# Patient Record
Sex: Male | Born: 1935 | Race: Black or African American | Hispanic: No | Marital: Married | Smoking: Former smoker
Health system: Southern US, Community
[De-identification: ages and names within clinical notes are randomized; demographics above are authoritative.]

## PROBLEM LIST (undated history)

## (undated) DIAGNOSIS — I10 Essential (primary) hypertension: Secondary | ICD-10-CM

## (undated) DIAGNOSIS — E119 Type 2 diabetes mellitus without complications: Secondary | ICD-10-CM

---

## 2007-09-06 ENCOUNTER — Ambulatory Visit: Payer: Self-pay | Admitting: *Deleted

## 2011-05-22 ENCOUNTER — Emergency Department: Payer: Self-pay | Admitting: Unknown Physician Specialty

## 2014-09-30 ENCOUNTER — Ambulatory Visit: Payer: Self-pay | Admitting: Obstetrics and Gynecology

## 2014-10-16 ENCOUNTER — Inpatient Hospital Stay: Admission: EM | Admit: 2014-10-16 | Payer: Medicare Other | Source: Ambulatory Visit | Admitting: Neurosurgery

## 2014-10-16 ENCOUNTER — Inpatient Hospital Stay (HOSPITAL_COMMUNITY): Payer: Medicare HMO | Admitting: Certified Registered"

## 2014-10-16 ENCOUNTER — Inpatient Hospital Stay (HOSPITAL_COMMUNITY)
Admission: EM | Admit: 2014-10-16 | Discharge: 2014-10-24 | DRG: 025 | Disposition: A | Discharge status: Rehabilitation Facility | Payer: Medicare HMO | Source: Other Acute Inpatient Hospital | Attending: Neurosurgery | Admitting: Neurosurgery

## 2014-10-16 ENCOUNTER — Emergency Department: Payer: Self-pay | Admitting: Emergency Medicine

## 2014-10-16 ENCOUNTER — Encounter (HOSPITAL_COMMUNITY)
Admission: EM | Disposition: A | Discharge status: Rehabilitation Facility | Payer: Self-pay | Source: Other Acute Inpatient Hospital | Attending: Neurosurgery

## 2014-10-16 DIAGNOSIS — R4701 Aphasia: Secondary | ICD-10-CM | POA: Diagnosis not present

## 2014-10-16 DIAGNOSIS — R1319 Other dysphagia: Secondary | ICD-10-CM | POA: Diagnosis not present

## 2014-10-16 DIAGNOSIS — Z794 Long term (current) use of insulin: Secondary | ICD-10-CM

## 2014-10-16 DIAGNOSIS — E43 Unspecified severe protein-calorie malnutrition: Secondary | ICD-10-CM | POA: Diagnosis present

## 2014-10-16 DIAGNOSIS — I6200 Nontraumatic subdural hemorrhage, unspecified: Secondary | ICD-10-CM | POA: Diagnosis not present

## 2014-10-16 DIAGNOSIS — E876 Hypokalemia: Secondary | ICD-10-CM | POA: Diagnosis not present

## 2014-10-16 DIAGNOSIS — I10 Essential (primary) hypertension: Secondary | ICD-10-CM | POA: Diagnosis present

## 2014-10-16 DIAGNOSIS — G9340 Encephalopathy, unspecified: Secondary | ICD-10-CM | POA: Diagnosis present

## 2014-10-16 DIAGNOSIS — Z6823 Body mass index (BMI) 23.0-23.9, adult: Secondary | ICD-10-CM

## 2014-10-16 DIAGNOSIS — S065X0S Traumatic subdural hemorrhage without loss of consciousness, sequela: Secondary | ICD-10-CM | POA: Diagnosis not present

## 2014-10-16 DIAGNOSIS — I4891 Unspecified atrial fibrillation: Secondary | ICD-10-CM

## 2014-10-16 DIAGNOSIS — S065X9A Traumatic subdural hemorrhage with loss of consciousness of unspecified duration, initial encounter: Secondary | ICD-10-CM | POA: Diagnosis present

## 2014-10-16 DIAGNOSIS — S065XAA Traumatic subdural hemorrhage with loss of consciousness status unknown, initial encounter: Secondary | ICD-10-CM

## 2014-10-16 DIAGNOSIS — E119 Type 2 diabetes mellitus without complications: Secondary | ICD-10-CM | POA: Diagnosis present

## 2014-10-16 DIAGNOSIS — I481 Persistent atrial fibrillation: Secondary | ICD-10-CM | POA: Diagnosis not present

## 2014-10-16 DIAGNOSIS — D6489 Other specified anemias: Secondary | ICD-10-CM | POA: Insufficient documentation

## 2014-10-16 DIAGNOSIS — E785 Hyperlipidemia, unspecified: Secondary | ICD-10-CM | POA: Diagnosis not present

## 2014-10-16 DIAGNOSIS — R410 Disorientation, unspecified: Secondary | ICD-10-CM | POA: Diagnosis present

## 2014-10-16 DIAGNOSIS — D649 Anemia, unspecified: Secondary | ICD-10-CM | POA: Diagnosis present

## 2014-10-16 HISTORY — PX: CRANIOTOMY: SHX93

## 2014-10-16 LAB — CBC
HEMATOCRIT: 37.9 % — AB (ref 39.0–52.0)
Hemoglobin: 12.8 g/dL — ABNORMAL LOW (ref 13.0–17.0)
MCH: 28.7 pg (ref 26.0–34.0)
MCHC: 33.8 g/dL (ref 30.0–36.0)
MCV: 85 fL (ref 78.0–100.0)
PLATELETS: 247 10*3/uL (ref 150–400)
RBC: 4.46 MIL/uL (ref 4.22–5.81)
RDW: 13.9 % (ref 11.5–15.5)
WBC: 5.8 10*3/uL (ref 4.0–10.5)

## 2014-10-16 LAB — BASIC METABOLIC PANEL
Anion gap: 10 (ref 5–15)
BUN: 16 mg/dL (ref 6–23)
CALCIUM: 8.5 mg/dL (ref 8.4–10.5)
CO2: 21 mmol/L (ref 19–32)
Chloride: 109 mmol/L (ref 96–112)
Creatinine, Ser: 0.8 mg/dL (ref 0.50–1.35)
GFR calc Af Amer: >90 mL/min (ref >90)
GFR calc non Af Amer: 79 mL/min — ABNORMAL LOW (ref >90)
GLUCOSE: 180 mg/dL — AB (ref 70–99)
Potassium: 3.1 mmol/L — ABNORMAL LOW (ref 3.5–5.1)
Sodium: 140 mmol/L (ref 135–145)

## 2014-10-16 LAB — TYPE AND SCREEN
ABO/RH(D): B POS
Antibody Screen: NEGATIVE

## 2014-10-16 LAB — MRSA PCR SCREENING: MRSA by PCR: NEGATIVE

## 2014-10-16 LAB — GLUCOSE, CAPILLARY
GLUCOSE-CAPILLARY: 218 mg/dL — AB (ref 70–99)
GLUCOSE-CAPILLARY: 172 mg/dL — AB (ref 70–99)
GLUCOSE-CAPILLARY: 218 mg/dL — AB (ref 70–99)
Glucose-Capillary: 200 mg/dL — ABNORMAL HIGH (ref 70–99)

## 2014-10-16 LAB — ABO/RH: ABO/RH(D): B POS

## 2014-10-16 SURGERY — CRANIOTOMY HEMATOMA EVACUATION SUBDURAL
Anesthesia: General | Site: Head | Laterality: Bilateral

## 2014-10-16 MED ORDER — HYDRALAZINE HCL 20 MG/ML IJ SOLN
10.0000 mg | INTRAMUSCULAR | Status: DC | PRN
  Administered 2014-10-17 – 2014-10-21 (×3): 20 mg via INTRAVENOUS
  Filled 2014-10-16: qty 2
  Filled 2014-10-16 (×2): qty 1

## 2014-10-16 MED ORDER — MORPHINE SULFATE 2 MG/ML IJ SOLN
1.0000 mg | INTRAMUSCULAR | Status: DC | PRN
  Administered 2014-10-17: 1 mg via INTRAVENOUS
  Filled 2014-10-16: qty 1

## 2014-10-16 MED ORDER — HYDROCHLOROTHIAZIDE 12.5 MG PO CAPS
12.5000 mg | ORAL_CAPSULE | Freq: Every day | ORAL | Status: DC
  Administered 2014-10-17 – 2014-10-21 (×5): 12.5 mg via ORAL
  Filled 2014-10-16 (×5): qty 1

## 2014-10-16 MED ORDER — NEOSTIGMINE METHYLSULFATE 10 MG/10ML IV SOLN
INTRAVENOUS | Status: AC
  Filled 2014-10-16: qty 1

## 2014-10-16 MED ORDER — ONDANSETRON HCL 4 MG/2ML IJ SOLN: 4.0000 mg | INTRAMUSCULAR | Status: DC | PRN

## 2014-10-16 MED ORDER — SODIUM CHLORIDE 0.9 % IJ SOLN
INTRAMUSCULAR | Status: AC
  Filled 2014-10-16: qty 10

## 2014-10-16 MED ORDER — LEVETIRACETAM 500 MG PO TABS
500.0000 mg | ORAL_TABLET | Freq: Two times a day (BID) | ORAL | Status: DC
  Administered 2014-10-16 – 2014-10-24 (×16): 500 mg via ORAL
  Filled 2014-10-16 (×18): qty 1

## 2014-10-16 MED ORDER — PROMETHAZINE HCL 25 MG PO TABS: 12.5000 mg | ORAL_TABLET | ORAL | Status: DC | PRN

## 2014-10-16 MED ORDER — CEFAZOLIN SODIUM 1-5 GM-% IV SOLN
1.0000 g | Freq: Three times a day (TID) | INTRAVENOUS | Status: AC
  Administered 2014-10-16 – 2014-10-17 (×2): 1 g via INTRAVENOUS
  Filled 2014-10-16 (×2): qty 50

## 2014-10-16 MED ORDER — ACETAMINOPHEN 650 MG RE SUPP: 650.0000 mg | RECTAL | Status: DC | PRN

## 2014-10-16 MED ORDER — INSULIN NPH (HUMAN) (ISOPHANE) 100 UNIT/ML ~~LOC~~ SUSP
4.0000 [IU] | Freq: Every day | SUBCUTANEOUS | Status: DC
  Administered 2014-10-17: 4 [IU] via SUBCUTANEOUS
  Filled 2014-10-16: qty 10

## 2014-10-16 MED ORDER — PRAVASTATIN SODIUM 20 MG PO TABS
10.0000 mg | ORAL_TABLET | Freq: Every day | ORAL | Status: DC
  Administered 2014-10-17 – 2014-10-23 (×7): 10 mg via ORAL
  Filled 2014-10-16 (×8): qty 1

## 2014-10-16 MED ORDER — INSULIN ASPART 100 UNIT/ML ~~LOC~~ SOLN
0.0000 [IU] | SUBCUTANEOUS | Status: DC
  Administered 2014-10-16: 3 [IU] via SUBCUTANEOUS
  Administered 2014-10-17: 2 [IU] via SUBCUTANEOUS
  Administered 2014-10-17: 3 [IU] via SUBCUTANEOUS

## 2014-10-16 MED ORDER — NEOSTIGMINE METHYLSULFATE 10 MG/10ML IV SOLN
INTRAVENOUS | Status: DC | PRN
  Administered 2014-10-16: 4 mg via INTRAVENOUS

## 2014-10-16 MED ORDER — FENTANYL CITRATE 0.05 MG/ML IJ SOLN
INTRAMUSCULAR | Status: AC
  Filled 2014-10-16: qty 5

## 2014-10-16 MED ORDER — PROPOFOL 10 MG/ML IV BOLUS
INTRAVENOUS | Status: AC
  Filled 2014-10-16: qty 20

## 2014-10-16 MED ORDER — ONDANSETRON HCL 4 MG/2ML IJ SOLN
INTRAMUSCULAR | Status: AC
  Filled 2014-10-16: qty 2

## 2014-10-16 MED ORDER — INSULIN ASPART 100 UNIT/ML ~~LOC~~ SOLN: 0.0000 [IU] | Freq: Three times a day (TID) | SUBCUTANEOUS | Status: DC

## 2014-10-16 MED ORDER — PROPOFOL 10 MG/ML IV BOLUS
INTRAVENOUS | Status: DC | PRN
  Administered 2014-10-16: 100 mg via INTRAVENOUS

## 2014-10-16 MED ORDER — INSULIN NPH (HUMAN) (ISOPHANE) 100 UNIT/ML ~~LOC~~ SUSP
8.0000 [IU] | Freq: Every day | SUBCUTANEOUS | Status: DC
  Administered 2014-10-16: 8 [IU] via SUBCUTANEOUS
  Filled 2014-10-16: qty 10

## 2014-10-16 MED ORDER — CHLORHEXIDINE GLUCONATE 0.12 % MT SOLN
OROMUCOSAL | Status: AC
  Administered 2014-10-16: 15 mL
  Filled 2014-10-16: qty 15

## 2014-10-16 MED ORDER — AMLODIPINE BESYLATE 10 MG PO TABS
10.0000 mg | ORAL_TABLET | Freq: Every day | ORAL | Status: DC
  Administered 2014-10-17 – 2014-10-24 (×8): 10 mg via ORAL
  Filled 2014-10-16 (×8): qty 1

## 2014-10-16 MED ORDER — BISACODYL 10 MG RE SUPP
10.0000 mg | Freq: Every day | RECTAL | Status: DC | PRN
  Administered 2014-10-18: 10 mg via RECTAL
  Filled 2014-10-16 (×3): qty 1

## 2014-10-16 MED ORDER — FLEET ENEMA 7-19 GM/118ML RE ENEM
1.0000 | ENEMA | Freq: Once | RECTAL | Status: AC | PRN
  Filled 2014-10-16: qty 1

## 2014-10-16 MED ORDER — SUCCINYLCHOLINE CHLORIDE 20 MG/ML IJ SOLN
INTRAMUSCULAR | Status: DC | PRN
  Administered 2014-10-16: 100 mg via INTRAVENOUS

## 2014-10-16 MED ORDER — NICARDIPINE HCL IN NACL 20-0.86 MG/200ML-% IV SOLN
INTRAVENOUS | Status: AC
  Administered 2014-10-16: 5 mg/h
  Filled 2014-10-16: qty 200

## 2014-10-16 MED ORDER — SODIUM CHLORIDE 0.9 % IV SOLN
INTRAVENOUS | Status: DC | PRN
  Administered 2014-10-16: 18:00:00 via INTRAVENOUS

## 2014-10-16 MED ORDER — LIDOCAINE HCL (CARDIAC) 20 MG/ML IV SOLN
INTRAVENOUS | Status: DC | PRN
  Administered 2014-10-16: 100 mg via INTRAVENOUS

## 2014-10-16 MED ORDER — POTASSIUM CHLORIDE 10 MEQ/100ML IV SOLN
10.0000 meq | INTRAVENOUS | Status: AC
  Administered 2014-10-16 – 2014-10-17 (×4): 10 meq via INTRAVENOUS
  Filled 2014-10-16 (×4): qty 100

## 2014-10-16 MED ORDER — ONDANSETRON HCL 4 MG PO TABS: 4.0000 mg | ORAL_TABLET | ORAL | Status: DC | PRN

## 2014-10-16 MED ORDER — LIDOCAINE HCL (CARDIAC) 20 MG/ML IV SOLN
INTRAVENOUS | Status: AC
  Filled 2014-10-16: qty 5

## 2014-10-16 MED ORDER — ARTIFICIAL TEARS OP OINT
TOPICAL_OINTMENT | OPHTHALMIC | Status: DC | PRN
Start: 2014-10-16 — End: 2014-10-16
  Administered 2014-10-16: 1 via OPHTHALMIC

## 2014-10-16 MED ORDER — ROCURONIUM BROMIDE 50 MG/5ML IV SOLN
INTRAVENOUS | Status: AC
  Filled 2014-10-16: qty 2

## 2014-10-16 MED ORDER — PANTOPRAZOLE SODIUM 40 MG IV SOLR
40.0000 mg | Freq: Every day | INTRAVENOUS | Status: DC
  Administered 2014-10-16: 40 mg via INTRAVENOUS
  Filled 2014-10-16 (×2): qty 40

## 2014-10-16 MED ORDER — LISINOPRIL 20 MG PO TABS
20.0000 mg | ORAL_TABLET | Freq: Every day | ORAL | Status: DC
  Administered 2014-10-16 – 2014-10-21 (×6): 20 mg via ORAL
  Filled 2014-10-16 (×6): qty 1

## 2014-10-16 MED ORDER — SODIUM CHLORIDE 0.9 % IV SOLN
INTRAVENOUS | Status: DC
Start: 2014-10-16 — End: 2014-10-24
  Administered 2014-10-17 – 2014-10-22 (×3): via INTRAVENOUS
  Administered 2014-10-23: 1000 mL via INTRAVENOUS

## 2014-10-16 MED ORDER — ROCURONIUM BROMIDE 100 MG/10ML IV SOLN
INTRAVENOUS | Status: DC | PRN
  Administered 2014-10-16: 20 mg via INTRAVENOUS

## 2014-10-16 MED ORDER — POLYETHYLENE GLYCOL 3350 17 G PO PACK
17.0000 g | PACK | Freq: Every day | ORAL | Status: DC | PRN
  Administered 2014-10-18 – 2014-10-23 (×3): 17 g via ORAL
  Filled 2014-10-16 (×5): qty 1

## 2014-10-16 MED ORDER — PHENYLEPHRINE HCL 10 MG/ML IJ SOLN
INTRAMUSCULAR | Status: AC
  Filled 2014-10-16: qty 1

## 2014-10-16 MED ORDER — HYDRALAZINE HCL 10 MG PO TABS
20.0000 mg | ORAL_TABLET | Freq: Two times a day (BID) | ORAL | Status: DC
  Administered 2014-10-16 – 2014-10-24 (×16): 20 mg via ORAL
  Filled 2014-10-16 (×16): qty 2

## 2014-10-16 MED ORDER — BUPIVACAINE HCL (PF) 0.25 % IJ SOLN
INTRAMUSCULAR | Status: DC | PRN
  Administered 2014-10-16: 7.5 mL

## 2014-10-16 MED ORDER — LIDOCAINE HCL (CARDIAC) 20 MG/ML IV SOLN
INTRAVENOUS | Status: AC
  Filled 2014-10-16: qty 10

## 2014-10-16 MED ORDER — POTASSIUM CHLORIDE IN NACL 20-0.9 MEQ/L-% IV SOLN
INTRAVENOUS | Status: DC
  Administered 2014-10-16: 23:00:00 via INTRAVENOUS
  Filled 2014-10-16 (×2): qty 1000

## 2014-10-16 MED ORDER — INSULIN NPH (HUMAN) (ISOPHANE) 100 UNIT/ML ~~LOC~~ SUSP: 4.0000 [IU] | Freq: Every day | SUBCUTANEOUS | Status: DC

## 2014-10-16 MED ORDER — NICARDIPINE HCL IN NACL 20-0.86 MG/200ML-% IV SOLN
INTRAVENOUS | Status: DC | PRN
  Administered 2014-10-16: 5 mg/h via INTRAVENOUS

## 2014-10-16 MED ORDER — DEXAMETHASONE SODIUM PHOSPHATE 10 MG/ML IJ SOLN
INTRAMUSCULAR | Status: AC
  Filled 2014-10-16: qty 1

## 2014-10-16 MED ORDER — LIDOCAINE-EPINEPHRINE 1 %-1:100000 IJ SOLN
INTRAMUSCULAR | Status: DC | PRN
  Administered 2014-10-16: 7.5 mL

## 2014-10-16 MED ORDER — SENNA 8.6 MG PO TABS
1.0000 | ORAL_TABLET | Freq: Two times a day (BID) | ORAL | Status: DC
  Administered 2014-10-16 – 2014-10-24 (×9): 8.6 mg via ORAL
  Filled 2014-10-16 (×17): qty 1

## 2014-10-16 MED ORDER — LABETALOL HCL 5 MG/ML IV SOLN
INTRAVENOUS | Status: DC | PRN
  Administered 2014-10-16: 10 mg via INTRAVENOUS

## 2014-10-16 MED ORDER — METFORMIN HCL 500 MG PO TABS
1000.0000 mg | ORAL_TABLET | Freq: Two times a day (BID) | ORAL | Status: DC
  Administered 2014-10-17 – 2014-10-18 (×3): 1000 mg via ORAL
  Filled 2014-10-16 (×5): qty 2

## 2014-10-16 MED ORDER — INSULIN ASPART 100 UNIT/ML ~~LOC~~ SOLN: 0.0000 [IU] | Freq: Every day | SUBCUTANEOUS | Status: DC

## 2014-10-16 MED ORDER — FENTANYL CITRATE 0.05 MG/ML IJ SOLN
INTRAMUSCULAR | Status: DC | PRN
  Administered 2014-10-16: 50 ug via INTRAVENOUS

## 2014-10-16 MED ORDER — ACETAMINOPHEN 325 MG PO TABS
650.0000 mg | ORAL_TABLET | ORAL | Status: DC | PRN
  Administered 2014-10-17 – 2014-10-22 (×11): 650 mg via ORAL
  Filled 2014-10-16 (×12): qty 2

## 2014-10-16 MED ORDER — ROCURONIUM BROMIDE 50 MG/5ML IV SOLN
INTRAVENOUS | Status: AC
  Filled 2014-10-16: qty 1

## 2014-10-16 MED ORDER — LABETALOL HCL 5 MG/ML IV SOLN: 10.0000 mg | INTRAVENOUS | Status: DC | PRN

## 2014-10-16 MED ORDER — LISINOPRIL-HYDROCHLOROTHIAZIDE 20-12.5 MG PO TABS: 2.0000 | ORAL_TABLET | Freq: Every day | ORAL | Status: DC

## 2014-10-16 MED ORDER — MIDAZOLAM HCL 2 MG/2ML IJ SOLN
INTRAMUSCULAR | Status: AC
  Filled 2014-10-16: qty 2

## 2014-10-16 MED ORDER — THROMBIN 20000 UNITS EX SOLR
CUTANEOUS | Status: DC | PRN
  Administered 2014-10-16: 20 mL via TOPICAL

## 2014-10-16 MED ORDER — GLYCOPYRROLATE 0.2 MG/ML IJ SOLN
INTRAMUSCULAR | Status: DC | PRN
  Administered 2014-10-16: 0.4 mg via INTRAVENOUS

## 2014-10-16 MED ORDER — GLYCOPYRROLATE 0.2 MG/ML IJ SOLN
INTRAMUSCULAR | Status: AC
  Filled 2014-10-16: qty 3

## 2014-10-16 MED ORDER — ARTIFICIAL TEARS OP OINT
TOPICAL_OINTMENT | OPHTHALMIC | Status: AC
  Filled 2014-10-16: qty 3.5

## 2014-10-16 MED ORDER — HYDROCODONE-ACETAMINOPHEN 5-325 MG PO TABS
1.0000 | ORAL_TABLET | ORAL | Status: DC | PRN
  Administered 2014-10-17 – 2014-10-24 (×6): 1 via ORAL
  Filled 2014-10-16 (×6): qty 1

## 2014-10-16 MED ORDER — 0.9 % SODIUM CHLORIDE (POUR BTL) OPTIME
TOPICAL | Status: DC | PRN
  Administered 2014-10-16 (×3): 1000 mL

## 2014-10-16 MED ORDER — CEFAZOLIN SODIUM-DEXTROSE 2-3 GM-% IV SOLR
INTRAVENOUS | Status: DC | PRN
  Administered 2014-10-16: 2 g via INTRAVENOUS

## 2014-10-16 MED ORDER — ONDANSETRON HCL 4 MG/2ML IJ SOLN
INTRAMUSCULAR | Status: AC
  Filled 2014-10-16: qty 4

## 2014-10-16 MED ORDER — DOCUSATE SODIUM 100 MG PO CAPS
100.0000 mg | ORAL_CAPSULE | Freq: Two times a day (BID) | ORAL | Status: DC
  Administered 2014-10-16 – 2014-10-24 (×11): 100 mg via ORAL
  Filled 2014-10-16 (×17): qty 1

## 2014-10-16 SURGICAL SUPPLY — 76 items
BANDAGE GAUZE 4  KLING STR (GAUZE/BANDAGES/DRESSINGS) IMPLANT
BENZOIN TINCTURE PRP APPL 2/3 (GAUZE/BANDAGES/DRESSINGS) IMPLANT
BIT DRILL WIRE PASS 1.3MM (BIT) IMPLANT
BNDG GAUZE ELAST 4 BULKY (GAUZE/BANDAGES/DRESSINGS) IMPLANT
BRUSH SCRUB EZ PLAIN DRY (MISCELLANEOUS) ×3 IMPLANT
BUR ACORN 6.0 PRECISION (BURR) ×2 IMPLANT
BUR ACORN 6.0MM PRECISION (BURR) ×1
BUR ROUTER D-58 CRANI (BURR) IMPLANT
CANISTER SUCT 3000ML PPV (MISCELLANEOUS) ×3 IMPLANT
CATH ROBINSON RED A/P 12FR (CATHETERS) IMPLANT
CLIP TI MEDIUM 6 (CLIP) IMPLANT
CONT SPEC 4OZ CLIKSEAL STRL BL (MISCELLANEOUS) ×3 IMPLANT
DECANTER SPIKE VIAL GLASS SM (MISCELLANEOUS) ×3 IMPLANT
DRAIN JACKSON PRATT 10MM FLAT (MISCELLANEOUS) ×6 IMPLANT
DRAIN PENROSE 1/2X12 LTX STRL (WOUND CARE) IMPLANT
DRAIN SNY WOU 7FLT (WOUND CARE) IMPLANT
DRAPE NEUROLOGICAL W/INCISE (DRAPES) ×3 IMPLANT
DRAPE SURG IRRIG POUCH 19X23 (DRAPES) IMPLANT
DRAPE WARM FLUID 44X44 (DRAPE) ×3 IMPLANT
DRILL WIRE PASS 1.3MM (BIT)
DRSG OPSITE 4X5.5 SM (GAUZE/BANDAGES/DRESSINGS) ×3 IMPLANT
DRSG OPSITE POSTOP 3X4 (GAUZE/BANDAGES/DRESSINGS) ×6 IMPLANT
DRSG PAD ABDOMINAL 8X10 ST (GAUZE/BANDAGES/DRESSINGS) IMPLANT
DRSG TELFA 3X8 NADH (GAUZE/BANDAGES/DRESSINGS) ×3 IMPLANT
DURAPREP 6ML APPLICATOR 50/CS (WOUND CARE) ×3 IMPLANT
ELECT CAUTERY BLADE 6.4 (BLADE) ×3 IMPLANT
ELECT REM PT RETURN 9FT ADLT (ELECTROSURGICAL) ×3
ELECTRODE REM PT RTRN 9FT ADLT (ELECTROSURGICAL) ×1 IMPLANT
EVACUATOR SILICONE 100CC (DRAIN) ×6 IMPLANT
GAUZE SPONGE 4X4 12PLY STRL (GAUZE/BANDAGES/DRESSINGS) IMPLANT
GAUZE SPONGE 4X4 16PLY XRAY LF (GAUZE/BANDAGES/DRESSINGS) IMPLANT
GLOVE BIO SURGEON STRL SZ8 (GLOVE) ×3 IMPLANT
GLOVE BIOGEL PI IND STRL 8 (GLOVE) ×1 IMPLANT
GLOVE BIOGEL PI IND STRL 8.5 (GLOVE) ×1 IMPLANT
GLOVE BIOGEL PI INDICATOR 8 (GLOVE) ×2
GLOVE BIOGEL PI INDICATOR 8.5 (GLOVE) ×2
GLOVE ECLIPSE 7.5 STRL STRAW (GLOVE) ×3 IMPLANT
GLOVE EXAM NITRILE LRG STRL (GLOVE) IMPLANT
GLOVE EXAM NITRILE MD LF STRL (GLOVE) IMPLANT
GLOVE EXAM NITRILE XL STR (GLOVE) IMPLANT
GLOVE EXAM NITRILE XS STR PU (GLOVE) IMPLANT
GOWN STRL REUS W/ TWL LRG LVL3 (GOWN DISPOSABLE) ×1 IMPLANT
GOWN STRL REUS W/ TWL XL LVL3 (GOWN DISPOSABLE) ×1 IMPLANT
GOWN STRL REUS W/TWL 2XL LVL3 (GOWN DISPOSABLE) IMPLANT
GOWN STRL REUS W/TWL LRG LVL3 (GOWN DISPOSABLE) ×2
GOWN STRL REUS W/TWL XL LVL3 (GOWN DISPOSABLE) ×2
HEMOSTAT SURGICEL 2X14 (HEMOSTASIS) ×3 IMPLANT
KIT BASIN OR (CUSTOM PROCEDURE TRAY) ×3 IMPLANT
KIT ROOM TURNOVER OR (KITS) ×3 IMPLANT
NEEDLE HYPO 25X1 1.5 SAFETY (NEEDLE) ×3 IMPLANT
NS IRRIG 1000ML POUR BTL (IV SOLUTION) ×3 IMPLANT
PACK CRANIOTOMY (CUSTOM PROCEDURE TRAY) ×3 IMPLANT
PAD ARMBOARD 7.5X6 YLW CONV (MISCELLANEOUS) ×3 IMPLANT
PATTIES SURGICAL .5 X.5 (GAUZE/BANDAGES/DRESSINGS) IMPLANT
PATTIES SURGICAL .5 X3 (DISPOSABLE) IMPLANT
PATTIES SURGICAL 1X1 (DISPOSABLE) IMPLANT
PIN MAYFIELD SKULL DISP (PIN) IMPLANT
PLATE 1.5  2HOLE LNG NEURO (Plate) ×12 IMPLANT
PLATE 1.5 2HOLE LNG NEURO (Plate) ×6 IMPLANT
SCREW SELF DRILL HT 1.5/4MM (Screw) ×36 IMPLANT
SPECIMEN JAR SMALL (MISCELLANEOUS) IMPLANT
SPONGE NEURO XRAY DETECT 1X3 (DISPOSABLE) IMPLANT
SPONGE SURGIFOAM ABS GEL 12-7 (HEMOSTASIS) IMPLANT
STAPLER SKIN PROX WIDE 3.9 (STAPLE) ×3 IMPLANT
SUT ETHILON 3 0 FSL (SUTURE) ×6 IMPLANT
SUT ETHILON 3 0 PS 1 (SUTURE) IMPLANT
SUT NURALON 4 0 TR CR/8 (SUTURE) ×6 IMPLANT
SUT VIC AB 2-0 CP2 18 (SUTURE) ×6 IMPLANT
SUT VIC AB 3-0 SH 8-18 (SUTURE) IMPLANT
SYR CONTROL 10ML LL (SYRINGE) ×3 IMPLANT
TOWEL OR 17X24 6PK STRL BLUE (TOWEL DISPOSABLE) ×3 IMPLANT
TOWEL OR 17X26 10 PK STRL BLUE (TOWEL DISPOSABLE) ×3 IMPLANT
TRAP SPECIMEN MUCOUS 40CC (MISCELLANEOUS) IMPLANT
TRAY FOLEY CATH 14FRSI W/METER (CATHETERS) IMPLANT
UNDERPAD 30X30 INCONTINENT (UNDERPADS AND DIAPERS) IMPLANT
WATER STERILE IRR 1000ML POUR (IV SOLUTION) ×3 IMPLANT

## 2014-10-16 NOTE — Progress Notes (Signed)
Awake, alert, follows commands briskly.  Speaking with nurse, not with me.  Son says "this is normal for Dad."  Doing well following drainage of bilateral subdural hematomas.

## 2014-10-16 NOTE — Consult Note (Signed)
PULMONARY / CRITICAL CARE MEDICINE   Name: Mason Stevens MRN: [id number] DOB: 04/16/1927    ADMISSION DATE:  10/16/2014 CONSULTATION DATE:  10/16/2014  REFERRING MD :  [facility] ED  CHIEF COMPLAINT:  SDH  INITIAL PRESENTATION:  81 y.o. M [facility] for dizziness and falling to his R side.  In ED, found to have bilateral SDH and was [facility]. He underwent evacuation later that afternoon Venetia Maxon(Stern) and was extubated in PACU before returning to ICU.  PCCM consulted for medical management..     STUDIES:  MRI Head 2/18 >>> Bilateral SDH (Going off OR notes as [facility], error message each time).  SIGNIFICANT EVENTS: 2/18 - [facility] with [facility], found to have B SDH, transferred to [facility] and underwent B craniotomy and evacuation of hematoma in OR   HISTORY OF PRESENT ILLNESS:  Pt is encephalopathic; therefore, this HPI is obtained from chart review. Mason [name] Arora is a 81 y.o. M who [facility] for dizziness and falling towards his R side x 1 month.  He had also been experiencing some slurred speech and diffuse weakness.  In ED, he was found to have bilateral SDH and was [facility].  Later that afternoon, he was taken to the OR and had bilateral craniotomy and evacuation of SDH (Dr. Venetia MaxonStern).  Following the procedure, he was successfully extubated in the PACU before returning to neuro ICU. PCCM was consulted for medical management.  PAST MEDICAL HISTORY :  HTN, HLD, DM, ? A-fib.   Prior to Admission medications   Medication Sig Start Date End Date Taking? Authorizing Provider  amLODipine (NORVASC) 10 MG tablet Take 10 mg by mouth daily.   Yes Historical Provider, MD  hydrALAZINE (APRESOLINE) 10 MG tablet Take 20 mg by mouth 2 (two) times daily.   Yes Historical Provider, MD  insulin NPH Human (HUMULIN N,NOVOLIN N) 100 UNIT/ML injection  Inject 4-8 Units into the skin daily. Take 4 units every morning Take 8 units every evening   Yes Historical Provider, MD  lisinopril-hydrochlorothiazide (PRINZIDE,ZESTORETIC) 20-12.5 MG per tablet Take 2 tablets by mouth daily.   Yes Historical Provider, MD  lovastatin (MEVACOR) 20 MG tablet Take 40 mg by mouth at bedtime.   Yes Historical Provider, MD  metFORMIN (GLUCOPHAGE) 1000 MG tablet Take 1,000 mg by mouth 2 (two) times daily with a meal.   Yes Historical Provider, MD   No Known Allergies  FAMILY HISTORY:  No family history on file.  SOCIAL HISTORY:  has no tobacco, alcohol, and drug history on file.  REVIEW OF SYSTEMS:  Unable to obtain as pt is encephalopathic.  SUBJECTIVE:   VITAL SIGNS: Temp:  [97.3 F (36.3 C)-98.3 F (36.8 C)] 97.3 F (36.3 C) (02/18 1952) Pulse Rate:  [45-103] 71 (02/18 1952) Resp:  [14-19] 18 (02/18 1952) BP: (117-134)/(48-73) 134/67 mmHg (02/18 1700) SpO2:  [96 %-100 %] 100 % (02/18 1952) Arterial Line BP: (169)/(61) 169/61 mmHg (02/18 1700) HEMODYNAMICS:   VENTILATOR SETTINGS:   INTAKE / OUTPUT: Intake/Output      02/18 0701 - 02/19 0700   I.V. 1111.8   Total Intake 1111.8   Urine 150   Total Output 150   Net +961.8       Urine Occurrence 1 x     PHYSICAL EXAMINATION: General: Elderly chronically ill appearing male, in NAD. Neuro: Opens eyes, MAE's, does not  follow commands. HEENT: Bilateral scalp dressings and drains in place and C/D/I.   PERRL, sclerae anicteric. Cardiovascular: IRIR, no M/R/G.  Lungs: Respirations shallow and unlabored.  CTA bilaterally, No W/R/R. Abdomen: BS x 4, soft, NT/ND.  Musculoskeletal: No gross deformities, no edema.  Skin: Intact, warm, no rashes.  LABS:  CBC  Recent Labs Lab 10/16/14 1625  WBC 5.8  HGB 12.8*  HCT 37.9*  PLT 247   Coag's No results for input(s): APTT, INR in the last 168 hours. BMET  Recent Labs Lab 10/16/14 1625  NA 140  K 3.1*  CL 109  CO2 21  BUN 16   CREATININE 0.80  GLUCOSE 180*   Electrolytes  Recent Labs Lab 10/16/14 1625  CALCIUM 8.5   Sepsis Markers No results for input(s): LATICACIDVEN, PROCALCITON, O2SATVEN in the last 168 hours. ABG No results for input(s): PHART, PCO2ART, PO2ART in the last 168 hours. Liver Enzymes No results for input(s): AST, ALT, ALKPHOS, BILITOT, ALBUMIN in the last 168 hours. Cardiac Enzymes No results for input(s): TROPONINI, PROBNP in the last 168 hours. Glucose  Recent Labs Lab 10/16/14 1551  GLUCAP 218*    Imaging   ASSESSMENT / PLAN:  PULMONARY A: At risk post op atelectasis P:   Pulmonary hygiene. Incentive spirometry.  CARDIOVASCULAR L radial Aline 2/18 >>> A:  A.fib - rate controlled.  Unclear if chronic or not, not on any medications as outpatient Strict BP control Hx HTN, HLD. P:  Hold anticoagulation given recent surgical intervention. SBP goal < 160. Hydralazine PRN. Hold outpatient amlodipine, hydralazine, lovastatin.  RENAL A:   Hypokalemia P:   Potassium repletion x 4 runs. NS @ 75. BMP in AM.  GASTROINTESTINAL A:   Nutrition P:   NPO for now. SLP swallow eval in AM.  HEMATOLOGIC A:   Anemia VTE Prophylaxis P:  Transfuse per usual ICU guidelines. SCD's only. CBC in AM.  INFECTIOUS A:   No indication for infection P:   Monitor clinically.  ENDOCRINE A:   DM P:   CBG's q4hr. SSI.  NEUROLOGIC A:   Bilateral SDH s/p bilateral craniotomy and evacuation 2/18 P:   Post op care per neurosurgery. PT consult in AM.   Family updated: None.  Interdisciplinary Family Meeting v Palliative Care Meeting:  Due by: 2/24.  CC time: 35 minutes.   Rutherford Guys, [place] [facility] [place] [facility] Pager: [phone]  or [phone] 10/16/2014, 8:19 PM

## 2014-10-16 NOTE — Anesthesia Procedure Notes (Signed)
Procedure Name: Intubation Date/Time: 10/16/2014 6:42 PM Performed by: Izora Gala Pre-anesthesia Checklist: Patient identified, Emergency Drugs available, Suction available and Patient being monitored Patient Re-evaluated:Patient Re-evaluated prior to inductionOxygen Delivery Method: Circle system utilized Intubation Type: IV induction Ventilation: Mask ventilation without difficulty Laryngoscope Size: Miller and 3 Grade View: Grade II Tube type: Oral Tube size: 7.5 mm Number of attempts: 1 Airway Equipment and Method: Stylet and LTA kit utilized Placement Confirmation: ETT inserted through vocal cords under direct vision,  positive ETCO2 and breath sounds checked- equal and bilateral Secured at: 23 cm Tube secured with: Tape Dental Injury: Teeth and Oropharynx as per pre-operative assessment

## 2014-10-16 NOTE — Progress Notes (Signed)
Notified Dr. Venetia MaxonStern re BP. Order to go by A-line and to treat BP over 160.

## 2014-10-16 NOTE — Brief Op Note (Signed)
10/16/2014  7:48 PM  PATIENT:  Mason Stevens  81 y.o. male  PRE-OPERATIVE DIAGNOSIS:  Bilateral Subdural Hematoma  POST-OPERATIVE DIAGNOSIS:  Bilateral Subdural Hematoma  PROCEDURE:  Procedure(s) with comments: BILATERALCRANIOTOMY HEMATOMA EVACUATION SUBDURAL (Bilateral) - BILATERALCRANIOTOMY HEMATOMA EVACUATION SUBDURAL  SURGEON:  Surgeon(s) and Role:    * Maeola HarmanJoseph Timmy Bubeck, MD - Primary  PHYSICIAN ASSISTANT:   ASSISTANTS: none   ANESTHESIA:   general  EBL:     BLOOD ADMINISTERED:none  DRAINS: (10) Jackson-Pratt drain(s) with closed bulb suction in the subdural space bilaterally   LOCAL MEDICATIONS USED:  LIDOCAINE   SPECIMEN:  No Specimen  DISPOSITION OF SPECIMEN:  N/A  COUNTS:  YES  TOURNIQUET:  * No tourniquets in log *  DICTATION: DICTATION: Patient is 81 year old man who has developed bilateral subdural hematomas. He has had progressively worsening headache and CT shows bilateral subdural hematoma with mass effect. It was elected to take patient to surgery for bilateral craniotomies for SDH.  Procedure: Following smooth intubation, patient was placed in brow up position. Head was placed on donut head holder and bi-frontal scalp was shaved and prepped and draped in usual sterile fashion. Area of planned incision was infiltrated with lidocaine. A ilinear incision was made and carried through temporalis fascia and muscle to expose calvarium on each side of his head. Skull flaps was elevated exposing subdural hematoma. Dura was opened and subdural was evacuated. The subdurals were large and under pressure. Both subdural cavities were irrigated with saline until significantly clearer.  Hemostasis was assured. The brain was considerably more relaxed after hematoma evacuation. Bilateral #10 JP drains were placed and anchored with nylon sutures. Bone flaps was replaced with plates, the fascia and galea were closed with 2-0 vicryl sutures and the skin was re approximated with  staples. Sterile occlusive dressings were placed. Patient was returned to a supine position and transferred to the ICU in stable and satisfactory condition. Counts were correct at the end of the case.   PLAN OF CARE: Admit to inpatient   PATIENT DISPOSITION:  PACU - hemodynamically stable.   Delay start of Pharmacological VTE agent (>24hrs) due to surgical blood loss or risk of bleeding: yes

## 2014-10-16 NOTE — Op Note (Signed)
10/16/2014  7:48 PM  PATIENT:  Mason Stevens  81 y.o. male  PRE-OPERATIVE DIAGNOSIS:  Bilateral Subdural Hematoma  POST-OPERATIVE DIAGNOSIS:  Bilateral Subdural Hematoma  PROCEDURE:  Procedure(s) with comments: BILATERALCRANIOTOMY HEMATOMA EVACUATION SUBDURAL (Bilateral) - BILATERALCRANIOTOMY HEMATOMA EVACUATION SUBDURAL  SURGEON:  Surgeon(s) and Role:    * Trueman Worlds, MD - Primary  PHYSICIAN ASSISTANT:   ASSISTANTS: none   ANESTHESIA:   general  EBL:     BLOOD ADMINISTERED:none  DRAINS: (10) Jackson-Pratt drain(s) with closed bulb suction in the subdural space bilaterally   LOCAL MEDICATIONS USED:  LIDOCAINE   SPECIMEN:  No Specimen  DISPOSITION OF SPECIMEN:  N/A  COUNTS:  YES  TOURNIQUET:  * No tourniquets in log *  DICTATION: DICTATION: Patient is 81 year old man who has developed bilateral subdural hematomas. He has had progressively worsening headache and CT shows bilateral subdural hematoma with mass effect. It was elected to take patient to surgery for bilateral craniotomies for SDH.  Procedure: Following smooth intubation, patient was placed in brow up position. Head was placed on donut head holder and bi-frontal scalp was shaved and prepped and draped in usual sterile fashion. Area of planned incision was infiltrated with lidocaine. A ilinear incision was made and carried through temporalis fascia and muscle to expose calvarium on each side of his head. Skull flaps was elevated exposing subdural hematoma. Dura was opened and subdural was evacuated. The subdurals were large and under pressure. Both subdural cavities were irrigated with saline until significantly clearer.  Hemostasis was assured. The brain was considerably more relaxed after hematoma evacuation. Bilateral #10 JP drains were placed and anchored with nylon sutures. Bone flaps was replaced with plates, the fascia and galea were closed with 2-0 vicryl sutures and the skin was re approximated with  staples. Sterile occlusive dressings were placed. Patient was returned to a supine position and transferred to the ICU in stable and satisfactory condition. Counts were correct at the end of the case.   PLAN OF CARE: Admit to inpatient   PATIENT DISPOSITION:  PACU - hemodynamically stable.   Delay start of Pharmacological VTE agent (>24hrs) due to surgical blood loss or risk of bleeding: yes  

## 2014-10-16 NOTE — Transfer of Care (Signed)
Immediate Anesthesia Transfer of Care Note  Patient: Mason Stevens  Procedure(s) Performed: Procedure(s) with comments: BILATERALCRANIOTOMY HEMATOMA EVACUATION SUBDURAL (Bilateral) - BILATERALCRANIOTOMY HEMATOMA EVACUATION SUBDURAL  Patient Location: PACU  Anesthesia Type:General  Level of Consciousness: awake, patient cooperative and confused  Airway & Oxygen Therapy: Patient Spontanous Breathing and Patient connected to face mask oxygen  Post-op Assessment: Report given to RN, Post -op Vital signs reviewed and stable, Patient moving all extremities, Patient moving all extremities X 4 and Patient able to stick tongue midline  Post vital signs: Reviewed and stable  Last Vitals:  Filed Vitals:   10/16/14 1700  BP: 134/67  Pulse: 45  Temp:   Resp: 16    Complications: No apparent anesthesia complications

## 2014-10-16 NOTE — H&P (Signed)
Reason for Admission:Confusion and bilateral subdural hematomas Referring Physician: Irena ReichmannYao  Mason Stevens is an 81 y.o. male.  HPI: 3 week history of progressively worsening confusion, falling and balance problems.  Patient quite confused this morning and family called EMS and [facility].  Son has noticed progressively worsening confusion and aphasia.  Patient has been independent until very recently and has fallen and hit his head on multiple occasions.  No past medical history on file.  No past surgical history on file.  No family history on file.  Social History:  has no tobacco, alcohol, and drug history on file.  Allergies: No Known Allergies  Medications: I have reviewed the patient's current medications.  Results for orders placed or performed during the hospital encounter of 10/16/14 (from the past 48 hour(s))  MRSA PCR Screening     Status: None   Collection Time: 10/16/14 12:45 PM  Result Value Ref Range   MRSA by PCR NEGATIVE NEGATIVE    Comment:        The GeneXpert MRSA Assay (FDA approved for NASAL specimens only), is one component of a comprehensive MRSA colonization surveillance program. It is not intended to diagnose MRSA infection nor to guide or monitor treatment for MRSA infections.   Glucose, capillary     Status: Abnormal   Collection Time: 10/16/14  3:51 PM  Result Value Ref Range   Glucose-Capillary 218 (H) 70 - 99 mg/dL  CBC     Status: Abnormal   Collection Time: 10/16/14  4:25 PM  Result Value Ref Range   WBC 5.8 4.0 - 10.5 K/uL   RBC 4.46 4.22 - 5.81 MIL/uL   Hemoglobin 12.8 (L) 13.0 - 17.0 g/dL   HCT 40.937.9 (L) 81.139.0 - 91.452.0 %   MCV 85.0 78.0 - 100.0 fL   MCH 28.7 26.0 - 34.0 pg   MCHC 33.8 30.0 - 36.0 g/dL   RDW 78.213.9 95.611.5 - 21.315.5 %   Platelets 247 150 - 400 K/uL    No results found.  Review of Systems - Negative except diabetes, new onset A Fib    Blood pressure 117/48, pulse 90, temperature 98.3 F (36.8 C),  temperature source Oral, resp. rate 17, SpO2 99 %. Physical Exam  Constitutional: He appears well-developed and well-nourished. He appears listless.  HENT:  Head: Normocephalic and atraumatic.  Eyes: Conjunctivae and EOM are normal. Pupils are equal, round, and reactive to light.  Neck: Normal range of motion. Neck supple.  Cardiovascular: Normal heart sounds.   Respiratory: Effort normal and breath sounds normal.  GI: Soft. Bowel sounds are normal.  Musculoskeletal: Normal range of motion.  Neurological: He has normal strength. He appears listless. No cranial nerve deficit or sensory deficit. Coordination abnormal. GCS eye subscore is 4. GCS verbal subscore is 4. GCS motor subscore is 6.    Assessment/Plan: Patient has large bilateral subdural hematomasw with worsening confusion and new onset A Fib. Emergent bilateral craniotomies for SDH.  I discussed situation with patient and his family.  Mason Stevens,Shatyra Becka D, MD 10/16/2014, 5:17 PM

## 2014-10-16 NOTE — Anesthesia Preprocedure Evaluation (Addendum)
Anesthesia Evaluation  Patient identified by MRN, date of birth, ID band Patient confused    Reviewed: Allergy & Precautions, H&P , NPO status , Patient's Chart, lab work & pertinent test results  Airway Mallampati: II  TM Distance: >3 FB Neck ROM: Full    Dental no notable dental hx. (+) Teeth Intact, Dental Advisory Given   Pulmonary neg pulmonary ROS,  breath sounds clear to auscultation  Pulmonary exam normal       Cardiovascular hypertension, On Medications Atrial Fibrillation Rhythm:Irregular Rate:Normal     Neuro/Psych negative neurological ROS  negative psych ROS   GI/Hepatic negative GI ROS, Neg liver ROS,   Endo/Other  Type 2, Oral Hypoglycemic Agents  Renal/GU negative Renal ROS  negative genitourinary   Musculoskeletal negative musculoskeletal ROS (+)   Abdominal   Peds negative pediatric ROS (+)  Hematology negative hematology ROS (+)   Anesthesia Other Findings   Reproductive/Obstetrics negative OB ROS                            Anesthesia Physical Anesthesia Plan  ASA: III and emergent  Anesthesia Plan: General   Post-op Pain Management:    Induction: Intravenous  Airway Management Planned: Oral ETT  Additional Equipment: Arterial line  Intra-op Plan:   Post-operative Plan: Extubation in OR and Possible Post-op intubation/ventilation  Informed Consent: I have reviewed the patients History and Physical, chart, labs and discussed the procedure including the risks, benefits and alternatives for the proposed anesthesia with the patient or authorized representative who has indicated his/her understanding and acceptance.   Dental advisory given  Plan Discussed with: CRNA  Anesthesia Plan Comments:        Anesthesia Quick Evaluation

## 2014-10-16 NOTE — Anesthesia Postprocedure Evaluation (Signed)
  Anesthesia Post-op Note  Patient: Mason Stevens  Procedure(s) Performed: Procedure(s) with comments: BILATERALCRANIOTOMY HEMATOMA EVACUATION SUBDURAL (Bilateral) - BILATERALCRANIOTOMY HEMATOMA EVACUATION SUBDURAL  Patient Location: ICU  Anesthesia Type:General  Level of Consciousness: awake and alert   Airway and Oxygen Therapy: Patient Spontanous Breathing  Post-op Pain: none  Post-op Assessment: Post-op Vital signs reviewed  Post-op Vital Signs: Reviewed  Last Vitals:  Filed Vitals:   10/16/14 2017  BP: 104/85  Pulse: 51  Temp:   Resp: 23    Complications: No apparent anesthesia complications

## 2014-10-17 ENCOUNTER — Inpatient Hospital Stay (HOSPITAL_COMMUNITY): Payer: Medicare HMO

## 2014-10-17 ENCOUNTER — Encounter (HOSPITAL_COMMUNITY): Payer: Self-pay | Admitting: *Deleted

## 2014-10-17 DIAGNOSIS — I4891 Unspecified atrial fibrillation: Secondary | ICD-10-CM

## 2014-10-17 DIAGNOSIS — E43 Unspecified severe protein-calorie malnutrition: Secondary | ICD-10-CM | POA: Insufficient documentation

## 2014-10-17 LAB — CBC
HEMATOCRIT: 34.5 % — AB (ref 39.0–52.0)
Hemoglobin: 11.6 g/dL — ABNORMAL LOW (ref 13.0–17.0)
MCH: 29.1 pg (ref 26.0–34.0)
MCHC: 33.6 g/dL (ref 30.0–36.0)
MCV: 86.5 fL (ref 78.0–100.0)
PLATELETS: 230 10*3/uL (ref 150–400)
RBC: 3.99 MIL/uL — AB (ref 4.22–5.81)
RDW: 14 % (ref 11.5–15.5)
WBC: 6.7 10*3/uL (ref 4.0–10.5)

## 2014-10-17 LAB — GLUCOSE, CAPILLARY
GLUCOSE-CAPILLARY: 199 mg/dL — AB (ref 70–99)
GLUCOSE-CAPILLARY: 179 mg/dL — AB (ref 70–99)
GLUCOSE-CAPILLARY: 163 mg/dL — AB (ref 70–99)
Glucose-Capillary: 132 mg/dL — ABNORMAL HIGH (ref 70–99)
Glucose-Capillary: 133 mg/dL — ABNORMAL HIGH (ref 70–99)
Glucose-Capillary: 204 mg/dL — ABNORMAL HIGH (ref 70–99)
Glucose-Capillary: 194 mg/dL — ABNORMAL HIGH (ref 70–99)

## 2014-10-17 LAB — BASIC METABOLIC PANEL
Anion gap: 6 (ref 5–15)
BUN: 16 mg/dL (ref 6–23)
CHLORIDE: 111 mmol/L (ref 96–112)
CO2: 22 mmol/L (ref 19–32)
Calcium: 8.3 mg/dL — ABNORMAL LOW (ref 8.4–10.5)
Creatinine, Ser: 0.9 mg/dL (ref 0.50–1.35)
GFR calc Af Amer: 87 mL/min — ABNORMAL LOW (ref >90)
GFR calc non Af Amer: 75 mL/min — ABNORMAL LOW (ref >90)
Glucose, Bld: 156 mg/dL — ABNORMAL HIGH (ref 70–99)
POTASSIUM: 3.6 mmol/L (ref 3.5–5.1)
Sodium: 139 mmol/L (ref 135–145)

## 2014-10-17 LAB — TSH: TSH: 0.592 u[IU]/mL (ref 0.350–4.500)

## 2014-10-17 LAB — TROPONIN I: Troponin I: 0.03 ng/mL (ref <0.031)

## 2014-10-17 LAB — MAGNESIUM: MAGNESIUM: 1.8 mg/dL (ref 1.5–2.5)

## 2014-10-17 LAB — PHOSPHORUS: PHOSPHORUS: 2.4 mg/dL (ref 2.3–4.6)

## 2014-10-17 MED ORDER — POTASSIUM CHLORIDE CRYS ER 20 MEQ PO TBCR
40.0000 meq | EXTENDED_RELEASE_TABLET | Freq: Once | ORAL | Status: AC
  Administered 2014-10-17: 40 meq via ORAL
  Filled 2014-10-17: qty 2

## 2014-10-17 MED ORDER — MAGNESIUM SULFATE IN D5W 10-5 MG/ML-% IV SOLN
1.0000 g | Freq: Once | INTRAVENOUS | Status: AC
  Administered 2014-10-17: 1 g via INTRAVENOUS
  Filled 2014-10-17: qty 100

## 2014-10-17 MED ORDER — INSULIN ASPART 100 UNIT/ML ~~LOC~~ SOLN
0.0000 [IU] | Freq: Every day | SUBCUTANEOUS | Status: DC
Start: 2014-10-17 — End: 2014-10-24
  Administered 2014-10-19: 2 [IU] via SUBCUTANEOUS
  Administered 2014-10-20: 5 [IU] via SUBCUTANEOUS
  Administered 2014-10-21 – 2014-10-22 (×2): 2 [IU] via SUBCUTANEOUS

## 2014-10-17 MED ORDER — PANTOPRAZOLE SODIUM 40 MG PO TBEC
40.0000 mg | DELAYED_RELEASE_TABLET | Freq: Every day | ORAL | Status: DC
  Administered 2014-10-17 – 2014-10-24 (×8): 40 mg via ORAL
  Filled 2014-10-17 (×8): qty 1

## 2014-10-17 MED ORDER — INSULIN ASPART 100 UNIT/ML ~~LOC~~ SOLN
0.0000 [IU] | Freq: Three times a day (TID) | SUBCUTANEOUS | Status: DC
  Administered 2014-10-17 – 2014-10-18 (×3): 3 [IU] via SUBCUTANEOUS
  Administered 2014-10-18: 5 [IU] via SUBCUTANEOUS
  Administered 2014-10-18: 3 [IU] via SUBCUTANEOUS
  Administered 2014-10-19 (×2): 2 [IU] via SUBCUTANEOUS
  Administered 2014-10-19: 8 [IU] via SUBCUTANEOUS
  Administered 2014-10-20: 3 [IU] via SUBCUTANEOUS
  Administered 2014-10-20: 5 [IU] via SUBCUTANEOUS
  Administered 2014-10-20: 2 [IU] via SUBCUTANEOUS
  Administered 2014-10-21: 3 [IU] via SUBCUTANEOUS
  Administered 2014-10-21 (×2): 5 [IU] via SUBCUTANEOUS
  Administered 2014-10-22 (×3): 3 [IU] via SUBCUTANEOUS
  Administered 2014-10-23: 2 [IU] via SUBCUTANEOUS
  Administered 2014-10-23 (×2): 3 [IU] via SUBCUTANEOUS
  Administered 2014-10-24: 2 [IU] via SUBCUTANEOUS
  Administered 2014-10-24: 5 [IU] via SUBCUTANEOUS
  Administered 2014-10-24: 3 [IU] via SUBCUTANEOUS

## 2014-10-17 NOTE — Progress Notes (Signed)
PT Cancellation Note  Patient Details Name: Tamala [name] Pigford MRN: [id number] DOB: 01-15-1927   Cancelled Treatment:    Reason Eval/Treat Not Completed: Patient not medically ready.  Pt currently with bedrest order and PT order with start date/time of 10/17/14 at 2044.  Please update activity and PT order if pt is appropriate for mobility today.  Will f/u as appropriate.     Kristia Jupiter, Alison MurrayMegan F 10/17/2014, 7:57 AM

## 2014-10-17 NOTE — Progress Notes (Signed)
Patient R side JP drain not holding to suction. Physician notified. Will continue to monitor.

## 2014-10-17 NOTE — Progress Notes (Signed)
INITIAL NUTRITION ASSESSMENT  DOCUMENTATION CODES Per approved criteria  -Severe malnutrition in the context of acute illness or injury   INTERVENTION: Magic cup TID, each supplement provides 290 kcal and 9 grams of protein   NUTRITION DIAGNOSIS: Malnutrition related to acute illness as evidenced by severe fat and muscle depletion.   Goal: Pt to meet >/= 90% of their estimated nutrition needs   Monitor:  PO intake, supplement acceptance, weight trends, labs  Reason for Assessment: Pt identified as at nutrition risk on the Malnutrition Screen Tool  ASSESSMENT: Pt admitted after 3 week of progressively worsening confusion and aphasia, pt had been independent until recently, with bilateral SDH. Pt s/p bilateral craniotomies with hematoma evacuation.  Pt confused and unable to answer questions at this time.  No height, weighed pt on bed scale.  Pt meets criteria for malnutrition, it would appear this is acute over the last 3 weeks.   Nutrition Focused Physical Exam:  Subcutaneous Fat:  Orbital Region: WDL Upper Arm Region: severe depletion Thoracic and Lumbar Region: severe depletion  Muscle:  Temple Region: severe depletion Clavicle Bone Region: severe depletion Clavicle and Acromion Bone Region: severe depletion Scapular Bone Region: severe depletion Dorsal Hand: WDL Patellar Region: severe depletion Anterior Thigh Region: severe depletion Posterior Calf Region: severe depletion  Edema: not present     Height: Ht Readings from Last 1 Encounters:  No data found for Ht    Weight: Wt Readings from Last 1 Encounters:  10/17/14 173 lb 8 oz (78.7 kg)    Ideal Body Weight: unknown  % Ideal Body Weight: -  Wt Readings from Last 10 Encounters:  10/17/14 173 lb 8 oz (78.7 kg)    Usual Body Weight: unknown  % Usual Body Weight: -  BMI:  There is no height on file to calculate BMI.  Estimated Nutritional Needs: Kcal: 1950-2150 Protein: 100-115 grams Fluid:  > 1.9 L/day  Skin: incision  Diet Order: Diet regular  EDUCATION NEEDS: -No education needs identified at this time   Intake/Output Summary (Last 24 hours) at 10/17/14 1442 Last data filed at 10/17/14 1300  Gross per 24 hour  Intake   2850 ml  Output    800 ml  Net   2050 ml    Last BM: PTA   Labs:   Recent Labs Lab 10/16/14 1625 10/17/14 0525  NA 140 139  K 3.1* 3.6  CL 109 111  CO2 21 22  BUN 16 16  CREATININE 0.80 0.90  CALCIUM 8.5 8.3*  MG  --  1.8  PHOS  --  2.4  GLUCOSE 180* 156*    CBG (last 3)   Recent Labs  10/17/14 0918 10/17/14 1138 10/17/14 1346  GLUCAP 133* 204* 179*    Scheduled Meds: . amLODipine  10 mg Oral Daily  . docusate sodium  100 mg Oral BID  . hydrALAZINE  20 mg Oral BID  . lisinopril  20 mg Oral Daily   And  . hydrochlorothiazide  12.5 mg Oral Daily  . insulin aspart  0-15 Units Subcutaneous TID WC  . insulin aspart  0-5 Units Subcutaneous QHS  . levETIRAcetam  500 mg Oral BID  . metFORMIN  1,000 mg Oral BID WC  . pantoprazole  40 mg Oral Q1200  . pravastatin  10 mg Oral q1800  . senna  1 tablet Oral BID    Continuous Infusions: . sodium chloride 75 mL/hr at 10/17/14 1107    History reviewed. No pertinent past medical  history.  History reviewed. No pertinent past surgical history.  Kendell BaneHeather Kipper Buch RD, LDN, CNSC [phone] Pager [phone] After Hours Pager

## 2014-10-17 NOTE — Progress Notes (Signed)
Inpatient Diabetes Program Recommendations  AACE/ADA: New Consensus Statement on Inpatient Glycemic Control (2013)  Target Ranges:  Prepandial:   less than 140 mg/dL      Peak postprandial:   less than 180 mg/dL (1-2 hours)      Critically ill patients:  140 - 180 mg/dL   Please change diet to carb modified.  May want to re-consider adding NPH back into inpatient routine- fasting blood sugar this am was 132mg /dl and patient had received his home dose of 8 units NPH at hs.   Could continue Novolog correction as well but decrease it to the sensitive scale tid with meals and hs.  May want to consider ordering an A1C to determine control over the past 2-3 months.    Susette RacerJulie Vivek Grealish, RN, BA, MHA, CDE Diabetes Coordinator Inpatient Diabetes Program  [phone] (Team Pager) [phone] Patrcia Dolly([place] [facility]) 10/17/2014 12:04 PM

## 2014-10-17 NOTE — Progress Notes (Signed)
  Echocardiogram 2D Echocardiogram has been performed.  Arvil ChacoFoster, Valaree Fresquez 10/17/2014, 4:48 PM

## 2014-10-17 NOTE — Evaluation (Signed)
Speech Language Pathology Evaluation Patient Details Name: Mason [name] Hausman MRN: [id number] DOB: 26-Mar-1927 Today's Date: 10/17/2014 Time: 1013-1030 SLP Time Calculation (min) (ACUTE ONLY): 17 min  Problem List:  Patient Active Problem List   Diagnosis Date Noted  . Subdural hematoma 10/16/2014  . Atrial fibrillation 10/16/2014  . Type 2 diabetes mellitus without complication   . Anemia due to other cause    Past Medical History: History reviewed. No pertinent past medical history. Past Surgical History: History reviewed. No pertinent past surgical history. HPI:  81 yr old admitted  with 3 week history of progressively worsening confusion, falling and balance problems. Per MD documentationson has noticed progressively worsening confusion and aphasia.Patient has been independent until very recently (lives with wife) and has fallen and hit his head on multiple occasions. No past medical history on file. Imaging revealed large bilateral subdural hematoma.   Assessment / Plan / Recommendation Clinical Impression  Pt's cognition and expressive language with drastic improvements per son's since surgery last night. Speech is mostly fluent with occasional hesitancies and decreased semantics requiring extra time and attempts. Followed 2 step commands without difficulty. Pt began experiencing increased headache, therefore reading, writing and higher level cognition not assess currently. ST to continue communicative-cognitive facilitation.     SLP Assessment  Patient needs continued Speech Lanaguage Pathology Services    Follow Up Recommendations  Inpatient Rehab    Frequency and Duration min 2x/week  2 weeks   Pertinent Vitals/Pain Pain Assessment:  (headache, RN gave pain  meds)   SLP Goals  Patient/Family Stated Goal: [place] in [place] per family Potential to Achieve Goals (ACUTE ONLY): Good  SLP Evaluation Prior Functioning  Cognitive/Linguistic Baseline: Within  functional limits Type of Home: House  Lives With: Spouse Vocation: Retired Consulting civil engineer(farmer)   Cognition  Overall Cognitive Status: Impaired/Different from baseline Arousal/Alertness: Awake/alert Orientation Level: Oriented to person;Oriented to place;Oriented to time;Oriented to situation Attention: Sustained Sustained Attention: Appears intact Memory: Appears intact (require only 1 semantic cue for 4 word recall) Awareness: Appears intact Problem Solving:  (basic functiona, will assess higher level) Safety/Judgment: Appears intact [employer] Scales of Cognitive Functioning: Automatic/appropriate    Comprehension  Auditory Comprehension Overall Auditory Comprehension: Appears within functional limits for tasks assessed Visual Recognition/Discrimination Discrimination: Not tested Reading Comprehension Reading Status:  (did not assess due to increasing headache)    Expression Expression Primary Mode of Expression: Verbal Verbal Expression Overall Verbal Expression: Impaired Initiation: No impairment Level of Generative/Spontaneous Verbalization: Conversation Repetition: No impairment Naming: No impairment Pragmatics: No impairment Other Verbal Expression Comments: decreased semantics intermittent, needs extra time Written Expression Dominant Hand: Right Written Expression:  (will assess, increasing headache presently)   Oral / Motor Oral Motor/Sensory Function Overall Oral Motor/Sensory Function: Appears within functional limits for tasks assessed Motor Speech Overall Motor Speech: Impaired Respiration: Within functional limits Phonation: Low vocal intensity Resonance: Within functional limits Articulation: Within functional limitis Intelligibility:  (intelligible, may have difficulty in noisy environments) Motor Planning: Witnin functional limits   GO     [name], Marielis Samara Willis 10/17/2014, 11:01 AM   [name] M.Ed [employer]P Pager [phone]

## 2014-10-17 NOTE — Progress Notes (Signed)
PULMONARY / CRITICAL CARE MEDICINE   Name: Berl Bonfanti MRN: [id number] DOB: 07/24/1927    ADMISSION DATE:  10/16/2014 CONSULTATION DATE:  10/17/2014  REFERRING MD :  [facility] ED  CHIEF COMPLAINT:  SDH  INITIAL PRESENTATION:  81 y.o. M brought to [facility] ED 2/18 for dizziness and falling to his R side.  In ED, found to have bilateral SDH and was subsequently transferred to [facility] for surgical intervention. He underwent evacuation later that afternoon Venetia Maxon) and was extubated in PACU before returning to ICU.  PCCM consulted for medical management..     STUDIES:  MRI Head 2/18 >>> Bilateral SDH (Going off OR notes as imaging from [facility] not loading on [facility] computers, error message each time).  SIGNIFICANT EVENTS: 2/18 - presented to [facility] with dizziness, found to have B SDH, transferred to [facility] and underwent B craniotomy and evacuation of hematoma in OR 2/19 - slow a fib  SUBJECTIVE:  Denies chest pain, dyspnea, palpitations, nausea, abdominal pain.  VITAL SIGNS: Temp:  [97.1 F (36.2 C)-99.4 F (37.4 C)] 98.5 F (36.9 C) (02/19 0741) Pulse Rate:  [45-103] 67 (02/19 0930) Resp:  [9-27] 22 (02/19 0930) BP: (104-174)/(16-125) 149/60 mmHg (02/19 0930) SpO2:  [90 %-100 %] 99 % (02/19 0930) Arterial Line BP: (91-210)/(37-155) 136/58 mmHg (02/19 0930) INTAKE / OUTPUT: Intake/Output      02/18 0701 - 02/19 0700 02/19 0701 - 02/20 0700   P.O. 360    I.V. 1731.8    Other 70    IV Piggyback 450    Total Intake 2611.8     Urine 750    Drains 75    Total Output 825     Net +1786.8          Urine Occurrence 2 x      PHYSICAL EXAMINATION: General: pleasant Neuro: alert, normal strength HEENT: b/l JP drains Cardiovascular: irregular Lungs: no wheeze Abdomen: soft, non tender Musculoskeletal: No edema Skin: no rashes  LABS:  CBC  Recent Labs Lab 10/16/14 1625 10/17/14 0525  WBC 5.8 6.7  HGB 12.8* 11.6*  HCT 37.9* 34.5*  PLT 247 230    BMET  Recent Labs Lab  10/16/14 1625 10/17/14 0525  NA 140 139  K 3.1* 3.6  CL 109 111  CO2 21 22  BUN 16 16  CREATININE 0.80 0.90  GLUCOSE 180* 156*   Electrolytes  Recent Labs Lab 10/16/14 1625 10/17/14 0525  CALCIUM 8.5 8.3*  MG  --  1.8  PHOS  --  2.4   Liver Enzymes No results for input(s): AST, ALT, ALKPHOS, BILITOT, ALBUMIN in the last 168 hours.   Cardiac Enzymes No results for input(s): TROPONINI, PROBNP in the last 168 hours.   Glucose  Recent Labs Lab 10/16/14 2005 10/16/14 2218 10/16/14 2347 10/17/14 0318 10/17/14 0739 10/17/14 0918  GLUCAP 172* 218* 200* 199* 132* 133*    Imaging   ASSESSMENT / PLAN:  B/l traumatic SDH s/p b/l craniotomies. Plan: Post-op care, AED's per neurosurgery  New onset A fib >> low heart rate. Hx of HTN, HLD. Plan: F/u Echo, ECG, cardiac enzymes, TSH Monitor hemodynamics Not candidate for ASA, anticoagulation in setting of SDH Continue amlodipine, hydralazine, HCTZ, lisinopril, pravachol (uses lovastatin as outpt) SBP goal < 160 Hydralazine PRN   Hypokalemia. Hypomagnesemia. P:   Goal K > 4, Mg > 2  DM type II. P:   Resume metformin SSI  Updated pt's son at bedside.  Coralyn Helling, MD [facility] Pulmonary/Critical Care 10/17/2014, 10:28  AM Pager:  [phone] After 3pm call: [phone]

## 2014-10-17 NOTE — Progress Notes (Signed)
Pt R side JP drain still not holding to suction. Insertion site has some bleeding. Dressing marked for new drainage. Physician notified. Will continue to monitor.

## 2014-10-17 NOTE — Evaluation (Signed)
Clinical/Bedside Swallow Evaluation Patient Details  Name: Mason Stevens MRN: [id number] Date of Birth: [birth year]  Today's Date: Stevens Time: SLP Start Time (ACUTE ONLY): 0959 SLP Stop Time (ACUTE ONLY): 1012 SLP Time Calculation (min) (ACUTE ONLY): 13 min  Past Medical History: History reviewed. No pertinent past medical history. Past Surgical History: History reviewed. No pertinent past surgical history. HPI:  81 yr old admitted  with 3 week history of progressively worsening confusion, falling and balance problems. Per MD documentationson has noticed progressively worsening confusion and aphasia.Patient has been independent until very recently (lives with wife) and has fallen and hit his head on multiple occasions. No past medical history on file. Imaging revealed large bilateral subdural hematoma.   Assessment / Plan / Recommendation Clinical Impression  Pt observed consuming pills with water via straw with one slight throat clear (not concerning given variying textures simultaneously in addition to straw). Functional mastication for solid texture. Recommmend continue regular texture and thin liquids, strawsl allowed and pills with liquid (if difficulty, remove straw or place pill in applesauce). No follow up needed for swallow.    Aspiration Risk   (mild-mod)    Diet Recommendation Regular;Thin liquid   Liquid Administration via: Cup;Straw Medication Administration: Whole meds with liquid Supervision: Patient able to self feed;Intermittent supervision to cue for compensatory strategies Compensations: Slow rate;Small sips/bites Postural Changes and/or Swallow Maneuvers: Seated upright 90 degrees    Other  Recommendations Oral Care Recommendations: Oral care BID   Follow Up Recommendations  Inpatient Rehab (family prefers [place] per son)    Frequency and Duration        Pertinent Vitals/Pain Headache, RN administered pain meds         Swallow  Study          Oral/Motor/Sensory Function Overall Oral Motor/Sensory Function: Appears within functional limits for tasks assessed   Ice Chips Ice chips: Not tested   Thin Liquid Thin Liquid: Within functional limits Presentation: Straw    Nectar Thick Nectar Thick Liquid: Not tested   Honey Thick Honey Thick Liquid: Not tested   Puree Puree: Not tested   Solid   GO    Solid: Within functional limits       Mason Stevens, Mason Stevens Stevens,10:37 AM   Mason Stevens Mason Stevens Mason Stevens(Mason Stevens

## 2014-10-17 NOTE — Progress Notes (Signed)
Subjective: Patient reports "I need to apologise to the doctor for not talking last night"  "I just feel some pressure"  Objective: Vital signs in last 24 hours: Temp:  [97.1 F (36.2 C)-99.4 F (37.4 C)] 98.4 F (36.9 C) (02/19 0354) Pulse Rate:  [45-103] 75 (02/19 0700) Resp:  [9-27] 26 (02/19 0700) BP: (104-174)/(16-125) 126/64 mmHg (02/19 0700) SpO2:  [90 %-100 %] 100 % (02/19 0700) Arterial Line BP: (91-210)/(37-86) 91/69 mmHg (02/19 0700)  Intake/Output from previous day: 02/18 0701 - 02/19 0700 In: 2611.8 [P.O.:360; I.V.:1731.8; IV Piggyback:450] Out: 825 [Urine:750; Drains:75] Intake/Output this shift:    Alert, conversant. PEARL. No drift. MAEW. Son at bedside. JP's patent, bloody drainage. Right JP loses suction - pulled back slightly to seal against skin, new drsg applied. No drainage or swelling at stapled incisions.   Lab Results:  Recent Labs  10/16/14 1625 10/17/14 0525  WBC 5.8 6.7  HGB 12.8* 11.6*  HCT 37.9* 34.5*  PLT 247 230   BMET  Recent Labs  10/16/14 1625 10/17/14 0525  NA 140 139  K 3.1* 3.6  CL 109 111  CO2 21 22  GLUCOSE 180* 156*  BUN 16 16  CREATININE 0.80 0.90  CALCIUM 8.5 8.3*    Studies/Results: No results found.  Assessment/Plan: Improving   LOS: 1 day  Repositioned right JP drain on DrStern's order, redressed; will reassess.    Georgiann Cockeroteat, Melanye Hiraldo 10/17/2014, 7:41 AM

## 2014-10-18 ENCOUNTER — Encounter (HOSPITAL_COMMUNITY): Payer: Self-pay | Admitting: Neurosurgery

## 2014-10-18 DIAGNOSIS — I480 Paroxysmal atrial fibrillation: Secondary | ICD-10-CM

## 2014-10-18 DIAGNOSIS — E43 Unspecified severe protein-calorie malnutrition: Secondary | ICD-10-CM

## 2014-10-18 LAB — CBC
HCT: 38.3 % — ABNORMAL LOW (ref 39.0–52.0)
Hemoglobin: 13 g/dL (ref 13.0–17.0)
MCH: 29.1 pg (ref 26.0–34.0)
MCHC: 33.9 g/dL (ref 30.0–36.0)
MCV: 85.7 fL (ref 78.0–100.0)
PLATELETS: 206 10*3/uL (ref 150–400)
RBC: 4.47 MIL/uL (ref 4.22–5.81)
RDW: 14.3 % (ref 11.5–15.5)
WBC: 6.9 10*3/uL (ref 4.0–10.5)

## 2014-10-18 LAB — URINALYSIS, ROUTINE W REFLEX MICROSCOPIC
BILIRUBIN URINE: NEGATIVE
Glucose, UA: >1000 mg/dL — AB
HGB URINE DIPSTICK: NEGATIVE
Ketones, ur: 15 mg/dL — AB
Leukocytes, UA: NEGATIVE
Nitrite: NEGATIVE
PH: 6.5 (ref 5.0–8.0)
Protein, ur: NEGATIVE mg/dL
Specific Gravity, Urine: 1.018 (ref 1.005–1.030)
UROBILINOGEN UA: 0.2 mg/dL (ref 0.0–1.0)

## 2014-10-18 LAB — GLUCOSE, CAPILLARY
GLUCOSE-CAPILLARY: 203 mg/dL — AB (ref 70–99)
GLUCOSE-CAPILLARY: 162 mg/dL — AB (ref 70–99)
Glucose-Capillary: 148 mg/dL — ABNORMAL HIGH (ref 70–99)
Glucose-Capillary: 169 mg/dL — ABNORMAL HIGH (ref 70–99)

## 2014-10-18 LAB — TROPONIN I: Troponin I: 0.03 ng/mL (ref <0.031)

## 2014-10-18 LAB — COMPREHENSIVE METABOLIC PANEL
ALBUMIN: 2.8 g/dL — AB (ref 3.5–5.2)
ALK PHOS: 65 U/L (ref 39–117)
ALT: 8 U/L (ref 0–53)
AST: 23 U/L (ref 0–37)
Anion gap: 4 — ABNORMAL LOW (ref 5–15)
BUN: 15 mg/dL (ref 6–23)
CHLORIDE: 110 mmol/L (ref 96–112)
CO2: 22 mmol/L (ref 19–32)
Calcium: 8 mg/dL — ABNORMAL LOW (ref 8.4–10.5)
Creatinine, Ser: 0.86 mg/dL (ref 0.50–1.35)
GFR calc Af Amer: 89 mL/min — ABNORMAL LOW (ref >90)
GFR calc non Af Amer: 77 mL/min — ABNORMAL LOW (ref >90)
Glucose, Bld: 188 mg/dL — ABNORMAL HIGH (ref 70–99)
Potassium: 3.7 mmol/L (ref 3.5–5.1)
Sodium: 136 mmol/L (ref 135–145)
Total Bilirubin: 0.9 mg/dL (ref 0.3–1.2)
Total Protein: 5.9 g/dL — ABNORMAL LOW (ref 6.0–8.3)

## 2014-10-18 LAB — URINE MICROSCOPIC-ADD ON

## 2014-10-18 LAB — MAGNESIUM: Magnesium: 1.8 mg/dL (ref 1.5–2.5)

## 2014-10-18 MED ORDER — TAMSULOSIN HCL 0.4 MG PO CAPS
0.4000 mg | ORAL_CAPSULE | Freq: Every day | ORAL | Status: DC
  Administered 2014-10-18 – 2014-10-22 (×5): 0.4 mg via ORAL
  Filled 2014-10-18 (×5): qty 1

## 2014-10-18 NOTE — Progress Notes (Addendum)
PULMONARY / CRITICAL CARE MEDICINE   Name: Mason Stevens MRN: [id number] DOB: September 26, 1926    ADMISSION DATE:  10/16/2014 CONSULTATION DATE:  10/18/2014  REFERRING [facility] :  [facility]  CHIEF COMPLAINT:  SDH  INITIAL PRESENTATION:  81 y.[facility]. M [facility] for dizziness and falling to his R side.  In ED, found to have bilateral SDH and [facility]. He underwent evacuation later that afternoon Venetia Maxon(Stern) and was extubated in PACU before returning to ICU.  PCCM consulted for medical management..    STUDIES:  MRI Head 2/18 >>> Bilateral SDH (Going off OR notes as [facility], error message each time). ECHO 2/19: - Left ventricle: The cavity size was mildly dilated. Wallthickness was normal. The estimated ejection fraction was 55%.Wall motion was normal; there were no regional wall motionabnormalities.- Aorta: Mild dilitation of the aortic root. Aortic root dimension: 41 mm (ED).- Left atrium: The atrium was moderately to severely dilated.- Right ventricle: The cavity size was normal. Systolic function was normal.- Right atrium: The atrium was mildly dilated.  SIGNIFICANT EVENTS: 2/18 - [facility], found to have B SDH, [facility] and underwent B craniotomy and evacuation of hematoma in OR 2/19 - slow a fib  SUBJECTIVE: no distress  VITAL SIGNS: Temp:  [98 F (36.7 C)-99.1 F (37.3 C)] 98 F (36.7 C) (02/20 1200) Pulse Rate:  [47-143] 57 (02/20 1100) Resp:  [12-30] 30 (02/20 1100) BP: (129-172)/(50-91) 151/75 mmHg (02/20 1100) SpO2:  [95 %-100 %] 99 % (02/20 1100) Weight:  [78.7 kg (173 lb 8 oz)] 78.7 kg (173 lb 8 oz) (02/19 1300) INTAKE / OUTPUT: Intake/Output      02/19 0701 - 02/20 0700 02/20 0701 - 02/21 0700   P.O. 480 240   I.V. (mL/kg) 1350 (17.2) 300 (3.8)   Other     IV Piggyback 100    Total Intake(mL/kg) 1930 (24.5) 540 (6.9)   Urine (mL/kg/hr) 1875  (1)    Drains 49 (0)    Total Output 1924     Net +6 +540        Urine Occurrence 1 x      PHYSICAL EXAMINATION: General: pleasant Neuro: alert, normal strength HEENT: b/l JP drains Cardiovascular: irregular Lungs: no wheeze Abdomen: soft, non tender Musculoskeletal: No edema Skin: no rashes  LABS:  CBC  Recent Labs Lab 10/16/14 1625 10/17/14 0525 10/18/14 0507  WBC 5.8 6.7 6.9  HGB 12.8* 11.6* 13.0  HCT 37.9* 34.5* 38.3*  PLT 247 230 206    BMET  Recent Labs Lab 10/16/14 1625 10/17/14 0525 10/18/14 0507  NA 140 139 136  K 3.1* 3.6 3.7  CL 109 111 110  CO2 21 22 22   BUN 16 16 15   CREATININE 0.80 0.90 0.86  GLUCOSE 180* 156* 188*   Electrolytes  Recent Labs Lab 10/16/14 1625 10/17/14 0525 10/18/14 0507  CALCIUM 8.5 8.3* 8.0*  MG  --  1.8 1.8  PHOS  --  2.4  --    Liver Enzymes  Recent Labs Lab 10/18/14 0507  AST 23  ALT 8  ALKPHOS 65  BILITOT 0.9  ALBUMIN 2.8*     Cardiac Enzymes  Recent Labs Lab 10/17/14 1726 10/17/14 2306 10/18/14 0507  TROPONINI 0.03 <0.03 0.03     Glucose  Recent Labs Lab 10/17/14 1138 10/17/14 1346 10/17/14 1726 10/17/14 2212 10/18/14 0809 10/18/14 1211  GLUCAP 204* 179*  163* 194* 203* 162*    Imaging  ASSESSMENT / PLAN:  B/l traumatic SDH s/p b/l craniotomies. Plan: Post-op care, AED's per neurosurgery  New onset A fib >> low heart rate. Hx of HTN, HLD. NML EF, NML trops  Plan: Monitor hemodynamics Not candidate for ASA, anticoagulation in setting of SDH Continue amlodipine, hydralazine, HCTZ, lisinopril, pravachol (uses lovastatin as outpt) If tachy can change norvasc to cardizem SBP goal < 160 Hydralazine PRN   DM type II. P:   SSI  Looks great. Can go to tele when Drains out.   Simonne Martinet ACNP-BC [facility] Pulmonary/Critical Care Pager # [phone] OR # [phone] if no answer  STAFF NOTE: I, Rory Percy, MD FACP have personally reviewed patient's available data,  including medical history, events of note, physical examination and test results as part of my evaluation. I have discussed with resident/NP and other care providers such as pharmacist, RN and RRT. In addition, I personally evaluated patient and elicited key findings of: Will watch clsoley heart rate response, can substitute hydral or Norvasc for BB or cardizem if needed, no anticoagulation , keep on tele, assess lytes and maintain normal K , mag, would NOT use metformin in hospital as of now, SSi maintain, if tachy then hydral should be dc as cause reflext tachy especially in setting fib He has large left atrium likely provoking fib ion past, Am concerned that his fall risk in month would be too high still for anticoagulation  Mcarthur Rossetti. Tyson Alias, MD, FACP Pgr: [phone] [place] Pulmonary & Critical Care 10/18/2014 1:38 PM

## 2014-10-18 NOTE — Progress Notes (Signed)
PT Cancellation Note  Patient Details Name: Tamala [name] Giron MRN: [id number] DOB: [birth year]   Cancelled Treatment:    Reason Eval/Treat Not Completed: Patient not medically ready.  Pt continues to be on bedrest at this time.  Please advance activity order when pt is appropriate for mobility.  Will continue to follow as appropriate.     Kinzlie Harney, Alison MurrayMegan F 10/18/2014, 8:13 AM

## 2014-10-18 NOTE — Progress Notes (Signed)
Patient ID: Mason [name] Brummond, male   DOB: [birth year], 81 y.o.   MRN: [id number] Afeb, vss No new neuro issues Speech is clear and fluent. He follows complex commands. Drains working well. Will keep drains in for the weekend per Dr Venetia MaxonStern.

## 2014-10-19 LAB — GLUCOSE, CAPILLARY
GLUCOSE-CAPILLARY: 254 mg/dL — AB (ref 70–99)
GLUCOSE-CAPILLARY: 202 mg/dL — AB (ref 70–99)
Glucose-Capillary: 151 mg/dL — ABNORMAL HIGH (ref 70–99)
Glucose-Capillary: 136 mg/dL — ABNORMAL HIGH (ref 70–99)

## 2014-10-19 NOTE — Progress Notes (Signed)
PT Cancellation Note  Patient Details Name: Mason [name] MRN: [id number] DOB: [birth year]   Cancelled Treatment:    Reason Eval/Treat Not Completed: Patient not medically ready.  Pt is still on bedrest. Please update activity order, when appropriate, for PT to proceed with PT eval.   Ilda FoilGarrow, Boen Sterbenz Rene 10/19/2014, 2:28 PM

## 2014-10-19 NOTE — Progress Notes (Signed)
Patient ID: Mason Stevens Cohrs, male   DOB: Dec 05, 1926, 81 y.o.   MRN: [id number] Afeb, vss No new neuro issues Drains continue to put out fluid. He remains awake and alert and conversant [place]. Will recheck scan tomorrow am

## 2014-10-20 ENCOUNTER — Inpatient Hospital Stay (HOSPITAL_COMMUNITY): Payer: Medicare HMO

## 2014-10-20 DIAGNOSIS — S065X1S Traumatic subdural hemorrhage with loss of consciousness of 30 minutes or less, sequela: Secondary | ICD-10-CM

## 2014-10-20 DIAGNOSIS — I481 Persistent atrial fibrillation: Secondary | ICD-10-CM

## 2014-10-20 LAB — GLUCOSE, CAPILLARY
GLUCOSE-CAPILLARY: 363 mg/dL — AB (ref 70–99)
Glucose-Capillary: 158 mg/dL — ABNORMAL HIGH (ref 70–99)
Glucose-Capillary: 206 mg/dL — ABNORMAL HIGH (ref 70–99)
Glucose-Capillary: 148 mg/dL — ABNORMAL HIGH (ref 70–99)

## 2014-10-20 NOTE — Progress Notes (Signed)
Subjective: Patient reports feeling better  Objective: Vital signs in last 24 hours: Temp:  [97.2 F (36.2 C)-100.5 F (38.1 C)] 99.6 F (37.6 C) (02/22 0722) Pulse Rate:  [35-109] 70 (02/22 0900) Resp:  [11-30] 24 (02/22 0900) BP: (111-148)/(33-85) 139/66 mmHg (02/22 0900) SpO2:  [91 %-100 %] 98 % (02/22 0900) Weight:  [74.4 kg (164 lb 0.4 oz)] 74.4 kg (164 lb 0.4 oz) (02/22 0722)  Intake/Output from previous day: 02/21 0701 - 02/22 0700 In: 2360 [P.O.:1160; I.V.:1200] Out: 3224 [Urine:3190; Drains:34] Intake/Output this shift:    Physical Exam: Awake, alert, conversant.  MAEW. No headache.  Rapid, spontaneous speech.  Lab Results:  Recent Labs  10/18/14 0507  WBC 6.9  HGB 13.0  HCT 38.3*  PLT 206   BMET  Recent Labs  10/18/14 0507  NA 136  K 3.7  CL 110  CO2 22  GLUCOSE 188*  BUN 15  CREATININE 0.86  CALCIUM 8.0*    Studies/Results: Ct Head Wo Contrast  10/20/2014   CLINICAL DATA:  Follow-up subdural hematoma status post craniotomy. Subsequent encounter.  EXAM: CT HEAD WITHOUT CONTRAST  TECHNIQUE: Contiguous axial images were obtained from the base of the skull through the vertex without intravenous contrast.  COMPARISON:  MR head 10/16/2014.  FINDINGS: BILATERAL frontal craniotomies were performed for subdural hematoma evacuation. BILATERAL Jackson-Pratt drains lie within the subdural space. Minor pneumocephalus is seen anteriorly.  Considerable residual subdural fluid is present. On the RIGHT, this is relatively hypoattenuating, 15-20 HU, consistent with residual chronic subdural hematoma and/or hygroma. On the LEFT, there is considerable residual hyperdense material notably over the parietal convexity measuring between 45 and 65 HU suggesting subacute or acute hemorrhage. Maximum thickness is 23 mm RIGHT and 19 mm LEFT.  The brain is squeezed centrally with effacement of the cortical sulci. There is no midline shift or hydrocephalus. No parenchymal hemorrhage  is present. 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 Scalp soft tissues reflect previous BILATERAL craniotomy but are otherwise unremarkable. No sinus or mastoid disease.  IMPRESSION: Residual/recurrent BILATERAL subdural hematomata, slightly larger on the RIGHT but with more acute to subacute blood products on the LEFT.   Electronically Signed   By: Mason BellingJohn  Stevens M.D.   On: 10/20/2014 07:07    Assessment/Plan: Improving clinically.  Residual SDH on CT, but overall dimensions improved.  Preop thickness was >3 cm bilaterally, now 2.3cm.  There is some subacute blood on left, will try to mobilize this with positioning of patient, although may have septations.  D/W son.  Will involve PT and Rehab.  Repeat Head CT in am.    LOS: 4 days    Mason Stevens,Mason Stevens D, MD 10/20/2014, 9:17 AM

## 2014-10-20 NOTE — Evaluation (Signed)
Physical Therapy Evaluation Patient Details Name: Mason Stevens MRN: [id number] DOB: [birth year] Today's Date: 10/20/2014   History of Present Illness  pt presents with Bil SDH s/p Bil Crani.  pt with recent hx of multiple falls.    Clinical Impression  Pt very eager for mobility and follows directions well.  Mobility limited to OOB to chair as activity order states.  Feel pt would benefit from CIR at D/C to maximize independence prior to returning to home.  Will continue to follow.      Follow Up Recommendations CIR    Equipment Recommendations  Rolling walker with 5" wheels    Recommendations for Other Services Rehab consult     Precautions / Restrictions Precautions Precautions: Fall Precaution Comments: pt with Bil JP drains from Bil Crani incisions.   Restrictions Weight Bearing Restrictions: No Other Position/Activity Restrictions: Current order for L side-lying or up to chair only.        Mobility  Bed Mobility Overal bed mobility: Needs Assistance Bed Mobility: Supine to Sit     Supine to sit: Min assist     General bed mobility comments: pt moves slowly and only needs A for bringing trunk up to sitting.    Transfers Overall transfer level: Needs assistance Equipment used: 1 person hand held assist Transfers: Sit to/from [employer] CorporationStand;Stand Pivot Transfers Sit to Stand: Min assist Stand pivot transfers: Min assist       General transfer comment: pt mildly unsteady and needs A for balance.  cues for safe technique.    Ambulation/Gait                Stairs            Wheelchair Mobility    Modified Rankin (Stroke Patients Only)       Balance Overall balance assessment: Needs assistance Sitting-balance support: No upper extremity supported;Feet supported Sitting balance-Leahy Scale: Fair     Standing balance support: Single extremity supported Standing balance-Leahy Scale: Poor                               Pertinent  Vitals/Pain Pain Assessment: No/denies pain    Home Living Family/patient expects to be discharged to:: Inpatient rehab                      Prior Function Level of Independence: Independent         Comments: pt indicates recently had been given a cane and Rollator after his falls.       Hand Dominance   Dominant Hand: Right    Extremity/Trunk Assessment   Upper Extremity Assessment: Defer to OT evaluation           Lower Extremity Assessment: Generalized weakness      Cervical / Trunk Assessment: Normal  Communication   Communication: No difficulties  Cognition Arousal/Alertness: Awake/alert Behavior During Therapy: WFL for tasks assessed/performed Overall Cognitive Status: Impaired/Different from baseline Area of Impairment: Awareness;Problem solving;Safety/judgement         Safety/Judgement: Decreased awareness of deficits Awareness: Emergent Problem Solving: Difficulty sequencing;Slow processing;Requires verbal cues General Comments: pt cognition functional for basic mobility tasks, but has difficulty with multitasking and problem solving.      General Comments      Exercises        Assessment/Plan    PT Assessment Patient needs continued PT services  PT Diagnosis Difficulty walking;Generalized weakness   PT Problem List  Decreased strength;Decreased activity tolerance;Decreased balance;Decreased mobility;Decreased coordination;Decreased cognition;Decreased knowledge of use of DME;Decreased safety awareness  PT Treatment Interventions DME instruction;Gait training;Functional mobility training;Therapeutic activities;Therapeutic exercise;Balance training;Neuromuscular re-education;Patient/family education;Cognitive remediation   PT Goals (Current goals can be found in the Care Plan section) Acute Rehab PT Goals Patient Stated Goal: Get home with his wife. PT Goal Formulation: With patient Time For Goal Achievement: 11/03/14 Potential to  Achieve Goals: Good    Frequency Min 3X/week   Barriers to discharge        Co-evaluation               End of Session Equipment Utilized During Treatment: Gait belt Activity Tolerance: Patient tolerated treatment well Patient left: in chair;with call bell/phone within reach Nurse Communication: Mobility status         Time: 1610-9604 PT Time Calculation (min) (ACUTE ONLY): 15 min   Charges:   PT Evaluation $Initial PT Evaluation Tier I: 1 Procedure     PT G CodesSunny Stevens, [name] [phone] 10/20/2014, 1:39 PM

## 2014-10-20 NOTE — Consult Note (Signed)
Physical Medicine and Rehabilitation Consult Reason for Consult:SDH Referring Physician: Dr.Stern   HPI: Mason Stevens is a 81 y.o. right handed male with history of hypertension, diabetes mellitus and peripheral neuropathy. Admitted 10/16/2014 with frequent falls as well as altered mental status. [facility]. Cranial CT scan showed large bilateral subdural hematomas. Patient was transferred to [facility] and underwent bilateral craniotomy hematoma evacuation of subdural hematoma 10/16/2014 per Dr. Venetia MaxonStern. Placed on Keppra for seizure prophylaxis. Plan follow cranial CT scan pending after subdural hematoma evacuation. Patient with new onset atrial fibrillation. Echocardiogram with ejection fraction of 55% no wall motion abnormalities. Troponin negative. Tolerating a regular consistency diet. Await formal physical and occupational therapy evaluations. M.D. has requested physical medicine rehabilitation consult.   Review of Systems  Gastrointestinal: Positive for constipation.  Musculoskeletal: Positive for falls.  Neurological: Positive for headaches.  All other systems reviewed and are negative.  History reviewed. No pertinent past medical history. Past Surgical History  Procedure Laterality Date  . Craniotomy Bilateral 10/16/2014    Procedure: BILATERALCRANIOTOMY HEMATOMA EVACUATION SUBDURAL;  Surgeon: Maeola HarmanJoseph Stern, MD;  Location: [facility];  Service: Neurosurgery;  Laterality: Bilateral;  BILATERALCRANIOTOMY HEMATOMA EVACUATION SUBDURAL   History reviewed. No pertinent family history. Social History:  has no tobacco, alcohol, and drug history on file. Allergies: No Active Allergies Medications Prior to Admission  Medication Sig Dispense Refill  . amLODipine (NORVASC) 10 MG tablet Take 10 mg by mouth daily.    . hydrALAZINE (APRESOLINE) 10 MG tablet Take 20 mg by mouth 2 (two) times daily.    . insulin NPH Human (HUMULIN  N,NOVOLIN N) 100 UNIT/ML injection Inject 4-8 Units into the skin daily. Take 4 units every morning Take 8 units every evening    . lisinopril-hydrochlorothiazide (PRINZIDE,ZESTORETIC) 20-12.5 MG per tablet Take 2 tablets by mouth daily.    Marland Kitchen. lovastatin (MEVACOR) 20 MG tablet Take 40 mg by mouth at bedtime.    . metFORMIN (GLUCOPHAGE) 1000 MG tablet Take 1,000 mg by mouth 2 (two) times daily with a meal.      Home: Home Living Family/patient expects to be discharged to:: Private residence Living Arrangements: Alone Type of Home: House  Lives With: Spouse  Functional History:   Functional Status:  Mobility:          ADL:    Cognition: Cognition Overall Cognitive Status: Impaired/Different from baseline Arousal/Alertness: Awake/alert Orientation Level: Oriented X4 Attention: Sustained Sustained Attention: Appears intact Memory: Appears intact (require only 1 semantic cue for 4 word recall) Awareness: Appears intact Problem Solving:  (basic functiona, will assess higher level) Safety/Judgment: Appears intact Ranch[employer] MirantLos Amigos Scales of Cognitive Functioning: Automatic/appropriate Cognition Overall Cognitive Status: Impaired/Different from baseline  Blood pressure 139/66, pulse 70, temperature 99.6 F (37.6 C), temperature source Oral, resp. rate 24, height 5\' 10"  (1.778 m), weight 74.4 kg (164 lb 0.4 oz), SpO2 98 %. Physical Exam  Constitutional: He is oriented to person, place, and time.  Eyes: EOM are normal.  Neck: Normal range of motion. Neck supple. No thyromegaly present.  Cardiovascular:  Cardiac rate controlled  Respiratory: Effort normal and breath sounds normal. No respiratory distress.  GI: Soft. Bowel sounds are normal. He exhibits no distension.  Neurological: He is alert and oriented to person, place, and time.  Follows commands. Fair awareness of deficits. Alert. Moves all 4's. Sitting upright in chair.   Skin:  Craniotomy site dressed.Drain remains in  place  Psychiatric:  Fairly pleasant and appropriate    Results for orders placed or performed during the hospital encounter of 10/16/14 (from the past 24 hour(s))  Glucose, capillary     Status: Abnormal   Collection Time: 10/19/14 12:05 PM  Result Value Ref Range   Glucose-Capillary 254 (H) 70 - 99 mg/dL  Glucose, capillary     Status: Abnormal   Collection Time: 10/19/14  4:54 PM  Result Value Ref Range   Glucose-Capillary 136 (H) 70 - 99 mg/dL  Glucose, capillary     Status: Abnormal   Collection Time: 10/19/14  9:45 PM  Result Value Ref Range   Glucose-Capillary 202 (H) 70 - 99 mg/dL   Comment 1 Notify RN    Comment 2 Documented in Char   Glucose, capillary     Status: Abnormal   Collection Time: 10/20/14  7:18 AM  Result Value Ref Range   Glucose-Capillary 158 (H) 70 - 99 mg/dL   Comment 1 Notify RN    Comment 2 Documented in Char    Ct Head Wo Contrast  10/20/2014   CLINICAL DATA:  Follow-up subdural hematoma status post craniotomy. Subsequent encounter.  EXAM: CT HEAD WITHOUT CONTRAST  TECHNIQUE: Contiguous axial images were obtained from the base of the skull through the vertex without intravenous contrast.  COMPARISON:  MR head 10/16/2014.  FINDINGS: BILATERAL frontal craniotomies were performed for subdural hematoma evacuation. BILATERAL Jackson-Pratt drains lie within the subdural space. Minor pneumocephalus is seen anteriorly.  Considerable residual subdural fluid is present. On the RIGHT, this is relatively hypoattenuating, 15-20 HU, consistent with residual chronic subdural hematoma and/or hygroma. On the LEFT, there is considerable residual hyperdense material notably over the parietal convexity measuring between 45 and 65 HU suggesting subacute or acute hemorrhage. Maximum thickness is 23 mm RIGHT and 19 mm LEFT.  The brain is squeezed centrally with effacement of the cortical sulci. There is no midline shift or hydrocephalus. No parenchymal hemorrhage is present. 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 Scalp soft tissues reflect previous BILATERAL craniotomy but are otherwise unremarkable. No sinus or mastoid disease.  IMPRESSION: Residual/recurrent BILATERAL subdural hematomata, slightly larger on the RIGHT but with more acute to subacute blood products on the LEFT.   Electronically Signed   By: Davonna Belling M.D.   On: 10/20/2014 07:07    Assessment/Plan: Diagnosis: bilateral SDH's s/p craniotomies on 2/18 1. Does the need for close, 24 hr/day medical supervision in concert with the patient's rehab needs make it unreasonable for this patient to be served in a less intensive setting? Yes 2. Co-Morbidities requiring supervision/potential complications: afib, type 2 dm 3. Due to bladder management, bowel management, safety, skin/wound care, disease management, medication administration, pain management and patient education, does the patient require 24 hr/day rehab nursing? Yes 4. Does the patient require coordinated care of a physician, rehab nurse, PT (1-2 hrs/day, 5 days/week), OT (1-2 hrs/day, 5 days/week) and SLP (1-2 hrs/day, 5 days/week) to address physical and functional deficits in the context of the above medical diagnosis(es)? Yes Addressing deficits in the following areas: balance, endurance, locomotion, strength, transferring, bowel/bladder control, bathing, dressing, feeding, grooming, toileting, cognition and psychosocial support 5. Can the patient actively participate in an intensive therapy program of at least 3 hrs of therapy per day at least 5 days per week? Yes 6. The potential for patient to make measurable gains while on inpatient rehab is excellent 7. Anticipated functional outcomes upon discharge from inpatient rehab are modified independent  with PT, modified independent  with OT, modified independent and supervision with SLP. 8. Estimated rehab length of stay to reach the above functional goals is: 7-9 days 9. Does the patient have  adequate social supports and living environment to accommodate these discharge functional goals? Potentially 10. Anticipated D/C setting: Home 11. Anticipated post D/C treatments: HH therapy and Outpatient therapy 12. Overall Rehab/Functional Prognosis: excellent  RECOMMENDATIONS: This patient's condition is appropriate for continued rehabilitative care in the following setting: CIR Patient has agreed to participate in recommended program. Potentially Note that insurance prior authorization may be required for reimbursement for recommended care.  Comment: Will need some supervision at least initially at home. Rehab Admissions Coordinator to follow up.  Thanks,  Ranelle Oyster, MD, [place]     10/20/2014

## 2014-10-20 NOTE — Progress Notes (Signed)
PT Cancellation Note  Patient Details Name: Mason [name] MRN: [id number] DOB: 11-02-1926   Cancelled Treatment:    Reason Eval/Treat Not Completed: Patient not medically ready.  Pt continues to be on bedrest.  Please advise if pt is appropriate for mobility, or if PT should sign off.  Thanks.     Willford Rabideau, Alison MurrayMegan F 10/20/2014, 7:54 AM

## 2014-10-20 NOTE — Progress Notes (Addendum)
Speech Language Pathology Treatment: Cognitive-Linquistic  Patient Details Name: Mason [name] MRN: [id number] DOB: 24-Jul-1927 Today's Date: 10/20/2014 Time: 1037-1100 SLP Time Calculation (min) (ACUTE ONLY): 23 min  Assessment / Plan / Recommendation Clinical Impression  Treatment focused on diagnostic treatment in the areas of auditory comprehension for paragraph level information, recall of conversation with MD, functional reading and problem solving using menu and ability to follow complex command. He continued to demonstrate decreased semantics during explanation of wife's current location and caretaker while he is hospitalized. He required minimal verbal/visual ues during activities described above. Continue intervention.   HPI HPI: 81 yr old admitted  with 3 week history of progressively worsening confusion, falling and balance problems. Per MD documentationson has noticed progressively worsening confusion and aphasia.Patient has been independent until very recently (lives with wife) and has fallen and hit his head on multiple occasions. No past medical history on file. Imaging revealed large bilateral subdural hematoma.   Pertinent Vitals Pain Assessment: No/denies pain  SLP Plan  Continue with current plan of care    Recommendations                Oral Care Recommendations: Oral care BID Follow up Recommendations: Inpatient Rehab Plan: Continue with current plan of care    GO     [name], Niema Carrara Willis 10/20/2014, 11:15 AM   [name] M.Ed [employer]-SLP Pager [phone]

## 2014-10-20 NOTE — Progress Notes (Signed)
PULMONARY / CRITICAL CARE MEDICINE   Name: Mason Stevens MRN: [id number] DOB: 07/10/1927    ADMISSION DATE:  10/16/2014 CONSULTATION DATE:  10/20/2014  REFERRING MD :  [facility] ED  CHIEF COMPLAINT:  SDH  INITIAL PRESENTATION:  81 y.o. M brought to [facility] ED 2/18 for dizziness and falling to his R side.  In ED, found to have bilateral SDH and was subsequently transferred to [facility] for surgical intervention. He underwent evacuation later that afternoon Venetia Maxon) and was extubated in PACU before returning to ICU.  PCCM consulted for medical management..    STUDIES:  MRI Head 2/18 >>> Bilateral SDH (Going off OR notes as imaging from [facility] not loading on [facility] computers, error message each time). ECHO 2/19: - Left ventricle: The cavity size was mildly dilated. Wallthickness was normal. The estimated ejection fraction was 55%.Wall motion was normal; there were no regional wall motionabnormalities.- Aorta: Mild dilitation of the aortic root. Aortic root dimension: 41 mm (ED).- Left atrium: The atrium was moderately to severely dilated.- Right ventricle: The cavity size was normal. Systolic function was normal.- Right atrium: The atrium was mildly dilated. 2/22 CT head > Residual bilateral subdural hematoma slightly larger on R but more acute blood products on left  SIGNIFICANT EVENTS: 2/18 - presented to [facility] with dizziness, found to have B SDH, transferred to [facility] and underwent B craniotomy and evacuation of hematoma in OR 2/19 - slow a fib  SUBJECTIVE: feels well, working with speech therapy  VITAL SIGNS: Temp:  [97.2 F (36.2 C)-100.5 F (38.1 C)] 99.6 F (37.6 C) (02/22 0722) Pulse Rate:  [35-109] 59 (02/22 1000) Resp:  [11-31] 31 (02/22 1000) BP: (111-148)/(33-84) 147/69 mmHg (02/22 1000) SpO2:  [91 %-100 %] 100 % (02/22 1000) Weight:  [74.4 kg (164 lb 0.4 oz)] 74.4 kg (164 lb 0.4 oz) (02/22 0722) INTAKE / OUTPUT: Intake/Output      02/21 0701 - 02/22 0700 02/22 0701 - 02/23 0700   P.O.  1160 400   I.V. (mL/kg) 1200 (15.2) 150 (2)   Other     Total Intake(mL/kg) 2360 (30) 550 (7.4)   Urine (mL/kg/hr) 3190 (1.7) 200 (0.8)   Drains 34 (0) 15 (0.1)   Stool 0 (0) 0 (0)   Total Output 3224 215   Net -864 +335        Stool Occurrence 4 x 1 x     PHYSICAL EXAMINATION: General: no distress HEENT: bilateral subdural drains in place PULM: CTA B CV: Irreg irreg, split S2 noted AB: BS+, soft Ext: warm no edema Neuro: A&OX4, maew  LABS:  CBC  Recent Labs Lab 10/16/14 1625 10/17/14 0525 10/18/14 0507  WBC 5.8 6.7 6.9  HGB 12.8* 11.6* 13.0  HCT 37.9* 34.5* 38.3*  PLT 247 230 206    BMET  Recent Labs Lab 10/16/14 1625 10/17/14 0525 10/18/14 0507  NA 140 139 136  K 3.1* 3.6 3.7  CL 109 111 110  CO2 BUN CREATININE 0.80 0.90 0.86  GLUCOSE 180* 156* 188*   Electrolytes  Recent Labs Lab 10/16/14 1625 10/17/14 0525 10/18/14 0507  CALCIUM 8.5 8.3* 8.0*  MG  --  1.8 1.8  PHOS  --  2.4  --    Liver Enzymes  Recent Labs Lab 10/18/14 0507  AST 23  ALT 8  ALKPHOS 65  BILITOT 0.9  ALBUMIN 2.8*     Cardiac Enzymes  Recent Labs Lab 10/17/14 1726 10/17/14 2306 10/18/14 0507  TROPONINI 0.03 <0.03 0.03     Glucose  Recent Labs Lab 10/18/14 2118 10/19/14 0840 10/19/14 1205 10/19/14 1654 10/19/14 2145 10/20/14 0718  GLUCAP 169* 151* 254* 136* 202* 158*    Imaging  ASSESSMENT / PLAN:  Bilateral traumatic SDH s/p bilateral craniotomies  Plan: Post-op care, AED's per neurosurgery  Atrial fibrillation> he tells me 2/22 AM that he has been aware of this for years and his PCP told him to take and aspirin but nothing else for it; currently he is rate controlled and cannot receive anticoagulation Hypertension Plan: Resume ASA when OK by neurosurgery Continue BP control: prn labetalol, scheduled lisinopril, HCTZ, amlodipine If rate control becomes an issue can change amlodipine to cardizem  DM type II P:    Continue SSI Resume metformin at hospital discharge  Looks great. Can go to tele when Drains out.   Heber CarolinaBrent Aeon Kessner, MD [place] P[facility]M Pager: [phone] Cell: [phone] If no response, call [phone]  10/20/2014 10:34 AM

## 2014-10-21 ENCOUNTER — Inpatient Hospital Stay (HOSPITAL_COMMUNITY): Payer: Medicare HMO

## 2014-10-21 LAB — GLUCOSE, CAPILLARY
GLUCOSE-CAPILLARY: 217 mg/dL — AB (ref 70–99)
Glucose-Capillary: 163 mg/dL — ABNORMAL HIGH (ref 70–99)
Glucose-Capillary: 237 mg/dL — ABNORMAL HIGH (ref 70–99)
Glucose-Capillary: 226 mg/dL — ABNORMAL HIGH (ref 70–99)

## 2014-10-21 MED ORDER — HYDROCHLOROTHIAZIDE 25 MG PO TABS
25.0000 mg | ORAL_TABLET | Freq: Every day | ORAL | Status: DC
  Administered 2014-10-22 – 2014-10-24 (×3): 25 mg via ORAL
  Filled 2014-10-21 (×3): qty 1

## 2014-10-21 MED ORDER — LISINOPRIL 20 MG PO TABS
40.0000 mg | ORAL_TABLET | Freq: Every day | ORAL | Status: DC
  Administered 2014-10-22 – 2014-10-24 (×3): 40 mg via ORAL
  Filled 2014-10-21: qty 1
  Filled 2014-10-21 (×2): qty 2

## 2014-10-21 MED ORDER — METFORMIN HCL 500 MG PO TABS
1000.0000 mg | ORAL_TABLET | Freq: Two times a day (BID) | ORAL | Status: DC
  Administered 2014-10-21 – 2014-10-24 (×7): 1000 mg via ORAL
  Filled 2014-10-21 (×9): qty 2

## 2014-10-21 NOTE — Evaluation (Signed)
Occupational Therapy Evaluation Patient Details Name: Mason Stevens MRN: [id number] DOB: 24-Apr-1927 Today's Date: 10/21/2014    History of Present Illness pt presents with Bil SDH s/p Bil Crani.  pt with recent hx of multiple falls.     Clinical Impression   Pt admitted with the above diagnosis and has the deficits listed below. Pt was very independent prior to this medical problem, caregives for his wife, and is very active.  Feel pt would benefit from cont OT to increase independence with basic adls so he can care for himself and his wife again in their home.  Pt would be excellent rehab candidate with goals of being mod I with self care.    Follow Up Recommendations  CIR    Equipment Recommendations  Other (comment) (TBD)    Recommendations for Other Services       Precautions / Restrictions Precautions Precautions: Fall Restrictions Weight Bearing Restrictions: No      Mobility Bed Mobility Overal bed mobility: Needs Assistance Bed Mobility: Supine to Sit     Supine to sit: Min assist     General bed mobility comments: pt moves slowly and only needs A for bringing trunk up to sitting.    Transfers Overall transfer level: Needs assistance Equipment used: 1 person hand held assist Transfers: Sit to/from [employer] Sit to Stand: Min assist Stand pivot transfers: Min assist       General transfer comment: pt mildly unsteady and needs A for balance.  cues for safe technique.      Balance Overall balance assessment: Needs assistance Sitting-balance support: Feet supported Sitting balance-Leahy Scale: Fair     Standing balance support: Bilateral upper extremity supported;During functional activity Standing balance-Leahy Scale: Poor Standing balance comment: must have outside support at this time to stand but was able to leg go of therapist in standing for seconds at a time but could not take challenges.                            ADL Overall ADL's : Needs assistance/impaired Eating/Feeding: Set up;Sitting   Grooming: Wash/dry face;Wash/dry hands;Oral care;Set up;Sitting   Upper Body Bathing: Set up;Sitting   Lower Body Bathing: Minimal assistance;Sit to/from stand Lower Body Bathing Details (indicate cue type and reason): min assist for balance when coming sit to stand and when doing all adls in standing. Upper Body Dressing : Set up;Sitting   Lower Body Dressing: Moderate assistance;Sit to/from stand Lower Body Dressing Details (indicate cue type and reason): Pt needs min assist to come to stand and mod assist to be able to let go of the walker to fasten pants etc.   Toilet Transfer: Minimal assistance;Ambulation;BSC Toilet Transfer Details (indicate cue type and reason): min assist for balance. Toileting- Clothing Manipulation and Hygiene: Minimal assistance;Sit to/from stand;Cueing for safety Toileting - Clothing Manipulation Details (indicate cue type and reason): min assist to maintain balance while standing to clean self.     Functional mobility during ADLs: Minimal assistance;Cueing for safety;+2 for safety/equipment General ADL Comments: Pt overall doing better with adls.  Pt with some decreased balance when on his feet during adls and safety issues when up.  Pt with mild impulsivity when standing stepping on cords etc when walking.     Vision Vision Assessment?: No apparent visual deficits   Perception     Praxis      Pertinent Vitals/Pain Pain Assessment: No/denies pain     Hand Dominance  Right   Extremity/Trunk Assessment Upper Extremity Assessment Upper Extremity Assessment: Overall WFL for tasks assessed   Lower Extremity Assessment Lower Extremity Assessment: Defer to PT evaluation   Cervical / Trunk Assessment Cervical / Trunk Assessment: Normal   Communication Communication Communication: No difficulties   Cognition Arousal/Alertness: Awake/alert Behavior During  Therapy: WFL for tasks assessed/performed Overall Cognitive Status: Impaired/Different from baseline Area of Impairment: Safety/judgement;Awareness;Problem solving         Safety/Judgement: Decreased awareness of deficits Awareness: Emergent Problem Solving: Slow processing;Requires verbal cues General Comments: Pt's cognition appears to be improving.  Pt with some mild word finding deficts and struggles more when asked to do 2-3 step commands.     General Comments       Exercises       Shoulder Instructions      Home Living Family/patient expects to be discharged to:: Inpatient rehab Living Arrangements: Alone;Spouse/significant other;Other (Comment) (cares for his wife but now family doing this)   Type of Home: House                              Lives With: Spouse    Prior Functioning/Environment Level of Independence: Independent        Comments: pt indicates recently had been given a cane and Rollator after his falls.      OT Diagnosis: Generalized weakness;Cognitive deficits   OT Problem List: Impaired balance (sitting and/or standing);Decreased cognition;Decreased safety awareness;Decreased knowledge of use of DME or AE;Decreased knowledge of precautions   OT Treatment/Interventions: Self-care/ADL training;DME and/or AE instruction;Therapeutic activities;Cognitive remediation/compensation    OT Goals(Current goals can be found in the care plan section) Acute Rehab OT Goals Patient Stated Goal: Get home with his wife. OT Goal Formulation: With patient Time For Goal Achievement: 11/04/14 Potential to Achieve Goals: Good ADL Goals Pt Will Perform Grooming: with supervision;standing Pt Will Perform Lower Body Bathing: with supervision;sit to/from stand Pt Will Perform Lower Body Dressing: with supervision;sit to/from stand Pt Will Perform Tub/Shower Transfer: Shower transfer;ambulating;rolling walker;with supervision Additional ADL Goal #1: Pt will  complete all aspects of toileting on regular commode with grab rail. Additional ADL Goal #2: Pt will complete 3 money transactions with S.  OT Frequency: Min 3X/week   Barriers to D/C:    supportive son       Co-evaluation              End of Session Nurse Communication: Mobility status  Activity Tolerance: Patient tolerated treatment well Patient left: in chair;with call bell/phone within reach;with family/visitor present   Time: 4010-27251108-1142 OT Time Calculation (min): 34 min Charges:  OT General Charges $OT Visit: 1 Procedure OT Evaluation $Initial OT Evaluation Tier I: 1 Procedure OT Treatments $Self Care/Home Management : 8-22 mins G-Codes:    Hope BuddsJones, Erynne Kealey Anne 10/21/2014, 11:59 AM  [phone]

## 2014-10-21 NOTE — Plan of Care (Signed)
Problem: Phase I Progression Outcomes Goal: Initial discharge plan identified Outcome: Completed/Met Date Met:  10/21/14 Anticipate potential admission to rehab

## 2014-10-21 NOTE — Progress Notes (Signed)
Inpatient Diabetes Program Recommendations  AACE/ADA: New Consensus Statement on Inpatient Glycemic Control (2013)  Target Ranges:  Prepandial:   less than 140 mg/dL      Peak postprandial:   less than 180 mg/dL (1-2 hours)      Critically ill patients:  140 - 180 mg/dL   Inpatient Diabetes Program Recommendations Insulin - Basal: consider adding NPH 4 units BID Thank you  Piedad ClimesGina Abdias Hickam BSN, RN,CDE Inpatient Diabetes Coordinator [phone] (team pager)

## 2014-10-21 NOTE — Progress Notes (Signed)
Subjective: Patient reports doing well  Objective: Vital signs in last 24 hours: Temp:  [97.8 F (36.6 C)-99.6 F (37.6 C)] 99.6 F (37.6 C) (02/23 0340) Pulse Rate:  [36-122] 62 (02/23 0700) Resp:  [10-31] 22 (02/23 0700) BP: (118-179)/(46-81) 179/70 mmHg (02/23 0717) SpO2:  [94 %-100 %] 99 % (02/23 0700)  Intake/Output from previous day: 02/22 0701 - 02/23 0700 In: 1920 [P.O.:720; I.V.:1200] Out: 3076 [Urine:3010; Drains:66] Intake/Output this shift:    Physical Exam: Up to chair.  Drain output has decreased.  Speech fluent.  Lab Results: No results for input(s): WBC, HGB, HCT, PLT in the last 72 hours. BMET No results for input(s): NA, K, CL, CO2, GLUCOSE, BUN, CREATININE, CALCIUM in the last 72 hours.  Studies/Results: Ct Head Wo Contrast  10/20/2014   CLINICAL DATA:  Follow-up subdural hematoma status post craniotomy. Subsequent encounter.  EXAM: CT HEAD WITHOUT CONTRAST  TECHNIQUE: Contiguous axial images were obtained from the base of the skull through the vertex without intravenous contrast.  COMPARISON:  MR head 10/16/2014.  FINDINGS: BILATERAL frontal craniotomies were performed for subdural hematoma evacuation. BILATERAL Jackson-Pratt drains lie within the subdural space. Minor pneumocephalus is seen anteriorly.  Considerable residual subdural fluid is present. On the RIGHT, this is relatively hypoattenuating, 15-20 HU, consistent with residual chronic subdural hematoma and/or hygroma. On the LEFT, there is considerable residual hyperdense material notably over the parietal convexity measuring between 45 and 65 HU suggesting subacute or acute hemorrhage. Maximum thickness is 23 mm RIGHT and 19 mm LEFT.  The brain is squeezed centrally with effacement of the cortical sulci. There is no midline shift or hydrocephalus. No parenchymal hemorrhage is present. 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 Scalp soft tissues reflect previous BILATERAL craniotomy but are otherwise  unremarkable. No sinus or mastoid disease.  IMPRESSION: Residual/recurrent BILATERAL subdural hematomata, slightly larger on the RIGHT but with more acute to subacute blood products on the LEFT.   Electronically Signed   By: Davonna BellingJohn  Curnes M.D.   On: 10/20/2014 07:07    Assessment/Plan: Head CT stable to improved.  D/C drains.  Mobilize, consider Rehab.    LOS: 5 days    Dorian HeckleSTERN,Rashaunda Rahl D, MD 10/21/2014, 7:31 AM

## 2014-10-21 NOTE — Progress Notes (Signed)
Speech Language Pathology Treatment: Cognitive-Linquistic  Patient Details Name: Mason Stevens MRN: [id number] DOB: [birth year] Today's Date: 10/21/2014 Time: [id number] SLP Time Calculation (min) (ACUTE ONLY): 11 min  Assessment / Plan / Recommendation Clinical Impression  Pt seen for cognitive-linguistic treatment. Pt's verbal response time improved today in conversation. He required supervision cues during verbal description and explanation of familiar topics. Pt given mild verbal/visual assist to navigate and problem solve during questioning using menu. Hopeful transfer to inpt rehab for continued ST moving toward independence.   HPI HPI: 81 yr old admitted  with 3 week history of progressively worsening confusion, falling and balance problems. Per MD documentationson has noticed progressively worsening confusion and aphasia.Patient has been independent until very recently (lives with wife) and has fallen and hit his head on multiple occasions. No past medical history on file. Imaging revealed large bilateral subdural hematoma.   Pertinent Vitals Pain Assessment: No/denies pain  SLP Plan  Continue with current plan of care    Recommendations                Oral Care Recommendations: Oral care BID Follow up Recommendations: Inpatient Rehab Plan: Continue with current plan of care    GO     Mason Stevens, Mason Stevens 10/21/2014, 2:24 PM  Mason Stevens M.Ed [employer] Pager [phone]

## 2014-10-21 NOTE — Progress Notes (Signed)
PULMONARY / CRITICAL CARE MEDICINE   Name: Mason Stevens MRN: [id number] DOB: 01-29-1927    ADMISSION DATE:  10/16/2014 CONSULTATION DATE:  10/21/2014  [facility]  CHIEF COMPLAINT:  SDH  INITIAL PRESENTATION:  81 y.o. M [facility] for dizziness and falling to his R side.  In ED, found to have bilateral SDH and [facility]. He underwent evacuation later that afternoon Venetia Maxon(Stern) and was extubated in PACU before returning to ICU.  PCCM consulted for medical management..    STUDIES:  MRI Head 2/18 >>> Bilateral SDH (Going off OR notes as [facility] error message each time). ECHO 2/19: - Left ventricle: The cavity size was mildly dilated. Wallthickness was normal. The estimated ejection fraction was 55%.Wall motion was normal; there were no regional wall motionabnormalities.- Aorta: Mild dilitation of the aortic root. Aortic root dimension: 41 mm (ED).- Left atrium: The atrium was moderately to severely dilated.- Right ventricle: The cavity size was normal. Systolic function was normal.- Right atrium: The atrium was mildly dilated. 2/22 CT head > Residual bilateral subdural hematoma slightly larger on R but more acute blood products on left 2/23 CT head > SDH's are stable  SIGNIFICANT EVENTS: 2/18 - [facility], found to have B SDH, [facility] and underwent B craniotomy and evacuation of hematoma in OR 2/19 - slow a fib  SUBJECTIVE: feeling well, drains removed this morning  VITAL SIGNS: Temp:  [97.7 F (36.5 C)-99.6 F (37.6 C)] 97.7 F (36.5 C) (02/23 0717) Pulse Rate:  [36-122] 67 (02/23 1000) Resp:  [8-29] 23 (02/23 1000) BP: (117-179)/(46-82) 117/56 mmHg (02/23 1000) SpO2:  [94 %-100 %] 98 % (02/23 1000) INTAKE / OUTPUT: Intake/Output      02/22 0701 - 02/23 0700 02/23 0701 - 02/24 0700   P.O. 720 300   I.V. (mL/kg) 1200 (16.1) 150  (2)   Total Intake(mL/kg) 1920 (25.8) 450 (6)   Urine (mL/kg/hr) 3010 (1.7) 375 (1.4)   Drains 66 (0)    Stool 0 (0)    Total Output 3076 375   Net -1156 +75        Stool Occurrence 4 x      PHYSICAL EXAMINATION: General: no distress HEENT: bilateral subdural drains now removed, staples in place PULM: CTA B CV: Irreg irreg, split S2 noted AB: BS+, soft Ext: warm no edema Neuro: A&OX4, maew  LABS:  CBC  Recent Labs Lab 10/16/14 1625 10/17/14 0525 10/18/14 0507  WBC 5.8 6.7 6.9  HGB 12.8* 11.6* 13.0  HCT 37.9* 34.5* 38.3*  PLT 247 230 206    BMET  Recent Labs Lab 10/16/14 1625 10/17/14 0525 10/18/14 0507  NA 140 139 136  K 3.1* 3.6 3.7  CL 109 111 110  CO2 21 22 22   BUN 16 16 15   CREATININE 0.80 0.90 0.86  GLUCOSE 180* 156* 188*   Electrolytes  Recent Labs Lab 10/16/14 1625 10/17/14 0525 10/18/14 0507  CALCIUM 8.5 8.3* 8.0*  MG  --  1.8 1.8  PHOS  --  2.4  --    Liver Enzymes  Recent Labs Lab 10/18/14 0507  AST 23  ALT 8  ALKPHOS 65  BILITOT 0.9  ALBUMIN 2.8*     Cardiac Enzymes  Recent Labs Lab 10/17/14 1726 10/17/14 2306 10/18/14 0507  TROPONINI 0.03 <0.03 0.03     Glucose  Recent Labs Lab 10/19/14  2145 10/20/14 0718 10/20/14 1127 10/20/14 1707 10/20/14 2218 10/21/14 0811  GLUCAP 202* 158* 206* 148* 363* 163*    Imaging  ASSESSMENT / PLAN:  Bilateral traumatic SDH s/p bilateral craniotomies  Plan: Post-op care, AED's per neurosurgery  Atrial fibrillation> self rate controlled, had long conversation with patient and son today that he should likely resume aspirin when OK by NSGY but would not consider full anticoagulation Hypertension Plan: Resume ASA when OK by neurosurgery Continue BP control: increase lisinopril and HCTZ to home doses, continue amlodipine If rate control becomes an issue can change amlodipine to cardizem  DM type II P:   Continue SSI Resume metformin  Looks great. OK to transfer to  Tele  PCCM to sign off, call if questions  Heber Columbine, MD [facility] Pager: [phone] Cell: [phone] If no response, call [phone]  10/21/2014 10:35 AM

## 2014-10-21 NOTE — PMR Pre-admission (Signed)
PMR Admission Coordinator Pre-Admission Assessment  Patient: Mason Stevens is an 81 y.o., male MRN: [id number] DOB: May 21, 1927 Height: 5' 10"  (177.8 cm) Weight: 74.4 kg (164 lb 0.4 oz) (family reports this is about his baseline weight)              Insurance Information HMO:      PPO: Yes     PCP:       IPA:       80/20:       OTHER:   PRIMARY: A[employer]na Medicare      Policy#: Elvin So      Subscriber: [place] Name: Su Hoff      Phone#: [phone]     Fax#: [phone] Pre-Cert#: [id number] X 7 days with update due 10/29/14      Employer: Retired Benefits:  Phone #: [phone]     Name: Automated Eff. Date:  08/29/14     Deduct:  $0      Out of Pocket Max: [phone] (met $142.45)      Life Max: unlimited CIR: $285 days 1-6      SNF:  $0 days 1-20; $160 days 21-100 Outpatient: No visit limits     Co-Pay: $40/visit Home Health: 100%      Co-Pay: none DME: 80%     Co-Pay: 20% Providers:  In network  Emergency [place]    Name Roberta M Spouse [place] Son   [phone]   Julie, Paolini   [phone]   Earland, Reish    [phone]     Current Medical History  Patient Admitting Diagnosis:  B SDH with B craniotomies  History of Present Illness: An 81 y.o. right handed male with history of hypertension, diabetes mellitus and peripheral neuropathy. Admitted 10/16/2014 with frequent falls as well as altered mental status. Initially presented to [facility] Cranial CT scan showed large bilateral subdural hematomas. Patient was transferred to [facility] and underwent bilateral craniotomy hematoma evacuation of subdural hematoma 10/16/2014 per Dr. Vertell Limber. Placed on Keppra for seizure prophylaxis. Follow-up cranial CT scan 10/21/2014 with no acute changes. Patient with new onset atrial fibrillation. Echocardiogram with ejection  fraction of 55% no wall motion abnormalities. Troponin negative. Plan to begin aspirin therapy once cleared by neurosurgery. Tolerating a regular dysphagia 2 thin liquids. Physical and occupational therapy evaluations completed and M.D. requested physical medicine rehabilitation consult. Patient to be admitted for comprehensive inpatient rehabilitation program.     Total: 4=NIH  Past Medical History  History reviewed. No pertinent past medical history.  Family History  family history is not on file.  Prior Rehab/Hospitalizations:  None   Current Medications   Current facility-administered medications:  .  0.9 %  sodium chloride infusion, , Intravenous, Continuous, Raylene Miyamoto, MD, Last Rate: 50 mL/hr at 10/23/14 2204, 1,000 mL at 10/23/14 2204 .  acetaminophen (TYLENOL) tablet 650 mg, 650 mg, Oral, Q4H PRN, 650 mg at 10/22/14 1129 **OR** acetaminophen (TYLENOL) suppository 650 mg, 650 mg, Rectal, Q4H PRN, Erline Levine, MD .  amLODipine (NORVASC) tablet 10 mg, 10 mg, Oral, Daily, Erline Levine, MD, 10 mg at 10/23/14 1010 .  bisacodyl (DULCOLAX) suppository 10 mg, 10 mg, Rectal, Daily PRN, Erline Levine, MD, 10 mg at 10/18/14 1604 .  diclofenac sodium (VOLTAREN) 1 % transdermal gel 2 g, 2 g, Topical, QID PRN, Erline Levine, MD, 2 g at 10/22/14 2005 .  docusate sodium (COLACE)  capsule 100 mg, 100 mg, Oral, BID, Erline Levine, MD, 100 mg at 10/23/14 2201 .  feeding supplement (GLUCERNA SHAKE) (GLUCERNA SHAKE) liquid 237 mL, 237 mL, Oral, Q24H, Baird Lyons, RD, 237 mL at 10/23/14 1952 .  hydrALAZINE (APRESOLINE) injection 10-40 mg, 10-40 mg, Intravenous, Q4H PRN, Rahul P Desai, PA-C, 20 mg at 10/21/14 2549 .  hydrALAZINE (APRESOLINE) tablet 20 mg, 20 mg, Oral, BID, Erline Levine, MD, 20 mg at 10/23/14 2201 .  lisinopril (PRINIVIL,ZESTRIL) tablet 40 mg, 40 mg, Oral, Daily, 40 mg at 10/23/14 1009 **AND** hydrochlorothiazide (HYDRODIURIL) tablet 25 mg, 25 mg, Oral, Daily, Juanito Doom,  MD, 25 mg at 10/23/14 1009 .  HYDROcodone-acetaminophen (NORCO/VICODIN) 5-325 MG per tablet 1 tablet, 1 tablet, Oral, Q4H PRN, Erline Levine, MD, 1 tablet at 10/24/14 [phone] .  insulin aspart (novoLOG) injection 0-15 Units, 0-15 Units, Subcutaneous, TID WC, Chesley Mires, MD, 3 Units at 10/24/14 [phone] .  insulin aspart (novoLOG) injection 0-5 Units, 0-5 Units, Subcutaneous, QHS, Chesley Mires, MD, 2 Units at 10/22/14 2209 .  labetalol (NORMODYNE,TRANDATE) injection 10-40 mg, 10-40 mg, Intravenous, Q10 min PRN, Erline Levine, MD .  levETIRAcetam (KEPPRA) tablet 500 mg, 500 mg, Oral, BID, Erline Levine, MD, 500 mg at 10/23/14 2201 .  metFORMIN (GLUCOPHAGE) tablet 1,000 mg, 1,000 mg, Oral, BID WC, Juanito Doom, MD, 1,000 mg at 10/24/14 0756 .  morphine 2 MG/ML injection 1-2 mg, 1-2 mg, Intravenous, Q2H PRN, Erline Levine, MD, 1 mg at 10/17/14 0323 .  multivitamin with minerals tablet 1 tablet, 1 tablet, Oral, Daily, Baird Lyons, RD, 1 tablet at 10/23/14 1747 .  ondansetron (ZOFRAN) tablet 4 mg, 4 mg, Oral, Q4H PRN **OR** ondansetron (ZOFRAN) injection 4 mg, 4 mg, Intravenous, Q4H PRN, Erline Levine, MD .  pantoprazole (PROTONIX) EC tablet 40 mg, 40 mg, Oral, Q1200, Chesley Mires, MD, 40 mg at 10/23/14 1232 .  polyethylene glycol (MIRALAX / GLYCOLAX) packet 17 g, 17 g, Oral, Daily PRN, Erline Levine, MD, 17 g at 10/23/14 0819 .  pravastatin (PRAVACHOL) tablet 10 mg, 10 mg, Oral, q1800, Erline Levine, MD, 10 mg at 10/23/14 1745 .  promethazine (PHENERGAN) tablet 12.5-25 mg, 12.5-25 mg, Oral, Q4H PRN, Erline Levine, MD .  senna [facility] tablet 8.6 mg, 1 tablet, Oral, BID, Erline Levine, MD, 8.6 mg at 10/23/14 2201  Patients Current Diet: DIET DYS 2  Precautions / Restrictions Precautions Precautions: Fall Precaution Comments: pt with Bil JP drains from Bil Crani incisions.   Restrictions Weight Bearing Restrictions: No Other Position/Activity Restrictions: Current order for L side-lying or up to chair  only.     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   Home Assistive Devices / Equipment Home Assistive Devices/Equipment: Cane (specify quad or straight), Walker (specify type), Grab bars in shower  Prior Functional Level Prior Function Level of Independence: Independent Comments: pt indicates recently had been given a cane and Rollator after his falls.    Current Functional Level Cognition  Arousal/Alertness: Awake/alert Overall Cognitive Status: Impaired/Different from baseline Orientation Level: Oriented X4 Safety/Judgement: Decreased awareness of deficits General Comments: Improving but still unaware of balance deficits and R side lean with ambulation Attention: Sustained Sustained Attention: Appears intact Memory: Appears intact (require only 1 semantic cue for 4 word recall) Awareness: Appears intact Problem Solving:  (basic functiona, will assess higher level)  Safety/Judgment: Appears intact [employer] Scales of Cognitive Functioning: Automatic/appropriate    Extremity Assessment (includes Sensation/Coordination)  Upper Extremity Assessment: Overall WFL for tasks assessed  Lower Extremity Assessment: Defer to PT evaluation    ADLs  Overall ADL's : Needs assistance/impaired Eating/Feeding: Set up, Sitting Grooming: Wash/dry face, Wash/dry hands, Oral care, Set up, Sitting Upper Body Bathing: Set up, Sitting Lower Body Bathing: Minimal assistance, Sit to/from stand Lower Body Bathing Details (indicate cue type and reason): min assist for balance when coming sit to stand and when doing all adls in standing. Upper Body Dressing : Set up, Sitting Lower Body Dressing: Moderate assistance, Sit to/from stand Lower Body Dressing Details (indicate cue type and reason): Pt needs min assist to come to stand and mod assist to be  able to let go of the walker to fasten pants etc.   Toilet Transfer: Minimal assistance, Ambulation, BSC Toilet Transfer Details (indicate cue type and reason): min assist for balance. Toileting- Clothing Manipulation and Hygiene: Minimal assistance, Sit to/from stand, Cueing for safety Toileting - Clothing Manipulation Details (indicate cue type and reason): min assist to maintain balance while standing to clean self. Functional mobility during ADLs: Minimal assistance, Cueing for safety, +2 for safety/equipment General ADL Comments: Pt overall doing better with adls.  Pt with some decreased balance when on his feet during adls and safety issues when up.  Pt with mild impulsivity when standing stepping on cords etc when walking.    Mobility  Overal bed mobility: Needs Assistance Bed Mobility: Supine to Sit Supine to sit: Min guard General bed mobility comments: MinGuard for safety    Transfers  Overall transfer level: Needs assistance Equipment used: 1 person hand held assist Transfers: Sit to/from Stand Sit to Stand: Min assist Stand pivot transfers: Min assist General transfer comment: pt mildly unsteady and needs A for balance.  cues for safe technique.      Ambulation / Gait / Stairs / Wheelchair Mobility  Ambulation/Gait Ambulation/Gait assistance: Museum/gallery curator (Feet): 100 Feet Assistive device: None Gait Pattern/deviations: Step-through pattern, Shuffle, Decreased stride length, Staggering right, Drifts right/left Gait velocity interpretation: Below normal speed for age/gender General Gait Details: Patient continues to drift right side with ambulation. Continues with decreased stride length especially with L pain due to increased pain in heel of foot. Continues to need assistance with turns    Posture / Balance Static Standing Balance Single Leg Stance - Right Leg: 5 Single Leg Stance - Left Leg: 1 Balance Overall balance assessment: Needs  assistance Sitting-balance support: Feet supported Sitting balance-Leahy Scale: Good Standing balance support: Bilateral upper extremity supported, During functional activity Standing balance-Leahy Scale: Poor Standing balance comment: Patient continues to require A with dynamic standing balance and with weight shifts. UE support required for static balance Single Leg Stance - Right Leg: 5 Single Leg Stance - Left Leg: 1 High level balance activites: Direction changes, Turns, Sudden stops, Head turns High Level Balance Comments: Min to Mod A to prevent fall especially to R side. Shuffle with turns.  Standardized Balance Assessment Standardized Balance Assessment : Berg Balance Test Berg Balance Test Sit to Stand: Able to stand without using hands and stabilize independently Standing Unsupported: Able to stand safely 2 minutes Sitting with Back Unsupported but Feet Supported on Floor or Stool: Able to sit safely and securely 2 minutes Stand to Sit: Sits safely with minimal use of hands Transfers: Able to transfer safely, definite need of hands Standing Unsupported with Eyes Closed:  Able to stand 3 seconds Standing Ubsupported with Feet Together: Able to place feet together independently but unable to hold for 30 seconds From Standing, Reach Forward with Outstretched Arm: Can reach forward >5 cm safely (2") From Standing Position, Pick up Object from Floor: Unable to pick up shoe, but reaches 2-5 cm (1-2") from shoe and balances independently From Standing Position, Turn to Look Behind Over each Shoulder: Turn sideways only but maintains balance Turn 360 Degrees: Needs assistance while turning Standing Unsupported, Alternately Place Feet on Step/Stool: Needs assistance to keep from falling or unable to try Standing Unsupported, One Foot in Front: Loses balance while stepping or standing Standing on One Leg: Unable to try or needs assist to prevent fall Total Score: 29    Special needs/care  consideration BiPAP/CPAP No CPM No Continuous Drip IV 0.9% NS 50 ml/hr Dialysis No        Life Vest No Oxygen No Special Bed No Trach Size No Wound Vac (area) No      Skin:  B crani stapled incisions                                Bowel mgmt: Has BM 10/22/14 Bladder mgmt: Voiding in urinal Diabetic mgmt Yes, on insulin at home     Previous Home Environment Living Arrangements: Alone, Spouse/significant other, Other (Comment) (cares for his wife but now family doing this)  Lives With: Spouse Type of Home: [place]: No  Discharge Living Setting Plans for Discharge Living Setting: Patient's home, House, Lives with (comment) Type of Home at Discharge: House Discharge Home Layout: One level Discharge Home Access: Stairs to enter Entrance Stairs-Number of Steps: 3 steps side entrance Does the patient have any problems obtaining your medications?: No  Social/Family/Support Systems Patient Roles: Spouse, Parent (Has a wife, 2 sons and 1 daughter.) Contact Information: Ryler Laskowski - son [phone] Anticipated Caregiver: sons and daughter Anticipated Caregiver's Contact Information: see emergency contacts Ability/Limitations of Caregiver: Sons and dtr plan to share 24 hr care for parents after rehab. Caregiver Availability: Evenings only Discharge Plan Discussed with Primary Caregiver: Yes Is Caregiver In Agreement with Plan?: Yes Does Caregiver/Family have Issues with Lodging/Transportation while Pt is in Rehab?: No  Goals/Additional Needs Patient/Family Goal for Rehab: PT/OT mod I, ST mod I/S goals Expected length of stay: 7-9 days Cultural Considerations: [facility] Dietary Needs: Dys 2, thin liquids Equipment Needs: TBD Pt/Family Agrees to Admission and willing to participate: Yes Program Orientation Provided & Reviewed with Pt/Caregiver Including Roles  & Responsibilities: Yes  Decrease burden of Care through IP rehab admission: N/A  Possible need  for SNF placement upon discharge: Not anticipated  Patient Condition: This patient's medical and functional status has changed since the consult dated: 10/20/14 in which the Rehabilitation Physician determined and documented that the patient's condition is appropriate for intensive rehabilitative care in an inpatient rehabilitation facility. See "History of Present Illness" (above) for medical update. Functional changes are: Currently requiring min assist for ambulation 100 ft no device. Patient's medical and functional status update has been discussed with the Rehabilitation physician and patient remains appropriate for inpatient rehabilitation. Will admit to inpatient rehab today.  Preadmission Screen Completed By:  Retta Diones, 10/24/2014 9:03 AM ______________________________________________________________________   Discussed status with Dr. Letta Pate on 10/24/14 at 0902 and received telephone approval for admission today.  Admission Coordinator:  Retta Diones, time0902/Date02/26/16

## 2014-10-21 NOTE — Progress Notes (Signed)
Rehab admissions - Evaluated for possible admission.  I met with patient and his son.  I gave them rehab brochures and explained inpatient rehab.  I have opened the case with [employer] medicare and have faxed information to insurance carrier requesting acute inpatient rehab admission.  I will follow up after I hear back from insurance carrier.  Call me for questions.  #[phone]

## 2014-10-22 LAB — GLUCOSE, CAPILLARY
Glucose-Capillary: 198 mg/dL — ABNORMAL HIGH (ref 70–99)
Glucose-Capillary: 194 mg/dL — ABNORMAL HIGH (ref 70–99)
Glucose-Capillary: 163 mg/dL — ABNORMAL HIGH (ref 70–99)
Glucose-Capillary: 237 mg/dL — ABNORMAL HIGH (ref 70–99)

## 2014-10-22 MED ORDER — DICLOFENAC SODIUM 1 % TD GEL
2.0000 g | Freq: Four times a day (QID) | TRANSDERMAL | Status: DC | PRN
  Administered 2014-10-22 (×2): 2 g via TOPICAL
  Filled 2014-10-22: qty 100

## 2014-10-22 NOTE — Progress Notes (Signed)
Physical Therapy Treatment Patient Details Name: Mason Stevens MRN: [id number] DOB: 07-15-1927 Today's Date: 10/22/2014    History of Present Illness pt presents with Bil SDH s/p Bil Crani.  pt with recent hx of multiple falls.      PT Comments    Patient is highly motivated and able to progress with ambulation. Patient is overall Min A with mobility due to balance deficits. Patient also unaware of these deficits. Patient could benefit from CIR stay and I believe return to Mod I level prior to returning home and decreasing the burden of care on family allowing them to assist the patients wife at home. Family will be able to provide supervision and support as they will be home providing care for patients wife. Prior to admission the patient himself was primary caregiver to his wife. Continue to recommend comprehensive inpatient rehab (CIR) for post-acute therapy needs.   Follow Up Recommendations  CIR     Equipment Recommendations  Rolling walker with 5" wheels    Recommendations for Other Services Rehab consult     Precautions / Restrictions Precautions Precautions: Fall    Mobility  Bed Mobility         Supine to sit: Min assist     General bed mobility comments: Min A for safety and to ensure balance once EOB.   Transfers Overall transfer level: Needs assistance Equipment used: 1 person hand held assist Transfers: Sit to/from Stand Sit to Stand: Min assist         General transfer comment: pt mildly unsteady and needs A for balance.  cues for safe technique.    Ambulation/Gait Ambulation/Gait assistance: Min assist;Mod assist Ambulation Distance (Feet): 90 Feet Assistive device: None (patient pushing IV pole) Gait Pattern/deviations: Step-through pattern;Decreased stride length;Shuffle;Trunk flexed;Staggering right;Narrow base of support   Gait velocity interpretation: Below normal speed for age/gender General Gait Details: Patient initiated with  shuffle gait and flexed posture. Patient stagerring R side with up to Mod A to correct and to regain balance back to midline. Min A for balance with turns. Patient cued to increase step length and BOS.    Stairs            Wheelchair Mobility    Modified Rankin (Stroke Patients Only)       Balance Overall balance assessment: Needs assistance   Sitting balance-Leahy Scale: Good       Standing balance-Leahy Scale: Poor Standing balance comment: Patient continues to require A with dynamic standing balance and with weight shifts. UE support required for static balance Single Leg Stance - Right Leg: 5 Single Leg Stance - Left Leg: 1         High level balance activites: Direction changes;Turns;Sudden stops;Head turns High Level Balance Comments: Min to Mod A to prevent fall especially to R side. Shuffle with turns.     Cognition Arousal/Alertness: Awake/alert Behavior During Therapy: WFL for tasks assessed/performed Overall Cognitive Status: Impaired/Different from baseline Area of Impairment: Awareness;Safety/judgement         Safety/Judgement: Decreased awareness of deficits   Problem Solving: Slow processing;Requires verbal cues General Comments: Improving but still unaware of balance deficits and R side lean with ambulation    Exercises      General Comments        Pertinent Vitals/Pain Pain Assessment: No/denies pain    Home Living                      Prior Function  PT Goals (current goals can now be found in the care plan section) Progress towards PT goals: Progressing toward goals    Frequency  Min 3X/week    PT Plan Current plan remains appropriate    Co-evaluation             End of Session Equipment Utilized During Treatment: Gait belt Activity Tolerance: Patient tolerated treatment well Patient left: in chair;with call bell/phone within reach;with family/visitor present;with chair alarm set     Time:  1422-1450 PT Time Calculation (min) (ACUTE ONLY): 28 min  Charges:  $Gait Training: 8-22 mins $Therapeutic Activity: 8-22 mins                    G Codes:      Fredrich BirksRobinette, Ryley Bachtel Elizabeth 10/22/2014, 2:59 PM 10/22/2014 Fredrich Birksobinette, Dacota Devall Elizabeth PTA [phone] pager [phone] office

## 2014-10-22 NOTE — Progress Notes (Signed)
Subjective: Patient reports "I'm doing pretty good"  Objective: Vital signs in last 24 hours: Temp:  [98.2 F (36.8 C)-99.4 F (37.4 C)] 99.3 F (37.4 C) (02/24 0400) Pulse Rate:  [45-134] 58 (02/24 0700) Resp:  [8-28] 11 (02/24 0700) BP: (92-153)/(39-82) 151/62 mmHg (02/24 0700) SpO2:  [92 %-100 %] 98 % (02/24 0700)  Intake/Output from previous day: 02/23 0701 - 02/24 0700 In: 1930 [P.O.:780; I.V.:1150] Out: 1900 [Urine:1900] Intake/Output this shift: Total I/O In: -  Out: 400 [Urine:400]  Alert, conversant, smiling. Son at bedside. Incisions without erythema, swelling, or drainage. Left drain site drained overnight ; now dry with dsd intact.  Up to chair yesterday, walked into hallway with Rehab nurse.   Lab Results: No results for input(s): WBC, HGB, HCT, PLT in the last 72 hours. BMET No results for input(s): NA, K, CL, CO2, GLUCOSE, BUN, CREATININE, CALCIUM in the last 72 hours.  Studies/Results: Ct Head Wo Contrast  10/21/2014   CLINICAL DATA:  81 year old male with subdural hematomas status post drainage. Initial encounter.  EXAM: CT HEAD WITHOUT CONTRAST  TECHNIQUE: Contiguous axial images were obtained from the base of the skull through the vertex without intravenous contrast.  COMPARISON:  Head CT 10/20/2014.  Brain MRI 10/16/2014.  FINDINGS: Bilateral percutaneous subdural drains remain in place. Stable visualized osseous structures. Visualized paranasal sinuses and mastoids are clear. Negative orbits soft tissues. Stable bilateral scalp soft tissues. Calcified atherosclerosis at the skull base.  Small volume of pneumocephalus has not significantly changed. Bilateral mixed density subdural hematomas persist, at the level of the septum callosum measuring up to 12-13 mm in thickness bilaterally, near the vertex measuring about 22 mm on the right and 19 mm on the left. Mass effect on both hemispheres and Trace rightward midline shift have not significantly changed. No  ventriculomegaly or intraventricular hemorrhage.  Extra-axial CSF in the posterior fossa is stable. 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 Stable gray-white matter differentiation throughout the brain. No evidence of cortically based acute infarction identified.  IMPRESSION: 1. Bilateral mixed density subdural hematomas persist and are stable since yesterday. Bilateral subdural drains remain. 2. Stable intracranial mass effect. 3. No new intracranial abnormality.   Electronically Signed   By: Odessa FlemingH  Hall M.D.   On: 10/21/2014 08:03    Assessment/Plan: Improving   LOS: 6 days  [facility]; to floor in meanwhile per Dr. Venetia MaxonStern.    Georgiann Cockeroteat, Mirna Sutcliffe 10/22/2014, 8:05 AM

## 2014-10-22 NOTE — Progress Notes (Signed)
Inpatient Diabetes Program Recommendations  AACE/ADA: New Consensus Statement on Inpatient Glycemic Control (2013)  Target Ranges:  Prepandial:   less than 140 mg/dL      Peak postprandial:   less than 180 mg/dL (1-2 hours)      Critically ill patients:  140 - 180 mg/dL   Reason for Assessment:Results for Mason Stevens, Keeyon (MRN [id number]) as of 10/22/2014 13:38  Ref. Range 10/21/2014 12:06 10/21/2014 17:48 10/21/2014 21:10 10/22/2014 07:46 10/22/2014 11:16  Glucose-Capillary Latest Range: 70-99 mg/dL 782237 (H) 956217 (H) 213226 (H) 198 (H) 194 (H)     Home diabetes medications:  NPH 4 units q AM and 8 units q PM.  Please consider restarting a portion of patient's home dose of NPH.  May consider NPH 4 units bid.  Thanks, Beryl MeagerJenny Evalette Montrose, RN, BC-ADM Inpatient Diabetes Coordinator Pager [phone]

## 2014-10-22 NOTE — Progress Notes (Signed)
Rehab admissions - I have received an initial denial from [facility] medicare.  Dr. Riley KillSwartz is appealing the denial.  I will contact PT to make sure that they get patient up and that patient ambulates.  Then we can submit to Manufacturing engineer[employer]urance medical director for review.  Hopefully we can get denial turned to approval tomorrow.  Call me for questions.  #[phone]

## 2014-10-23 LAB — GLUCOSE, CAPILLARY
GLUCOSE-CAPILLARY: 164 mg/dL — AB (ref 70–99)
Glucose-Capillary: 196 mg/dL — ABNORMAL HIGH (ref 70–99)
Glucose-Capillary: 193 mg/dL — ABNORMAL HIGH (ref 70–99)
Glucose-Capillary: 138 mg/dL — ABNORMAL HIGH (ref 70–99)

## 2014-10-23 MED ORDER — ADULT MULTIVITAMIN W/MINERALS CH
1.0000 | ORAL_TABLET | Freq: Every day | ORAL | Status: DC
  Administered 2014-10-23 – 2014-10-24 (×2): 1 via ORAL
  Filled 2014-10-23 (×2): qty 1

## 2014-10-23 MED ORDER — GLUCERNA SHAKE PO LIQD
237.0000 mL | ORAL | Status: DC
Start: 2014-10-23 — End: 2014-10-24
  Administered 2014-10-23: 237 mL via ORAL

## 2014-10-23 NOTE — Progress Notes (Signed)
NUTRITION FOLLOW UP  DOCUMENTATION CODES Per approved criteria  -Severe malnutrition in the context of acute illness or injury       Intervention:   Encourage PO intake Provide Glucerna Shake once daily, provides 220 kcal and 10 grams of protein Provide Multivitamin with minerals daily  Nutrition Dx:   Malnutrition related to acute illness as evidenced by severe fat and muscle depletion; ongoing  Goal:   Pt to meet >/= 90% of their estimated nutrition needs; met  Monitor:   PO intake, supplement acceptance, weight trends, labs  Assessment:   Pt admitted after 3 week of progressively worsening confusion and aphasia, pt had been independent until recently, with bilateral SDH. Pt s/p bilateral craniotomies with hematoma evacuation.   Patient reports having a good appetite and eating 100% of most meals. Pt states that he was previously having oral pain making it difficulty to eat and chew but, this has resolved. 100% meal completion confirmed by nursing notes. Per weight history, pt's weight has dropped 9 lbs.   Labs: elevated glucose ranging 148 to 363 mg/dL, low calcium, low hemoglobin  Height: Ht Readings from Last 1 Encounters:  10/20/14 5' 10"  (1.778 m)    Weight Status:   Wt Readings from Last 1 Encounters:  10/20/14 164 lb 0.4 oz (74.4 kg)   10/17/14 173 lb 8 oz (78.7 kg)         Re-estimated needs:  Kcal: 1950-2150 Protein: 100-115 grams Fluid: > 1.9 L/day  Skin: incision on head  Diet Order: DIET DYS 2, thin liquids  No intake or output data in the 24 hours ending 10/23/14 1247  Last BM: 2/24   Labs:   Recent Labs Lab 10/16/14 1625 10/17/14 0525 10/18/14 0507  NA 140 139 136  K 3.1* 3.6 3.7  CL 109 111 110  CO2 21 22 22   BUN 16 16 15   CREATININE 0.80 0.90 0.86  CALCIUM 8.5 8.3* 8.0*  MG  --  1.8 1.8  PHOS  --  2.4  --   GLUCOSE 180* 156* 188*    CBG (last 3)   Recent Labs  10/22/14 2157 10/23/14 0741 10/23/14 1211  GLUCAP  237* 196* 193*    Scheduled Meds: . amLODipine  10 mg Oral Daily  . docusate sodium  100 mg Oral BID  . hydrALAZINE  20 mg Oral BID  . hydrochlorothiazide  25 mg Oral Daily   And  . lisinopril  40 mg Oral Daily  . insulin aspart  0-15 Units Subcutaneous TID WC  . insulin aspart  0-5 Units Subcutaneous QHS  . levETIRAcetam  500 mg Oral BID  . metFORMIN  1,000 mg Oral BID WC  . pantoprazole  40 mg Oral Q1200  . pravastatin  10 mg Oral q1800  . senna  1 tablet Oral BID    Continuous Infusions: . sodium chloride 50 mL/hr at 10/22/14 [place], LDN Inpatient Clinical Dietitian Pager: [phone] After Hours Pager: [phone]

## 2014-10-23 NOTE — Progress Notes (Signed)
Physical Therapy Treatment Patient Details Name: Mason [name] MRN: [id number] DOB: 1927/07/17 Today's Date: 10/23/2014    History of Present Illness pt presents with Bil SDH s/p Bil Crani.  pt with recent hx of multiple falls.      PT Comments    Patient continues to be highly motivated and eager to work with therapy. Able to complete BERG balance assessment this session. Patient scored 29/56 indicating significant fall risk. Continue to recommend comprehensive inpatient rehab (CIR) for post-acute therapy needs.   Follow Up Recommendations  CIR     Equipment Recommendations  Rolling walker with 5" wheels    Recommendations for Other Services       Precautions / Restrictions Precautions Precautions: Fall Restrictions Weight Bearing Restrictions: No    Mobility  Bed Mobility         Supine to sit: Min guard     General bed mobility comments: MinGuard for safety  Transfers Overall transfer level: Needs assistance Equipment used: 1 person hand held assist   Sit to Stand: Min assist         General transfer comment: pt mildly unsteady and needs A for balance.  cues for safe technique.    Ambulation/Gait Ambulation/Gait assistance: Min assist Ambulation Distance (Feet): 100 Feet Assistive device: None Gait Pattern/deviations: Step-through pattern;Shuffle;Decreased stride length;Staggering right;Drifts right/left     General Gait Details: Patient continues to drift right side with ambulation. Continues with decreased stride length especially with L pain due to increased pain in heel of foot. Continues to need assistance with turns   Information systems managertairs            Wheelchair Mobility    Modified Rankin (Stroke Patients Only)       Balance                                    Cognition Arousal/Alertness: Awake/alert Behavior During Therapy: WFL for tasks assessed/performed Overall Cognitive Status: Impaired/Different from baseline Area  of Impairment: Awareness;Safety/judgement         Safety/Judgement: Decreased awareness of deficits   Problem Solving: Slow processing;Requires verbal cues General Comments: Improving but still unaware of balance deficits and R side lean with ambulation    Exercises      General Comments        Pertinent Vitals/Pain Pain Assessment: No/denies pain    Home Living                      Prior Function            PT Goals (current goals can now be found in the care plan section) Progress towards PT goals: Progressing toward goals    Frequency  Min 3X/week    PT Plan Current plan remains appropriate    Co-evaluation             End of Session Equipment Utilized During Treatment: Gait belt Activity Tolerance: Patient tolerated treatment well Patient left: in chair;with call bell/phone within reach;with family/visitor present;with chair alarm set     Time: [id number] PT Time Calculation (min) (ACUTE ONLY): 26 min  Charges:  $Gait Training: 8-22 mins $Therapeutic Activity: 8-22 mins                    G Codes:      Fredrich BirksRobinette, Julia Elizabeth 10/23/2014, 10:11 AM 10/23/2014 Fredrich Birksobinette, Julia Elizabeth PTA [phone] pager [phone] office

## 2014-10-23 NOTE — Progress Notes (Signed)
2/25 Inpatient Diabetes Program Recommendations  AACE/ADA: New Consensus Statement on Inpatient Glycemic Control (2013)  Target Ranges:  Prepandial:   less than 140 mg/dL      Peak postprandial:   less than 180 mg/dL (1-2 hours)      Critically ill patients:  140 - 180 mg/dL   In addition to the correction, pt would benefit from at least some of his home NPH.  Glucose levels are not controlled. Inpatient Diabetes Program Recommendations Insulin - Basal: consider adding NPH 4 units BID  Thank you Lenor CoffinAnn Tearsa Kowalewski, RN, MSN, CDE  Diabetes Inpatient Program Office: [phone] Pager: [phone]

## 2014-10-23 NOTE — Progress Notes (Signed)
Subjective: Patient reports , laughing, "I don't want you pulling any more drains out of my head either!"  Objective: Vital signs in last 24 hours: Temp:  [98.4 F (36.9 C)-99.2 F (37.3 C)] 99.2 F (37.3 C) (02/25 0545) Pulse Rate:  [59-76] 60 (02/25 0545) Resp:  [20-26] 20 (02/25 0545) BP: (140-172)/(57-78) 154/62 mmHg (02/25 0545) SpO2:  [95 %-100 %] 97 % (02/25 0545)  Intake/Output from previous day: 02/24 0701 - 02/25 0700 In: 400 [P.O.:300; I.V.:100] Out: 400 [Urine:400] Intake/Output this shift:    Alert, conversant. Walking with PT in room. Incisions dry, without erythema or swelling. Pt slept poorly last night d/t frequent urination. Otherwise doing well & hopeful of CIR admission.  Lab Results: No results for input(s): WBC, HGB, HCT, PLT in the last 72 hours. BMET No results for input(s): NA, K, CL, CO2, GLUCOSE, BUN, CREATININE, CALCIUM in the last 72 hours.  Studies/Results: No results found.  Assessment/Plan: Improving   LOS: 7 days  CIR denial appeal in process. Hopeful of CIR admission today.  Will discuss urinary frequency/urge incontinence with DrStern.   Georgiann Cockeroteat, Dairon Procter 10/23/2014, 7:58 AM   Addendum.Marland Kitchen.Marland Kitchen.Per DrStern, will hold Flomax.

## 2014-10-23 NOTE — Progress Notes (Signed)
Rehab admissions - I have again received a denial for inpatient rehab admission.  I faxed additional notes showing that the patient was ambulating and participating.  The insurance company advised me to open a new case with insurance carrier for patient.  I have opened a new case and have faxed updated information.  I spoke with patient's son and informed son of denial.  Son is now Engineer, structuralcalling insurance carrier as well.  I will update all when I again hear back from insurance carrier.  Call me for questions.  #[phone]

## 2014-10-24 ENCOUNTER — Inpatient Hospital Stay (HOSPITAL_COMMUNITY)
Admission: RE | Admit: 2014-10-24 | Discharge: 2014-10-30 | DRG: 093 | Disposition: A | Discharge status: Home Health | Payer: Medicare HMO | Source: Intra-hospital | Attending: Physical Medicine & Rehabilitation | Admitting: Physical Medicine & Rehabilitation

## 2014-10-24 DIAGNOSIS — M7732 Calcaneal spur, left foot: Secondary | ICD-10-CM | POA: Diagnosis present

## 2014-10-24 DIAGNOSIS — K5900 Constipation, unspecified: Secondary | ICD-10-CM | POA: Diagnosis present

## 2014-10-24 DIAGNOSIS — I4891 Unspecified atrial fibrillation: Secondary | ICD-10-CM | POA: Diagnosis present

## 2014-10-24 DIAGNOSIS — M722 Plantar fascial fibromatosis: Secondary | ICD-10-CM | POA: Diagnosis present

## 2014-10-24 DIAGNOSIS — S065X2A Traumatic subdural hemorrhage with loss of consciousness of 31 minutes to 59 minutes, initial encounter: Secondary | ICD-10-CM | POA: Diagnosis present

## 2014-10-24 DIAGNOSIS — E785 Hyperlipidemia, unspecified: Secondary | ICD-10-CM | POA: Diagnosis present

## 2014-10-24 DIAGNOSIS — N401 Enlarged prostate with lower urinary tract symptoms: Secondary | ICD-10-CM | POA: Diagnosis present

## 2014-10-24 DIAGNOSIS — W19XXXS Unspecified fall, sequela: Secondary | ICD-10-CM | POA: Diagnosis present

## 2014-10-24 DIAGNOSIS — I10 Essential (primary) hypertension: Secondary | ICD-10-CM | POA: Diagnosis present

## 2014-10-24 DIAGNOSIS — G8929 Other chronic pain: Secondary | ICD-10-CM

## 2014-10-24 DIAGNOSIS — E1142 Type 2 diabetes mellitus with diabetic polyneuropathy: Secondary | ICD-10-CM | POA: Diagnosis present

## 2014-10-24 DIAGNOSIS — I6200 Nontraumatic subdural hemorrhage, unspecified: Secondary | ICD-10-CM

## 2014-10-24 DIAGNOSIS — R1319 Other dysphagia: Secondary | ICD-10-CM | POA: Diagnosis present

## 2014-10-24 DIAGNOSIS — S065X0S Traumatic subdural hemorrhage without loss of consciousness, sequela: Secondary | ICD-10-CM | POA: Diagnosis not present

## 2014-10-24 DIAGNOSIS — M79672 Pain in left foot: Secondary | ICD-10-CM

## 2014-10-24 DIAGNOSIS — R350 Frequency of micturition: Secondary | ICD-10-CM | POA: Diagnosis present

## 2014-10-24 DIAGNOSIS — E119 Type 2 diabetes mellitus without complications: Secondary | ICD-10-CM

## 2014-10-24 DIAGNOSIS — R296 Repeated falls: Secondary | ICD-10-CM | POA: Diagnosis present

## 2014-10-24 LAB — GLUCOSE, CAPILLARY
GLUCOSE-CAPILLARY: 158 mg/dL — AB (ref 70–99)
Glucose-Capillary: 182 mg/dL — ABNORMAL HIGH (ref 70–99)
Glucose-Capillary: 208 mg/dL — ABNORMAL HIGH (ref 70–99)
Glucose-Capillary: 134 mg/dL — ABNORMAL HIGH (ref 70–99)

## 2014-10-24 MED ORDER — LEVETIRACETAM 500 MG PO TABS
500.0000 mg | ORAL_TABLET | Freq: Two times a day (BID) | ORAL | Status: DC
  Administered 2014-10-24 – 2014-10-30 (×12): 500 mg via ORAL
  Filled 2014-10-24 (×14): qty 1

## 2014-10-24 MED ORDER — PRAVASTATIN SODIUM 10 MG PO TABS
10.0000 mg | ORAL_TABLET | Freq: Every day | ORAL | Status: DC
  Administered 2014-10-24 – 2014-10-29 (×6): 10 mg via ORAL
  Filled 2014-10-24 (×7): qty 1

## 2014-10-24 MED ORDER — ONDANSETRON HCL 4 MG/2ML IJ SOLN: 4.0000 mg | Freq: Four times a day (QID) | INTRAMUSCULAR | Status: DC | PRN

## 2014-10-24 MED ORDER — ADULT MULTIVITAMIN W/MINERALS CH
1.0000 | ORAL_TABLET | Freq: Every day | ORAL | Status: DC
  Administered 2014-10-25 – 2014-10-30 (×6): 1 via ORAL
  Filled 2014-10-24 (×7): qty 1

## 2014-10-24 MED ORDER — INSULIN ASPART 100 UNIT/ML ~~LOC~~ SOLN
0.0000 [IU] | Freq: Three times a day (TID) | SUBCUTANEOUS | Status: DC
Start: 2014-10-25 — End: 2014-10-30
  Administered 2014-10-25: 2 [IU] via SUBCUTANEOUS
  Administered 2014-10-25: 5 [IU] via SUBCUTANEOUS
  Administered 2014-10-25 – 2014-10-26 (×2): 3 [IU] via SUBCUTANEOUS
  Administered 2014-10-26: 2 [IU] via SUBCUTANEOUS
  Administered 2014-10-26: 5 [IU] via SUBCUTANEOUS
  Administered 2014-10-27 (×2): 3 [IU] via SUBCUTANEOUS
  Administered 2014-10-27: 2 [IU] via SUBCUTANEOUS
  Administered 2014-10-28 – 2014-10-29 (×3): 3 [IU] via SUBCUTANEOUS
  Administered 2014-10-29: 2 [IU] via SUBCUTANEOUS
  Administered 2014-10-29 – 2014-10-30 (×2): 3 [IU] via SUBCUTANEOUS
  Administered 2014-10-30: 2 [IU] via SUBCUTANEOUS

## 2014-10-24 MED ORDER — ONDANSETRON HCL 4 MG PO TABS: 4.0000 mg | ORAL_TABLET | Freq: Four times a day (QID) | ORAL | Status: DC | PRN

## 2014-10-24 MED ORDER — GLUCERNA SHAKE PO LIQD
237.0000 mL | ORAL | Status: DC
  Administered 2014-10-27 – 2014-10-29 (×3): 237 mL via ORAL

## 2014-10-24 MED ORDER — HYDROCHLOROTHIAZIDE 25 MG PO TABS
25.0000 mg | ORAL_TABLET | Freq: Every day | ORAL | Status: DC
  Administered 2014-10-25 – 2014-10-30 (×6): 25 mg via ORAL
  Filled 2014-10-24 (×7): qty 1

## 2014-10-24 MED ORDER — METFORMIN HCL 500 MG PO TABS
1000.0000 mg | ORAL_TABLET | Freq: Two times a day (BID) | ORAL | Status: DC
  Administered 2014-10-25 – 2014-10-30 (×11): 1000 mg via ORAL
  Filled 2014-10-24 (×13): qty 2

## 2014-10-24 MED ORDER — BISACODYL 10 MG RE SUPP: 10.0000 mg | Freq: Every day | RECTAL | Status: DC | PRN

## 2014-10-24 MED ORDER — DICLOFENAC SODIUM 1 % TD GEL
2.0000 g | Freq: Four times a day (QID) | TRANSDERMAL | Status: DC | PRN
  Administered 2014-10-30: 2 g via TOPICAL
  Filled 2014-10-24 (×2): qty 100

## 2014-10-24 MED ORDER — PANTOPRAZOLE SODIUM 40 MG PO TBEC
40.0000 mg | DELAYED_RELEASE_TABLET | Freq: Every day | ORAL | Status: DC
  Administered 2014-10-25 – 2014-10-30 (×6): 40 mg via ORAL
  Filled 2014-10-24 (×6): qty 1

## 2014-10-24 MED ORDER — HYDROCODONE-ACETAMINOPHEN 5-325 MG PO TABS
1.0000 | ORAL_TABLET | ORAL | Status: DC | PRN
  Administered 2014-10-25 – 2014-10-29 (×3): 1 via ORAL
  Filled 2014-10-24 (×3): qty 1

## 2014-10-24 MED ORDER — SORBITOL 70 % SOLN: 30.0000 mL | Freq: Every day | Status: DC | PRN

## 2014-10-24 MED ORDER — LISINOPRIL 40 MG PO TABS
40.0000 mg | ORAL_TABLET | Freq: Every day | ORAL | Status: DC
  Administered 2014-10-25 – 2014-10-30 (×6): 40 mg via ORAL
  Filled 2014-10-24 (×7): qty 1

## 2014-10-24 MED ORDER — POLYETHYLENE GLYCOL 3350 17 G PO PACK
17.0000 g | PACK | Freq: Every day | ORAL | Status: DC | PRN
  Filled 2014-10-24: qty 1

## 2014-10-24 MED ORDER — HYDRALAZINE HCL 10 MG PO TABS
20.0000 mg | ORAL_TABLET | Freq: Two times a day (BID) | ORAL | Status: DC
  Administered 2014-10-24 – 2014-10-30 (×12): 20 mg via ORAL
  Filled 2014-10-24 (×14): qty 2

## 2014-10-24 MED ORDER — DOCUSATE SODIUM 100 MG PO CAPS
100.0000 mg | ORAL_CAPSULE | Freq: Two times a day (BID) | ORAL | Status: DC
  Administered 2014-10-24 – 2014-10-30 (×11): 100 mg via ORAL
  Filled 2014-10-24 (×14): qty 1

## 2014-10-24 MED ORDER — ACETAMINOPHEN 325 MG PO TABS
325.0000 mg | ORAL_TABLET | ORAL | Status: DC | PRN
  Administered 2014-10-24 – 2014-10-30 (×17): 650 mg via ORAL
  Filled 2014-10-24 (×17): qty 2

## 2014-10-24 MED ORDER — AMLODIPINE BESYLATE 10 MG PO TABS
10.0000 mg | ORAL_TABLET | Freq: Every day | ORAL | Status: DC
  Administered 2014-10-25 – 2014-10-30 (×6): 10 mg via ORAL
  Filled 2014-10-24 (×7): qty 1

## 2014-10-24 NOTE — Progress Notes (Signed)
Rehab admissions - I have approval for acute inpatient rehab admission for today.  Bed available and will admit to inpatient rehab today.  Call me for questions.  #[phone] 

## 2014-10-24 NOTE — Progress Notes (Signed)
Subjective: Patient reports "I feel pretty good"  Objective: Vital signs in last 24 hours: Temp:  [97.5 F (36.4 C)-98.7 F (37.1 C)] 97.9 F (36.6 C) (02/26 0651) Pulse Rate:  [57-87] 71 (02/26 0651) Resp:  [18-20] 18 (02/26 0651) BP: (129-151)/(50-71) 151/69 mmHg (02/26 0651) SpO2:  [93 %-100 %] 98 % (02/26 0651)  Intake/Output from previous day: 02/25 0701 - 02/26 0700 In: -  Out: 375 [Urine:375] Intake/Output this shift:    Alert, sitting in chair, reading newspaper. Reports no pain. incisions with staples inatct, no erythema, swelling, or drainage. MAEW. PEARL. Son reports call this morning confirmed insurance approval of CIR.  Lab Results: No results for input(s): WBC, HGB, HCT, PLT in the last 72 hours. BMET No results for input(s): NA, K, CL, CO2, GLUCOSE, BUN, CREATININE, CALCIUM in the last 72 hours.  Studies/Results: No results found.  Assessment/Plan: Improving   LOS: 8 days  Transfer to CIR when approval complete.   Mason Stevens, Mason Stevens 10/24/2014, 8:19 AM

## 2014-10-24 NOTE — Discharge Summary (Signed)
Physician Discharge Summary  Patient ID: Mason Stevens Sabo MRN: [id number] DOB/AGE: November 10, 1926 81 y.o.  Admit date: 10/16/2014 Discharge date: 10/24/2014  Admission Diagnoses: Bilateral Subdural Hematoma   Discharge Diagnoses: Bilateral Subdural Hematoma s/p BILATERALCRANIOTOMY HEMATOMA EVACUATION SUBDURAL  Active Problems:   Subdural hematoma   Atrial fibrillation   Type 2 diabetes mellitus without complication   Anemia due to other cause   Protein-calorie malnutrition, severe   Discharged Condition: good  Hospital Course: Mason Stevens Mangine was admitted via ED transfer [facility] after progressively worsening confusion, falling and balance problems, formerly independent until falls, hitting head several times. Imaging revealed bilateral subdural hematomas. Following bilateral craniotomies for evacuation of SDH, he recovered well, progressing slowly but steadily in Neuro ICU, and [facility]. Upon insurance approval of CIR, pt will be transferred. Of note, atrial fibrillation has caused periodic bradycardia, but he has not been symptomatic. PCCM has followed. Anticoagulation contraindicated at present d/t recurrent bleeding risk.   Consults: PCCM for atrial fibrillation  Significant Diagnostic Studies: radiology: MRI/CT pre & post-op  Treatments: surgery: BILATERALCRANIOTOMY HEMATOMA EVACUATION SUBDURAL (Bilateral)  Discharge Exam: Blood pressure 151/69, pulse 71, temperature 97.9 F (36.6 C), temperature source Oral, resp. rate 18, height 5\' 10"  (1.778 m), weight 74.4 kg (164 lb 0.4 oz), SpO2 98 %. Alert, sitting in chair, reading newspaper. Reports no pain. incisions with staples inatct, no erythema, swelling, or drainage. MAEW. PEARL. Working daily with PT/OT. Pt verbalizes understanding of incision care (clean & dry, showers ok , staples out in a couple weeks) & plan for transfer to CIR.    Disposition: Discharge to CIR. He will follow up in office with Dr. Venetia MaxonStern after rehab  complete.      Medication List    ASK your doctor about these medications        amLODipine 10 MG tablet  Commonly known as:  NORVASC  Take 10 mg by mouth daily.     hydrALAZINE 10 MG tablet  Commonly known as:  APRESOLINE  Take 20 mg by mouth 2 (two) times daily.     insulin NPH Human 100 UNIT/ML injection  Commonly known as:  HUMULIN N,NOVOLIN N  - Inject 4-8 Units into the skin daily. Take 4 units every morning  - Take 8 units every evening     lisinopril-hydrochlorothiazide 20-12.5 MG per tablet  Commonly known as:  PRINZIDE,ZESTORETIC  Take 2 tablets by mouth daily.     lovastatin 20 MG tablet  Commonly known as:  MEVACOR  Take 40 mg by mouth at bedtime.     metFORMIN 1000 MG tablet  Commonly known as:  GLUCOPHAGE  Take 1,000 mg by mouth 2 (two) times daily with a meal.         Signed: Georgiann Cockeroteat, Trevel Dillenbeck 10/24/2014, 8:32 AM

## 2014-10-24 NOTE — H&P (Signed)
Physical Medicine and Rehabilitation Admission H&P   Chief complaint: Weaknes  ZOX:WRUEAVW Mason Stevens is a 81 y.o. right handed male with history of hypertension, diabetes mellitus and peripheral neuropathy. Admitted 10/16/2014 with frequent falls as well as altered mental status. Initially presented to [facility] Cranial CT scan showed large bilateral subdural hematomas. Patient was transferred to [facility] and underwent bilateral craniotomy hematoma evacuation of subdural hematoma 10/16/2014 per Dr. Venetia Maxon. Placed on Keppra for seizure prophylaxis. Follow-up cranial CT scan 10/21/2014 with no acute changes. Patient with new onset atrial fibrillation. Echocardiogram with ejection fraction of 55% no wall motion abnormalities. Troponin negative. Plan to begin aspirin therapy once cleared by neurosurgery. Tolerating a regular dysphagia 2 thin liquids. Physical and occupational therapy evaluations completed M.D. has requested physical medicine rehabilitation consult. Patient was admitted for comprehensive rehabilitation program    ROS ROS Review of Systems  Gastrointestinal: Positive for constipation.  Musculoskeletal: Positive for falls.  Neurological: Positive for headaches.  All other systems reviewed and are negative History reviewed. No pertinent past medical history. Past Surgical History  Procedure Laterality Date  . Craniotomy Bilateral 10/16/2014    Procedure: BILATERALCRANIOTOMY HEMATOMA EVACUATION SUBDURAL; Surgeon: Maeola Harman, MD; Location: [facility]; Service: Neurosurgery; Laterality: Bilateral; BILATERALCRANIOTOMY HEMATOMA EVACUATION SUBDURAL   History reviewed. No pertinent family history. Social History:  has no tobacco, alcohol, and drug history on file. Allergies: No Active Allergies Medications Prior to Admission  Medication Sig Dispense Refill  . amLODipine (NORVASC) 10 MG tablet Take 10 mg by mouth daily.     . hydrALAZINE (APRESOLINE) 10 MG tablet Take 20 mg by mouth 2 (two) times daily.    . insulin NPH Human (HUMULIN N,NOVOLIN N) 100 UNIT/ML injection Inject 4-8 Units into the skin daily. Take 4 units every morning Take 8 units every evening    . lisinopril-hydrochlorothiazide (PRINZIDE,ZESTORETIC) 20-12.5 MG per tablet Take 2 tablets by mouth daily.    Marland Kitchen lovastatin (MEVACOR) 20 MG tablet Take 40 mg by mouth at bedtime.    . metFORMIN (GLUCOPHAGE) 1000 MG tablet Take 1,000 mg by mouth 2 (two) times daily with a meal.      Home: Home Living Family/patient expects to be discharged to:: Inpatient rehab Living Arrangements: Alone, Spouse/significant other, Other (Comment) (cares for his wife but now family doing this) Type of Home: House Lives With: Spouse  Functional History: Prior Function Level of Independence: Independent Comments: pt indicates recently had been given a cane and Rollator after his falls.   Functional Status:  Mobility: Bed Mobility Overal bed mobility: Needs Assistance Bed Mobility: Supine to Sit Supine to sit: Min assist General bed mobility comments: Min A for safety and to ensure balance once EOB.  Transfers Overall transfer level: Needs assistance Equipment used: 1 person hand held assist Transfers: Sit to/from Stand Sit to Stand: Min assist Stand pivot transfers: Min assist General transfer comment: pt mildly unsteady and needs A for balance. cues for safe technique.  Ambulation/Gait Ambulation/Gait assistance: Min assist, Mod assist Ambulation Distance (Feet): 90 Feet Assistive device: None (patient pushing IV pole) Gait Pattern/deviations: Step-through pattern, Decreased stride length, Shuffle, Trunk flexed, Staggering right, Narrow base of support Gait velocity interpretation: Below normal speed for age/gender General Gait Details: Patient initiated with shuffle gait and flexed posture. Patient stagerring R side with up  to Mod A to correct and to regain balance back to midline. Min A for balance with turns. Patient cued to increase step length and BOS.  ADL: ADL Overall ADL's : Needs assistance/impaired Eating/Feeding: Set up, Sitting Grooming: Wash/dry face, Wash/dry hands, Oral care, Set up, Sitting Upper Body Bathing: Set up, Sitting Lower Body Bathing: Minimal assistance, Sit to/from stand Lower Body Bathing Details (indicate cue type and reason): min assist for balance when coming sit to stand and when doing all adls in standing. Upper Body Dressing : Set up, Sitting Lower Body Dressing: Moderate assistance, Sit to/from stand Lower Body Dressing Details (indicate cue type and reason): Pt needs min assist to come to stand and mod assist to be able to let go of the walker to fasten pants etc.  Toilet Transfer: Minimal assistance, Ambulation, BSC Toilet Transfer Details (indicate cue type and reason): min assist for balance. Toileting- Clothing Manipulation and Hygiene: Minimal assistance, Sit to/from stand, Cueing for safety Toileting - Clothing Manipulation Details (indicate cue type and reason): min assist to maintain balance while standing to clean self. Functional mobility during ADLs: Minimal assistance, Cueing for safety, +2 for safety/equipment General ADL Comments: Pt overall doing better with adls. Pt with some decreased balance when on his feet during adls and safety issues when up. Pt with mild impulsivity when standing stepping on cords etc when walking.  Cognition: Cognition Overall Cognitive Status: Impaired/Different from baseline Arousal/Alertness: Awake/alert Orientation Level: Oriented X4 Attention: Sustained Sustained Attention: Appears intact Memory: Appears intact (require only 1 semantic cue for 4 word recall) Awareness: Appears intact Problem Solving: (basic functiona, will assess higher level) Safety/Judgment: Appears intact Rancho Mirant Scales of Cognitive  Functioning: Automatic/appropriate Cognition Arousal/Alertness: Awake/alert Behavior During Therapy: WFL for tasks assessed/performed Overall Cognitive Status: Impaired/Different from baseline Area of Impairment: Awareness, Safety/judgement Safety/Judgement: Decreased awareness of deficits Awareness: Emergent Problem Solving: Slow processing, Requires verbal cues General Comments: Improving but still unaware of balance deficits and R side lean with ambulation  Physical Exam: Blood pressure 154/62, pulse 60, temperature 99.2 F (37.3 C), temperature source Oral, resp. rate 20, height 5\' 10"  (1.778 m), weight 74.4 kg (164 lb 0.4 oz), SpO2 97 %. Physical Exam Constitutional: He is oriented to person, place, and time.  Eyes: EOM are normal.  Neck: Normal range of motion. Neck supple. No thyromegaly present.  Cardiovascular:  Cardiac rate controlled  Respiratory: Effort normal and breath sounds normal. No respiratory distress.  GI: Soft. Bowel sounds are normal. He exhibits no distension.  Neurological: He is alert and oriented to person, place, and time.  Follows commands. Fair awareness of deficits. Alert. Moves all 4's. Sitting upright in chair.  Skin:  Craniotomy site dressed. Psychiatric:  Fairly pleasant and appropriate  Motor strength 4/5 in BUE and BLE   Lab Results Last 48 Hours    Results for orders placed or performed during the hospital encounter of 10/16/14 (from the past 48 hour(s))  Glucose, capillary Status: Abnormal   Collection Time: 10/21/14 8:11 AM  Result Value Ref Range   Glucose-Capillary 163 (H) 70 - 99 mg/dL  Glucose, capillary Status: Abnormal   Collection Time: 10/21/14 12:06 PM  Result Value Ref Range   Glucose-Capillary 237 (H) 70 - 99 mg/dL  Glucose, capillary Status: Abnormal   Collection Time: 10/21/14 5:48 PM  Result Value Ref Range   Glucose-Capillary 217 (H) 70 - 99 mg/dL   Comment 1  Notify RN    Comment 2 Documented in Char   Glucose, capillary Status: Abnormal   Collection Time: 10/21/14 9:10 PM  Result Value Ref Range   Glucose-Capillary 226 (H) 70 - 99 mg/dL  Glucose,  capillary Status: Abnormal   Collection Time: 10/22/14 7:46 AM  Result Value Ref Range   Glucose-Capillary 198 (H) 70 - 99 mg/dL   Comment 1 Notify RN    Comment 2 Documented in Char   Glucose, capillary Status: Abnormal   Collection Time: 10/22/14 11:16 AM  Result Value Ref Range   Glucose-Capillary 194 (H) 70 - 99 mg/dL  Glucose, capillary Status: Abnormal   Collection Time: 10/22/14 4:06 PM  Result Value Ref Range   Glucose-Capillary 163 (H) 70 - 99 mg/dL  Glucose, capillary Status: Abnormal   Collection Time: 10/22/14 9:57 PM  Result Value Ref Range   Glucose-Capillary 237 (H) 70 - 99 mg/dL   Comment 1 Notify RN    Comment 2 Documented in Char       Imaging Results (Last 48 hours)    No results found.       Medical Problem List and Plan:  1. Functional deficits secondary to traumatic bilateral subdural hematoma status post craniotomies 10/16/2014 2. DVT Prophylaxis/Anticoagulation: Sequential compression devices. No anticoagulation at this time due to SDH 3. Pain Management: Hydrocodone as needed. Monitor with increased mobility 4. Seizure prophylaxis. Keppra 500 mg twice a day. Monitor for any seizure activity 5. Neuropsych: This patient is capable of making decisions on his own behalf. 6. Skin/Wound Care: Routine skin checks 7. Fluids/Electrolytes/Nutrition: Strict I&O will follow chemistries 8. Dysphagia. Dysphagia 2 thin liquids. Follow-up speech therapy 9. Hypertension. Norvasc 10 mg daily, hydralazine 20 mg twice a day, lisinopril 40 mg daily and hydrochlorothiazide 25 mg daily. Monitor with increased mobility 10. New onset atrial fibrillation. Troponin negative. Cardiac rate  control. Plan aspirin therapy once cleared by neurosurgery 11. Diabetes mellitus with peripheral neuropathy.Glucophage 1000 mg twice a day. Check blood sugars before meals and at bedtime 12. BPH. Flomax 0.4 mg daily. Check PVR 3 13.Hyperlipedemia. Pravachol     Post Admission Physician Evaluation: Functional deficits secondary to traumatic bilateral subdural hematoma status post craniotomies 10/16/2014 1. . 2. Patient is admitted to receive collaborative, interdisciplinary care between the physiatrist, rehab nursing staff, and therapy team. 3. Patient's level of medical complexity and substantial therapy needs in context of that medical necessity cannot be provided at a lesser intensity of care such as a SNF. 4. Patient has experienced substantial functional loss from his/her baseline which was documented above under the "Functional History" and "Functional Status" headings. Judging by the patient's diagnosis, physical exam, and functional history, the patient has potential for functional progress which will result in measurable gains while on inpatient rehab. These gains will be of substantial and practical use upon discharge in facilitating mobility and self-care at the household level. 5. Physiatrist will provide 24 hour management of medical needs as well as oversight of the therapy plan/treatment and provide guidance as appropriate regarding the interaction of the two. 6. 24 hour rehab nursing will assist with bladder management, bowel management, safety, skin/wound care, disease management, medication administration, pain management and patient education and help integrate therapy concepts, techniques,education, etc. 7. PT will assess and treat for/with: pre gait, gait training, endurance , safety, equipment, neuromuscular re education Goals are: Sup 8. OT will assess and treat for/with: ADLs, Cognitive perceptual skills, Neuromuscular re education, safety, endurance, equipment Goals  are: Supervision  May  shower this patient. 9. SLP will assess and treat for/with: Cognition  Goals are Supervision 10. Case Management and Social Worker will assess and treat for psychological issues and discharge planning. 11. Team conference will be held weekly  to assess progress toward goals and to determine barriers to discharge. 12. Patient will receive at least 3 hours of therapy per day at least 5 days per week. 13. ELOS: 10-14d 14. Prognosis: good     Erick ColaceAndrew E. Lovelle Deitrick M.D. [facility] FAAPM&R (Sports Med, Neuromuscular Med) Diplomate Am Board of Electrodiagnostic Med  10/23/2014

## 2014-10-24 NOTE — Progress Notes (Signed)
Retta Diones, RN Rehab Admission Coordinator Signed Physical Medicine and Rehabilitation PMR Pre-admission 10/21/2014 12:47 PM  Related encounter: Admission (Current) from 10/16/2014 in [place]   PMR Admission Coordinator Pre-Admission Assessment  Patient: Mason Stevens is an 81 y.o., male MRN: [id number] DOB: Dec 16, 1926 Height: _0  (177.8 cm) Weight: 74.4 kg (164 lb 0.4 oz) (family reports this is about his baseline weight)  Insurance Information HMO: PPO: Yes PCP: IPA: 80/20: OTHER:  PRIMARY: [employer] Medicare Policy#: Elvin So Subscriber: [place] Name: Su Hoff Phone#: [phone] Fax#: [phone] Pre-Cert#: [id number] X 7 days with update due 10/29/14 Employer: Retired Benefits: Phone #: [phone] Name: Automated Eff. Date: 08/29/14 Deduct: $0 Out of Pocket Max: [phone] (met $142.45) Life Max: unlimited CIR: $285 days 1-6 SNF: $0 days 1-20; $160 days 21-100 Outpatient: No visit limits Co-Pay: $40/visit Home Health: 100% Co-Pay: none DME: 80% Co-Pay: 20% Providers: In network  Emergency [place]    Name [place] M Spouse [place] Son   [phone]   Ashtan, Girtman   [phone]   Leona, Pressly    [phone]     Current Medical History  Patient Admitting Diagnosis: B SDH with B craniotomies  History of Present Illness: An 81 y.o. right handed male with history of hypertension, diabetes mellitus and peripheral neuropathy. Admitted 10/16/2014 with frequent falls as well as altered mental status.  Initially presented to [facility]. Cranial CT scan showed large bilateral subdural hematomas. Patient was transferred to [facility] and underwent bilateral craniotomy hematoma evacuation of subdural hematoma 10/16/2014 per Dr. Vertell Limber. Placed on Keppra for seizure prophylaxis. Follow-up cranial CT scan 10/21/2014 with no acute changes. Patient with new onset atrial fibrillation. Echocardiogram with ejection fraction of 55% no wall motion abnormalities. Troponin negative. Plan to begin aspirin therapy once cleared by neurosurgery. Tolerating a regular dysphagia 2 thin liquids. Physical and occupational therapy evaluations completed and M.D. requested physical medicine rehabilitation consult. Patient to be admitted for comprehensive inpatient rehabilitation program.   Total: 4=NIH  Past Medical History  History reviewed. No pertinent past medical history.  Family History  family history is not on file.  Prior Rehab/Hospitalizations: None  Current Medications   Current facility-administered medications:  . 0.9 % sodium chloride infusion, , Intravenous, Continuous, Raylene Miyamoto, MD, Last Rate: 50 mL/hr at 10/23/14 2204, 1,000 mL at 10/23/14 2204 . acetaminophen (TYLENOL) tablet 650 mg, 650 mg, Oral, Q4H PRN, 650 mg at 10/22/14 1129 **OR** acetaminophen (TYLENOL) suppository 650 mg, 650 mg, Rectal, Q4H PRN, Erline Levine, MD . amLODipine (NORVASC) tablet 10 mg, 10 mg, Oral, Daily, Erline Levine, MD, 10 mg at 10/23/14 1010 . bisacodyl (DULCOLAX) suppository 10 mg, 10 mg, Rectal, Daily PRN, Erline Levine, MD, 10 mg at 10/18/14 1604 . diclofenac sodium (VOLTAREN) 1 % transdermal gel 2 g, 2 g, Topical, QID PRN, Erline Levine, MD, 2 g at 10/22/14 2005 . docusate sodium (COLACE) capsule 100 mg, 100 mg, Oral, BID, Erline Levine, MD, 100 mg at 10/23/14 2201 . feeding supplement (GLUCERNA SHAKE) (GLUCERNA SHAKE) liquid 237 mL, 237 mL, Oral, Q24H, Baird Lyons, RD, 237 mL at 10/23/14 1952 . hydrALAZINE (APRESOLINE) injection 10-40 mg, 10-40 mg, Intravenous, Q4H PRN, Rahul P Desai, PA-C, 20 mg at 10/21/14 1030 . hydrALAZINE (APRESOLINE) tablet 20 mg, 20 mg, Oral, BID, Erline Levine, MD, 20 mg at 10/23/14  2201 . lisinopril (PRINIVIL,ZESTRIL) tablet 40 mg, 40 mg, Oral, Daily, 40 mg at 10/23/14 1009 **AND** hydrochlorothiazide (HYDRODIURIL) tablet 25 mg, 25 mg, Oral, Daily, Juanito Doom, MD, 25 mg at 10/23/14 1009 . HYDROcodone-acetaminophen (NORCO/VICODIN) 5-325 MG per tablet 1 tablet, 1 tablet, Oral, Q4H PRN, Erline Levine, MD, 1 tablet at 10/24/14 [phone] . insulin aspart (novoLOG) injection 0-15 Units, 0-15 Units, Subcutaneous, TID WC, Chesley Mires, MD, 3 Units at 10/24/14 [phone] . insulin aspart (novoLOG) injection 0-5 Units, 0-5 Units, Subcutaneous, QHS, Chesley Mires, MD, 2 Units at 10/22/14 2209 . labetalol (NORMODYNE,TRANDATE) injection 10-40 mg, 10-40 mg, Intravenous, Q10 min PRN, Erline Levine, MD . levETIRAcetam (KEPPRA) tablet 500 mg, 500 mg, Oral, BID, Erline Levine, MD, 500 mg at 10/23/14 2201 . metFORMIN (GLUCOPHAGE) tablet 1,000 mg, 1,000 mg, Oral, BID WC, Juanito Doom, MD, 1,000 mg at 10/24/14 0756 . morphine 2 MG/ML injection 1-2 mg, 1-2 mg, Intravenous, Q2H PRN, Erline Levine, MD, 1 mg at 10/17/14 0323 . multivitamin with minerals tablet 1 tablet, 1 tablet, Oral, Daily, Baird Lyons, RD, 1 tablet at 10/23/14 1747 . ondansetron (ZOFRAN) tablet 4 mg, 4 mg, Oral, Q4H PRN **OR** ondansetron (ZOFRAN) injection 4 mg, 4 mg, Intravenous, Q4H PRN, Erline Levine, MD . pantoprazole (PROTONIX) EC tablet 40 mg, 40 mg, Oral, Q1200, Chesley Mires, MD, 40 mg at 10/23/14 1232 . polyethylene glycol (MIRALAX / GLYCOLAX) packet 17 g, 17 g, Oral, Daily PRN, Erline Levine, MD, 17 g at 10/23/14 0819 . pravastatin (PRAVACHOL) tablet 10 mg, 10 mg, Oral, q1800, Erline Levine, MD, 10 mg at 10/23/14 1745 . promethazine (PHENERGAN) tablet 12.5-25  mg, 12.5-25 mg, Oral, Q4H PRN, Erline Levine, MD . senna [facility]) tablet 8.6 mg, 1 tablet, Oral, BID, Erline Levine, MD, 8.6 mg at 10/23/14 2201  Patients Current Diet: DIET DYS 2  Precautions / Restrictions Precautions Precautions: Fall Precaution Comments: pt with Bil JP drains from Bil Crani incisions.  Restrictions Weight Bearing Restrictions: No Other Position/Activity Restrictions: Current order for L side-lying or up to chair only.    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   Home Assistive Devices / Equipment Home Assistive Devices/Equipment: Cane (specify quad or straight), Walker (specify type), Grab bars in shower  Prior Functional Level Prior Function Level of Independence: Independent Comments: pt indicates recently had been given a cane and Rollator after his falls.   Current Functional Level Cognition  Arousal/Alertness: Awake/alert Overall Cognitive Status: Impaired/Different from baseline Orientation Level: Oriented X4 Safety/Judgement: Decreased awareness of deficits General Comments: Improving but still unaware of balance deficits and R side lean with ambulation Attention: Sustained Sustained Attention: Appears intact Memory: Appears intact (require only 1 semantic cue for 4 word recall) Awareness: Appears intact Problem Solving: (basic functiona, will assess higher level) Safety/Judgment: Appears intact [employer] Scales of Cognitive Functioning: Automatic/appropriate   Extremity Assessment (includes Sensation/Coordination)  Upper Extremity Assessment: Overall WFL for tasks assessed  Lower Extremity Assessment: Defer to PT evaluation    ADLs  Overall ADL's : Needs assistance/impaired Eating/Feeding: Set up, Sitting Grooming: Wash/dry face, Wash/dry hands, Oral care, Set up,  Sitting Upper Body Bathing: Set up, Sitting Lower Body Bathing: Minimal assistance, Sit to/from stand Lower Body Bathing Details (indicate cue type and reason): min assist for balance when coming sit to stand and when doing all adls in standing. Upper Body  Dressing : Set up, Sitting Lower Body Dressing: Moderate assistance, Sit to/from stand Lower Body Dressing Details (indicate cue type and reason): Pt needs min assist to come to stand and mod assist to be able to let go of the walker to fasten pants etc.  Toilet Transfer: Minimal assistance, Ambulation, BSC Toilet Transfer Details (indicate cue type and reason): min assist for balance. Toileting- Clothing Manipulation and Hygiene: Minimal assistance, Sit to/from stand, Cueing for safety Toileting - Clothing Manipulation Details (indicate cue type and reason): min assist to maintain balance while standing to clean self. Functional mobility during ADLs: Minimal assistance, Cueing for safety, +2 for safety/equipment General ADL Comments: Pt overall doing better with adls. Pt with some decreased balance when on his feet during adls and safety issues when up. Pt with mild impulsivity when standing stepping on cords etc when walking.    Mobility  Overal bed mobility: Needs Assistance Bed Mobility: Supine to Sit Supine to sit: Min guard General bed mobility comments: MinGuard for safety    Transfers  Overall transfer level: Needs assistance Equipment used: 1 person hand held assist Transfers: Sit to/from Stand Sit to Stand: Min assist Stand pivot transfers: Min assist General transfer comment: pt mildly unsteady and needs A for balance. cues for safe technique.     Ambulation / Gait / Stairs / Wheelchair Mobility  Ambulation/Gait Ambulation/Gait assistance: Museum/gallery curator (Feet): 100 Feet Assistive device: None Gait Pattern/deviations: Step-through pattern, Shuffle, Decreased stride length, Staggering right,  Drifts right/left Gait velocity interpretation: Below normal speed for age/gender General Gait Details: Patient continues to drift right side with ambulation. Continues with decreased stride length especially with L pain due to increased pain in heel of foot. Continues to need assistance with turns    Posture / Balance Static Standing Balance Single Leg Stance - Right Leg: 5 Single Leg Stance - Left Leg: 1 Balance Overall balance assessment: Needs assistance Sitting-balance support: Feet supported Sitting balance-Leahy Scale: Good Standing balance support: Bilateral upper extremity supported, During functional activity Standing balance-Leahy Scale: Poor Standing balance comment: Patient continues to require A with dynamic standing balance and with weight shifts. UE support required for static balance Single Leg Stance - Right Leg: 5 Single Leg Stance - Left Leg: 1 High level balance activites: Direction changes, Turns, Sudden stops, Head turns High Level Balance Comments: Min to Mod A to prevent fall especially to R side. Shuffle with turns.  Standardized Balance Assessment Standardized Balance Assessment : Berg Balance Test Berg Balance Test Sit to Stand: Able to stand without using hands and stabilize independently Standing Unsupported: Able to stand safely 2 minutes Sitting with Back Unsupported but Feet Supported on Floor or Stool: Able to sit safely and securely 2 minutes Stand to Sit: Sits safely with minimal use of hands Transfers: Able to transfer safely, definite need of hands Standing Unsupported with Eyes Closed: Able to stand 3 seconds Standing Ubsupported with Feet Together: Able to place feet together independently but unable to hold for 30 seconds From Standing, Reach Forward with Outstretched Arm: Can reach forward >5 cm safely (2") From Standing Position, Pick up Object from Floor: Unable to pick up shoe, but reaches 2-5 cm (1-2") from shoe and balances  independently From Standing Position, Turn to Look Behind Over each Shoulder: Turn sideways only but maintains balance Turn 360 Degrees: Needs assistance while turning Standing Unsupported, Alternately Place Feet on Step/Stool: Needs assistance to keep from falling or unable to try Standing Unsupported, One Foot in  Front: Loses balance while stepping or standing Standing on One Leg: Unable to try or needs assist to prevent fall Total Score: 29    Special needs/care consideration BiPAP/CPAP No CPM No Continuous Drip IV 0.9% NS 50 ml/hr Dialysis No  Life Vest No Oxygen No Special Bed No Trach Size No Wound Vac (area) No  Skin: B crani stapled incisions  Bowel mgmt: Has BM 10/22/14 Bladder mgmt: Voiding in urinal Diabetic mgmt Yes, on insulin at home     Previous Home Environment Living Arrangements: Alone, Spouse/significant other, Other (Comment) (cares for his wife but now family doing this) Lives With: Spouse Type of Home: [place]: No  Discharge Living Setting Plans for Discharge Living Setting: Patient's home, House, Lives with (comment) Type of Home at Discharge: House Discharge Home Layout: One level Discharge Home Access: Stairs to enter Entrance Stairs-Number of Steps: 3 steps side entrance Does the patient have any problems obtaining your medications?: No  Social/Family/Support Systems Patient Roles: Spouse, Parent (Has a wife, 2 sons and 1 daughter.) Contact Information: Tejon Gracie - son [phone] Anticipated Caregiver: sons and daughter Anticipated Caregiver's Contact Information: see emergency contacts Ability/Limitations of Caregiver: Sons and dtr plan to share 24 hr care for parents after rehab. Caregiver Availability: Evenings only Discharge Plan Discussed with Primary Caregiver: Yes Is Caregiver In Agreement with Plan?: Yes Does Caregiver/Family have Issues with  Lodging/Transportation while Pt is in Rehab?: No  Goals/Additional Needs Patient/Family Goal for Rehab: PT/OT mod I, ST mod I/S goals Expected length of stay: 7-9 days Cultural Considerations: [facility] Dietary Needs: Dys 2, thin liquids Equipment Needs: TBD Pt/Family Agrees to Admission and willing to participate: Yes Program Orientation Provided & Reviewed with Pt/Caregiver Including Roles & Responsibilities: Yes  Decrease burden of Care through IP rehab admission: N/A  Possible need for SNF placement upon discharge: Not anticipated  Patient Condition: This patient's medical and functional status has changed since the consult dated: 10/20/14 in which the Rehabilitation Physician determined and documented that the patient's condition is appropriate for intensive rehabilitative care in an inpatient rehabilitation facility. See "History of Present Illness" (above) for medical update. Functional changes are: Currently requiring min assist for ambulation 100 ft no device. Patient's medical and functional status update has been discussed with the Rehabilitation physician and patient remains appropriate for inpatient rehabilitation. Will admit to inpatient rehab today.  Preadmission Screen Completed By: Retta Diones, 10/24/2014 9:03 AM ______________________________________________________________________  Discussed status with Dr. Letta Pate on 10/24/14 at 0902 and received telephone approval for admission today.  Admission Coordinator: Retta Diones, time0902/Date02/26/16          Cosigned by: Charlett Blake, MD at 10/24/2014 10:11 AM  Revision History     Date/Time User Provider Type Action   10/24/2014 10:11 AM Charlett Blake, MD Physician Cosign   10/24/2014 9:03 AM Retta Diones, RN Rehab Admission Coordinator Sign

## 2014-10-24 NOTE — Progress Notes (Signed)
Patient arrived from [facility] with RN, family, and belongings. Patient and family oriented to room, rehab process, fall prevention plan, rehab safety, Health Resource Notebook with verbal understanding. Patient resting comfortably in bed with family at bedside and no c/o of pain. Will continue to monitor.

## 2014-10-24 NOTE — Progress Notes (Signed)
Ranelle Oyster, MD Physician Signed Physical Medicine and Rehabilitation Consult Note 10/20/2014 9:23 AM  Related encounter: Admission (Current) from 10/16/2014 in [facility]    Expand All Collapse All        Physical Medicine and Rehabilitation Consult Reason for Consult:SDH Referring Physician: Dr.Stern   HPI: Mason Stevens is a 81 y.o. right handed male with history of hypertension, diabetes mellitus and peripheral neuropathy. Admitted 10/16/2014 with frequent falls as well as altered mental status. Initially presented to [facility]. Cranial CT scan showed large bilateral subdural hematomas. Patient was transferred to [facility] and underwent bilateral craniotomy hematoma evacuation of subdural hematoma 10/16/2014 per Dr. Venetia Maxon. Placed on Keppra for seizure prophylaxis. Plan follow cranial CT scan pending after subdural hematoma evacuation. Patient with new onset atrial fibrillation. Echocardiogram with ejection fraction of 55% no wall motion abnormalities. Troponin negative. Tolerating a regular consistency diet. Await formal physical and occupational therapy evaluations. M.D. has requested physical medicine rehabilitation consult.   Review of Systems  Gastrointestinal: Positive for constipation.  Musculoskeletal: Positive for falls.  Neurological: Positive for headaches.  All other systems reviewed and are negative.  History reviewed. No pertinent past medical history. Past Surgical History  Procedure Laterality Date  . Craniotomy Bilateral 10/16/2014    Procedure: BILATERALCRANIOTOMY HEMATOMA EVACUATION SUBDURAL; Surgeon: Maeola Harman, MD; Location: [facility]; Service: Neurosurgery; Laterality: Bilateral; BILATERALCRANIOTOMY HEMATOMA EVACUATION SUBDURAL   History reviewed. No pertinent family history. Social History:  has no tobacco, alcohol, and drug history on file. Allergies: No Active  Allergies Medications Prior to Admission  Medication Sig Dispense Refill  . amLODipine (NORVASC) 10 MG tablet Take 10 mg by mouth daily.    . hydrALAZINE (APRESOLINE) 10 MG tablet Take 20 mg by mouth 2 (two) times daily.    . insulin NPH Human (HUMULIN N,NOVOLIN N) 100 UNIT/ML injection Inject 4-8 Units into the skin daily. Take 4 units every morning Take 8 units every evening    . lisinopril-hydrochlorothiazide (PRINZIDE,ZESTORETIC) 20-12.5 MG per tablet Take 2 tablets by mouth daily.    Marland Kitchen lovastatin (MEVACOR) 20 MG tablet Take 40 mg by mouth at bedtime.    . metFORMIN (GLUCOPHAGE) 1000 MG tablet Take 1,000 mg by mouth 2 (two) times daily with a meal.      Home: Home Living Family/patient expects to be discharged to:: Private residence Living Arrangements: Alone Type of Home: House Lives With: Spouse  Functional History:   Functional Status:  Mobility:          ADL:    Cognition: Cognition Overall Cognitive Status: Impaired/Different from baseline Arousal/Alertness: Awake/alert Orientation Level: Oriented X4 Attention: Sustained Sustained Attention: Appears intact Memory: Appears intact (require only 1 semantic cue for 4 word recall) Awareness: Appears intact Problem Solving: (basic functiona, will assess higher level) Safety/Judgment: Appears intact [employer] Scales of Cognitive Functioning: Automatic/appropriate Cognition Overall Cognitive Status: Impaired/Different from baseline  Blood pressure 139/66, pulse 70, temperature 99.6 F (37.6 C), temperature source Oral, resp. rate 24, height  (1.778 m), weight 74.4 kg (164 lb 0.4 oz), SpO2 98 %. Physical Exam  Constitutional: He is oriented to person, place, and time.  Eyes: EOM are normal.  Neck: Normal range of motion. Neck supple. No thyromegaly present.  Cardiovascular:  Cardiac rate controlled  Respiratory: Effort normal and breath sounds normal. No  respiratory distress.  GI: Soft. Bowel sounds are normal. He exhibits no distension.  Neurological: He is alert and oriented  to person, place, and time.  Follows commands. Fair awareness of deficits. Alert. Moves all 4's. Sitting upright in chair.  Skin:  Craniotomy site dressed.Drain remains in place  Psychiatric:  Fairly pleasant and appropriate     Lab Results Last 24 Hours    Results for orders placed or performed during the hospital encounter of 10/16/14 (from the past 24 hour(s))  Glucose, capillary Status: Abnormal   Collection Time: 10/19/14 12:05 PM  Result Value Ref Range   Glucose-Capillary 254 (H) 70 - 99 mg/dL  Glucose, capillary Status: Abnormal   Collection Time: 10/19/14 4:54 PM  Result Value Ref Range   Glucose-Capillary 136 (H) 70 - 99 mg/dL  Glucose, capillary Status: Abnormal   Collection Time: 10/19/14 9:45 PM  Result Value Ref Range   Glucose-Capillary 202 (H) 70 - 99 mg/dL   Comment 1 Notify RN    Comment 2 Documented in Char   Glucose, capillary Status: Abnormal   Collection Time: 10/20/14 7:18 AM  Result Value Ref Range   Glucose-Capillary 158 (H) 70 - 99 mg/dL   Comment 1 Notify RN    Comment 2 Documented in Char       Imaging Results (Last 48 hours)    Ct Head Wo Contrast  10/20/2014 CLINICAL DATA: Follow-up subdural hematoma status post craniotomy. Subsequent encounter. EXAM: CT HEAD WITHOUT CONTRAST TECHNIQUE: Contiguous axial images were obtained from the base of the skull through the vertex without intravenous contrast. COMPARISON: MR head 10/16/2014. FINDINGS: BILATERAL frontal craniotomies were performed for subdural hematoma evacuation. BILATERAL Jackson-Pratt drains lie within the subdural space. Minor pneumocephalus is seen anteriorly. Considerable residual subdural fluid is present. On the RIGHT, this is relatively hypoattenuating, 15-20 HU, consistent with  residual chronic subdural hematoma and/or hygroma. On the LEFT, there is considerable residual hyperdense material notably over the parietal convexity measuring between 45 and 65 HU suggesting subacute or acute hemorrhage. Maximum thickness is 23 mm RIGHT and 19 mm LEFT. The brain is squeezed centrally with effacement of the cortical sulci. There is no midline shift or hydrocephalus. No parenchymal hemorrhage is present. 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 Scalp soft tissues reflect previous BILATERAL craniotomy but are otherwise unremarkable. No sinus or mastoid disease. IMPRESSION: Residual/recurrent BILATERAL subdural hematomata, slightly larger on the RIGHT but with more acute to subacute blood products on the LEFT. Electronically Signed By: Davonna BellingJohn Curnes M.D. On: 10/20/2014 07:07     Assessment/Plan: Diagnosis: bilateral SDH's s/p craniotomies on 2/18 1. Does the need for close, 24 hr/day medical supervision in concert with the patient's rehab needs make it unreasonable for this patient to be served in a less intensive setting? Yes 2. Co-Morbidities requiring supervision/potential complications: afib, type 2 dm 3. Due to bladder management, bowel management, safety, skin/wound care, disease management, medication administration, pain management and patient education, does the patient require 24 hr/day rehab nursing? Yes 4. Does the patient require coordinated care of a physician, rehab nurse, PT (1-2 hrs/day, 5 days/week), OT (1-2 hrs/day, 5 days/week) and SLP (1-2 hrs/day, 5 days/week) to address physical and functional deficits in the context of the above medical diagnosis(es)? Yes Addressing deficits in the following areas: balance, endurance, locomotion, strength, transferring, bowel/bladder control, bathing, dressing, feeding, grooming, toileting, cognition and psychosocial support 5. Can the patient actively participate in an intensive therapy program of at least 3 hrs of  therapy per day at least 5 days per week? Yes 6. The potential for patient to make measurable gains while on inpatient rehab is excellent  7. Anticipated functional outcomes upon discharge from inpatient rehab are modified independent with PT, modified independent with OT, modified independent and supervision with SLP. 8. Estimated rehab length of stay to reach the above functional goals is: 7-9 days 9. Does the patient have adequate social supports and living environment to accommodate these discharge functional goals? Potentially 10. Anticipated D/C setting: Home 11. Anticipated post D/C treatments: HH therapy and Outpatient therapy 12. Overall Rehab/Functional Prognosis: excellent  RECOMMENDATIONS: This patient's condition is appropriate for continued rehabilitative care in the following setting: CIR Patient has agreed to participate in recommended program. Potentially Note that insurance prior authorization may be required for reimbursement for recommended care.  Comment: Will need some supervision at least initially at home. Rehab Admissions Coordinator to follow up.  Thanks,  Ranelle Oyster, MD, [place]     10/20/2014       Revision History     Date/Time User Provider Type Action   10/20/2014 2:07 PM Ranelle Oyster, MD Physician Sign   10/20/2014 9:43 AM Charlton Amor, PA-C Physician Assistant [place]   View Details Report       Routing History     Date/Time From To Method   10/20/2014 2:07 PM Ranelle Oyster, MD Ranelle Oyster, MD In Basket

## 2014-10-25 ENCOUNTER — Inpatient Hospital Stay (HOSPITAL_COMMUNITY): Payer: Medicare HMO | Admitting: Speech Pathology

## 2014-10-25 ENCOUNTER — Inpatient Hospital Stay (HOSPITAL_COMMUNITY): Payer: Medicare HMO

## 2014-10-25 ENCOUNTER — Inpatient Hospital Stay (HOSPITAL_COMMUNITY): Payer: Medicare HMO | Admitting: Physical Therapy

## 2014-10-25 ENCOUNTER — Inpatient Hospital Stay (HOSPITAL_COMMUNITY): Payer: Medicare HMO | Admitting: Occupational Therapy

## 2014-10-25 DIAGNOSIS — G8929 Other chronic pain: Secondary | ICD-10-CM

## 2014-10-25 DIAGNOSIS — M79672 Pain in left foot: Secondary | ICD-10-CM

## 2014-10-25 LAB — GLUCOSE, CAPILLARY
GLUCOSE-CAPILLARY: 146 mg/dL — AB (ref 70–99)
GLUCOSE-CAPILLARY: 198 mg/dL — AB (ref 70–99)
Glucose-Capillary: 159 mg/dL — ABNORMAL HIGH (ref 70–99)
Glucose-Capillary: 226 mg/dL — ABNORMAL HIGH (ref 70–99)

## 2014-10-25 NOTE — Progress Notes (Signed)
Subjective Only complains of Left heel pain- ongoing for 6 months Hurts with pressure at rest or when he initially starts to walk-- he thinks pain started after a fall and has "never healed".    Objective: BP 155/71 mmHg  Pulse 60  Temp(Src) 98.6 F (37 C) (Oral)  Resp 18  Ht 5\' 10"  (1.778 m)  Wt 161 lb 9.6 oz (73.3 kg)  BMI 23.19 kg/m2  SpO2 99% Pleasant male in NAD HEENT_ healing wounds on scalp Chest- CTA CV- REg RATE Abd:- soft, NT Ext- no edema  Medical Problem List and Plan:  1. Functional deficits secondary to traumatic bilateral subdural hematoma status post craniotomies 10/16/2014 2. DVT Prophylaxis/Anticoagulation: Sequential compression devices. No anticoagulation at this time due to SDH 3. Pain Management: Hydrocodone as needed. Monitor with increased mobility 4. Seizure prophylaxis. Keppra 500 mg twice a day. Monitor for any seizure activity 5. Neuropsych: This patient is capable of making decisions on his own behalf. 6. Skin/Wound Care: Routine skin checks 7. Fluids/Electrolytes/Nutrition:  Basic Metabolic Panel:    Component Value Date/Time   NA 136 10/18/2014 0507   K 3.7 10/18/2014 0507   CL 110 10/18/2014 0507   CO2 22 10/18/2014 0507   BUN 15 10/18/2014 0507   CREATININE 0.86 10/18/2014 0507   GLUCOSE 188* 10/18/2014 0507   CALCIUM 8.0* 10/18/2014 0507   8. Dysphagia. Dysphagia 2 thin liquids. Follow-up speech therapy 9. Hypertension. 130/60-155/71 10. New onset atrial fibrillation. Troponin negative. Cardiac rate control. Plan aspirin therapy once cleared by neurosurgery 11. Diabetes mellitus  CBG (last 3)   Recent Labs  10/24/14 1110 10/24/14 1614 10/24/14 2137  GLUCAP 208* 134* 158*   12. BPH. Flomax 0.4 mg daily. Check PVR 3 13.Hyperlipedemia. Pravachol 14. Heel pain- suspect plantar fasciitis- will check xray given hx of trauma

## 2014-10-25 NOTE — Evaluation (Signed)
Speech Language Pathology Assessment and Plan  Patient Details  Name: Mason Stevens No MRN: [id number] Date of Birth: 1927-07-26  SLP Diagnosis: Cognitive Impairments  Rehab Potential: Excellent ELOS: 7-10 days    Today's Date: 10/25/2014 SLP Individual Time: 0900-1000 SLP Individual Time Calculation (min): 60 min   Problem List:  Patient Active Problem List   Diagnosis Date Noted  . SDH (subdural hematoma) 10/24/2014  . Protein-calorie malnutrition, severe 10/17/2014  . Subdural hematoma 10/16/2014  . Atrial fibrillation 10/16/2014  . Type 2 diabetes mellitus without complication   . Anemia due to other cause    Past Medical History: No past medical history on file. Past Surgical History:  Past Surgical History  Procedure Laterality Date  . Craniotomy Bilateral 10/16/2014    Procedure: BILATERALCRANIOTOMY HEMATOMA EVACUATION SUBDURAL;  Surgeon: Erline Levine, MD;  Location: [place] [facility];  Service: Neurosurgery;  Laterality: Bilateral;  BILATERALCRANIOTOMY HEMATOMA EVACUATION SUBDURAL    Assessment / Plan / Recommendation Clinical Impression Mason Stevens is a 81 y.o. right handed male with history of hypertension, diabetes mellitus and peripheral neuropathy. Admitted 10/16/2014 with frequent falls as well as altered mental status. Initially presented to [facility]. Cranial CT scan showed large bilateral subdural hematomas. Patient was transferred to [facility] and underwent bilateral craniotomy hematoma evacuation of subdural hematoma 10/16/2014 per Dr. Vertell Limber. Placed on Keppra for seizure prophylaxis. Follow-up cranial CT scan 10/21/2014 with no acute changes. Patient with new onset atrial fibrillation. Echocardiogram with ejection fraction of 55% no wall motion abnormalities. Troponin negative. Plan to begin aspirin therapy once cleared by neurosurgery. Tolerating a regular dysphagia 2 thin liquids. Physical and occupational therapy evaluations  completed M.D. has requested physical medicine rehabilitation consult. Patient would benefit from cognitive rehab to increase cognitive function and overall functional independence related to memory and problem solving.   Skilled Therapeutic Interventions          Bedside swallow evaluation complete. Oral mtr exam appears WLF. Pt noted with appropriate bolus manipulation, A-P transfer and no oral stasis with regular/thin diet. Pt reports no swallowing difficulties and self-manages compensatory swallow strategies. Speech-language evaluation complete. Exp/rec communication, attention, orientation WFL. Pt noted with mild-mod deficits in cognition, including memory and problem solving. 60% accurate with verbal problem solving tasks and 50-60% accurate with short-term recall of new information. Pt states that he was previously independent with med management, finance (cash/check) cooking, yard work, Social research officer, government.   SLP Assessment  Patient will need skilled Oceanographer during CIR admission    Recommendations  Diet Recommendations: Regular;Thin liquid Liquid Administration via: Cup;Straw Medication Administration: Whole meds with liquid Supervision: Patient able to self feed Compensations: Small sips/bites;Slow rate Postural Changes and/or Swallow Maneuvers: Seated upright 90 degrees Oral Care Recommendations: Oral care BID Patient destination: Home Follow up Recommendations: None Equipment Recommended: None recommended by SLP    SLP Frequency 3 to 5 out of 7 days   SLP Treatment/Interventions Cognitive remediation/compensation;Functional tasks;Patient/family education;Internal/external aids;Cueing hierarchy;Medication managment;Speech/Language facilitation;Multimodal communication approach    Pain Pain Assessment Pain Assessment: No/denies pain Prior Functioning Cognitive/Linguistic Baseline: Within functional limits Type of Home: House  Lives With: Spouse Available Help at  Discharge: Family;Available 24 hours/day Education: 5th grade Vocation: Retired Statistician, 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   See FIM for current functional status Refer to Care Plan for Long Term Goals  Recommendations for other services: None  Discharge Criteria: Patient will be discharged from SLP if patient refuses treatment 3 consecutive times without medical reason, if treatment goals not met, if there is a change in medical status, if patient makes no progress towards goals or if patient is discharged from hospital.  The above assessment, treatment plan, treatment alternatives and goals were discussed and mutually agreed upon: by patient  Adele Dan 10/25/2014, 5:23 PM

## 2014-10-25 NOTE — Evaluation (Signed)
Occupational Therapy Assessment and Plan  Patient Details  Name: Mason Stevens MRN: [id number] Date of Birth: 01-07-1927  OT Diagnosis: abnormal posture, muscle weakness (generalized) and pain in joint Rehab Potential: Rehab Potential (ACUTE ONLY): Excellent ELOS: 7-10 days   Today's Date: 10/25/2014 OT Individual Time: 1000-1100 OT Individual Time Calculation (min): 60 min     Problem List:  Patient Active Problem List   Diagnosis Date Noted  . SDH (subdural hematoma) 10/24/2014  . Protein-calorie malnutrition, severe 10/17/2014  . Subdural hematoma 10/16/2014  . Atrial fibrillation 10/16/2014  . Type 2 diabetes mellitus without complication   . Anemia due to other cause     Past Medical History: No past medical history on file. Past Surgical History:  Past Surgical History  Procedure Laterality Date  . Craniotomy Bilateral 10/16/2014    Procedure: BILATERALCRANIOTOMY HEMATOMA EVACUATION SUBDURAL;  Surgeon: Erline Levine, MD;  Location: [place] [facility];  Service: Neurosurgery;  Laterality: Bilateral;  BILATERALCRANIOTOMY HEMATOMA EVACUATION SUBDURAL    Assessment & Plan Clinical Impression:  Mason Stevens is a 81 y.o. right handed male with history of hypertension, diabetes mellitus and peripheral neuropathy. Admitted 10/16/2014 with frequent falls as well as altered mental status. Initially presented to [facility]. Cranial CT scan showed large bilateral subdural hematomas. Patient was transferred to [facility] and underwent bilateral craniotomy hematoma evacuation of subdural hematoma 10/16/2014 per Dr. Vertell Limber. Placed on Keppra for seizure prophylaxis. Follow-up cranial CT scan 10/21/2014 with no acute changes. Patient with new onset atrial fibrillation. Echocardiogram with ejection fraction of 55% no wall motion abnormalities. Troponin negative. Plan to begin aspirin therapy once cleared by neurosurgery. Tolerating a regular dysphagia 2 thin  liquids. Physical and occupational therapy evaluations completed M.D. has requested physical medicine rehabilitation consult. Patient was admitted for comprehensive rehabilitation program Patient transferred to CIR on 10/24/2014 .    Patient currently requires min A with ADL transfers and S with basic self-care skills secondary to muscle weakness, decreased cardiorespiratoy endurance and decreased standing balance and decreased postural control.  Prior to hospitalization, patient was fully independent and driving.  Patient will benefit from skilled intervention to increase independence with basic self-care skills prior to discharge home with care partner.  Anticipate patient will require intermittent supervision and follow up home health.  OT - End of Session Activity Tolerance: Tolerates 30+ min activity with multiple rests Endurance Deficit: Yes Endurance Deficit Description: cardiorespiratory OT Assessment Rehab Potential (ACUTE ONLY): Excellent OT Patient demonstrates impairments in the following area(s): Balance;Endurance;Pain OT Basic ADL's Functional Problem(s): Bathing;Dressing;Toileting OT Advanced ADL's Functional Problem(s): Light Housekeeping;Simple Meal Preparation OT Transfers Functional Problem(s): Toilet;Tub/Shower OT Additional Impairment(s): None OT Plan OT Intensity: Minimum of 1-2 x/day, 45 to 90 minutes OT Frequency: 5 out of 7 days OT Duration/Estimated Length of Stay: 7-10 days OT Treatment/Interventions: Balance/vestibular training;Discharge planning;DME/adaptive equipment instruction;Functional mobility training;Patient/family education;Psychosocial support;Self Care/advanced ADL retraining;Therapeutic Activities;Therapeutic Exercise OT Self Feeding Anticipated Outcome(s): I OT Basic Self-Care Anticipated Outcome(s): mod I OT Toileting Anticipated Outcome(s): mod I OT Bathroom Transfers Anticipated Outcome(s): mod I to toilet, S to shower OT Recommendation Patient  destination: Home Follow Up Recommendations: Home health OT Equipment Recommended: None recommended by OT Equipment Details: Pt has a shower chair.   Skilled Therapeutic Intervention   Pt seen initial evaluation and for BADL retraining of toileting, bathing, and dressing with a focus on activity tolerance and functional mobility with RW. Pt participated extremely well, needed only 2 short rest breaks. He has good AROM and flexibility  to allow him to complete his self care with S. He was requiring min to steady A with RW transfers and this was impacted by his painful L foot and his trying to avoid putting much weight on his foot. Pt has functional UE strength and coordination. He opted to get back in bed at end of session. Reviewed OT POC and pt's goals. Reviewed safety precautions to have A with transfers. Bed alarm set and call light in reach.  OT Evaluation Precautions/Restrictions  Precautions Precautions: Fall Restrictions Weight Bearing Restrictions: No   Vital Signs Therapy Vitals Temp: 97.9 F (36.6 C) Temp Source: Oral Pulse Rate: 91 Resp: 16 BP: (!) 115/58 mmHg Patient Position (if appropriate): Lying Oxygen Therapy SpO2: 98 % O2 Device: Not Delivered Pain  c/o pain in L foot (pt will receive xray today) Home Living/Prior Functioning Home Living Available Help at Discharge: Family, Available 24 hours/day Type of Home: House Home Access: Stairs to enter Technical brewer of Steps: 3 Entrance Stairs-Rails: Right Home Layout: One level  Lives With: Spouse Prior Function Level of Independence: Independent with gait, Independent with transfers, Independent with basic ADLs, Independent with homemaking with ambulation  Able to Take Stairs?: Yes Driving: Yes Vocation: Retired Comments: pt indicates recently had been given a cane and Rollator after his falls.   ADL ADL ADL Comments: Refer to FIM Vision/Perception  Vision- History Baseline Vision/History: Wears  glasses;Cataracts Wears Glasses: Reading only Patient Visual Report: No change from baseline Vision- Assessment Vision Assessment?: No apparent visual deficits Perception Comments: WFL  Cognition Overall Cognitive Status: Within Functional Limits for tasks assessed Sensation Sensation Light Touch: Impaired Detail Light Touch Impaired Details: Impaired LLE Stereognosis: Appears Intact Hot/Cold: Appears Intact Proprioception: Appears Intact Additional Comments: chronic numbness radiating down L lateral leg Coordination Gross Motor Movements are Fluid and Coordinated: Yes Fine Motor Movements are Fluid and Coordinated: Yes Finger Nose Finger Test: wfl B UEs Heel Shin Test: wfl B LEs Motor  Motor Motor: Within Functional Limits Mobility    min A with basic transfers with RW Trunk/Postural Assessment  Cervical Assessment Cervical Assessment: Exceptions to [facility] Cervical AROM Overall Cervical AROM: Deficits;Due to premorid status Overall Cervical AROM Comments: limited cervical extension & retraction from chronic forward head posture Thoracic Assessment Thoracic Assessment: Exceptions to [facility] Thoracic AROM Overall Thoracic AROM: Deficits;Due to premorid status Overall Thoracic AROM Comments: limited trunk rotation: R side < L Lumbar Assessment Lumbar Assessment: Exceptions to [facility] Lumbar AROM Overall Lumbar AROM: Deficits;Due to premorid status Overall Lumbar AROM Comments: limited spine extension - able to obtain neutral posture when instructed to arch Postural Control Postural Control: Within Functional Limits  Balance Static Sitting Balance Static Sitting - Level of Assistance: 6: Modified independent (Device/Increase time) Dynamic Sitting Balance Dynamic Sitting - Level of Assistance: 5: Stand by assistance Static Standing Balance Static Standing - Level of Assistance: 5: Stand by assistance Dynamic Standing Balance Dynamic Standing - Level of Assistance: 4: Min  assist Extremity/Trunk Assessment RUE Assessment RUE Assessment: Within Functional Limits LUE Assessment LUE Assessment: Within Functional Limits  FIM:  FIM - Eating Eating Activity: 6: More than reasonable amount of time FIM - Grooming Grooming Steps: Wash, rinse, dry face;Wash, rinse, dry hands;Oral care, brush teeth, clean dentures Grooming: 5: Supervision: safety issues or verbal cues (standing at sink) FIM - Bathing Bathing Steps Patient Completed: Chest;Right Arm;Left Arm;Abdomen;Left upper leg;Right upper leg;Buttocks;Front perineal area;Right lower leg (including foot);Left lower leg (including foot) Bathing: 5: Supervision: Safety issues/verbal cues FIM - Upper  Body Dressing/Undressing Upper body dressing/undressing steps patient completed: Thread/unthread right sleeve of pullover shirt/dresss;Thread/unthread left sleeve of pullover shirt/dress;Put head through opening of pull over shirt/dress;Pull shirt over trunk Upper body dressing/undressing: 5: Set-up assist to: Obtain clothing/put away FIM - Lower Body Dressing/Undressing Lower body dressing/undressing steps patient completed: Thread/unthread right underwear leg;Thread/unthread left underwear leg;Pull underwear up/down;Thread/unthread right pants leg;Thread/unthread left pants leg;Pull pants up/down;Don/Doff right sock;Don/Doff left sock Lower body dressing/undressing: 5: Supervision: Safety issues/verbal cues FIM - Toileting Toileting steps completed by patient: Adjust clothing prior to toileting;Performs perineal hygiene;Adjust clothing after toileting Toileting: 5: Supervision: Safety issues/verbal cues FIM - Bed/Chair Transfer Bed/Chair Transfer Assistive Devices: Adult nurse Transfer: 5: Sit > Supine: Supervision (verbal cues/safety issues);4: Chair or W/C > Bed: Min A (steadying Pt. > 75%) FIM - Radio producer Devices: Mining engineer Transfers: 4-To toilet/BSC: Min A  (steadying Pt. > 75%);4-From toilet/BSC: Min A (steadying Pt. > 75%) FIM - Systems developer Devices: Shower chair;Walk in shower;Walker;Grab bars Tub/shower Transfers: 4-Into Tub/Shower: Min A (steadying Pt. > 75%/lift 1 leg);4-Out of Tub/Shower: Min A (steadying Pt. > 75%/lift 1 leg)   Refer to Care Plan for Long Term Goals  Recommendations for other services: None  Discharge Criteria: Patient will be discharged from OT if patient refuses treatment 3 consecutive times without medical reason, if treatment goals not met, if there is a change in medical status, if patient makes no progress towards goals or if patient is discharged from hospital.  The above assessment, treatment plan, treatment alternatives and goals were discussed and mutually agreed upon: by patient  [facility] 10/25/2014, 3:45 PM

## 2014-10-25 NOTE — Evaluation (Signed)
Physical Therapy Assessment and Plan  Patient Details  Name: Mason Stevens MRN: [id number] Date of Birth: 01/27/1927  PT Diagnosis: Abnormal posture, Difficulty walking, Edema, Impaired cognition, Impaired sensation, Muscle weakness and Pain in L leg Rehab Potential: Good ELOS: 7-10 days   Today's Date: 10/25/2014 PT Individual Time: 800-900 Therapy Time: 60 minutes    Problem List:  Patient Active Problem List   Diagnosis Date Noted  . SDH (subdural hematoma) 10/24/2014  . Protein-calorie malnutrition, severe 10/17/2014  . Subdural hematoma 10/16/2014  . Atrial fibrillation 10/16/2014  . Type 2 diabetes mellitus without complication   . Anemia due to other cause     Past Medical History: No past medical history on file. Past Surgical History:  Past Surgical History  Procedure Laterality Date  . Craniotomy Bilateral 10/16/2014    Procedure: BILATERALCRANIOTOMY HEMATOMA EVACUATION SUBDURAL;  Surgeon: Erline Levine, MD;  Location: [place] [facility];  Service: Neurosurgery;  Laterality: Bilateral;  BILATERALCRANIOTOMY HEMATOMA EVACUATION SUBDURAL    Assessment & Plan Clinical Impression: Mason Stevens is a 81 y.o. right handed male with history of hypertension, diabetes mellitus and peripheral neuropathy. Admitted 10/16/2014 with frequent falls as well as altered mental status. Initially presented to [facility]. Cranial CT scan showed large bilateral subdural hematomas. Patient was transferred to [facility] and underwent bilateral craniotomy hematoma evacuation of subdural hematoma 10/16/2014 per Dr. Vertell Limber. Placed on Keppra for seizure prophylaxis. Follow-up cranial CT scan 10/21/2014 with no acute changes. Patient with new onset atrial fibrillation. Echocardiogram with ejection fraction of 55% no wall motion abnormalities. Troponin negative. Plan to begin aspirin therapy once cleared by neurosurgery. Tolerating a regular dysphagia 2 thin liquids.  Physical and occupational therapy evaluations completed M.D. has requested physical medicine rehabilitation consult.  Patient transferred to [facility] on 10/24/2014 .   Patient currently requires min with mobility secondary to muscle weakness, decreased cardiorespiratoy endurance, decreased coordination, decreased memory and decreased standing balance.  Prior to hospitalization, patient was independent  with mobility but frequently falling and lived with Spouse in a House home.  Home access is 3Stairs to enter.  Patient will benefit from skilled PT intervention to maximize safe functional mobility, minimize fall risk and decrease caregiver burden for planned discharge home with 24 hour assist.  Anticipate patient will benefit from follow up OP at discharge.     Skilled Therapeutic Intervention PT Evaluation: PT initiates evaluation and finds pt to be grossly min A with all functional mobility. The only cognitive deficit PT notes is related to memory - chart says pt has had frequent falls, but pt denies this. Pt's primary limitation appears to be his L leg pain x 6 months, resulting from pt catching his wife after she lost her balance. PT notes radiating pain from hip to foot with numbness in lateral aspect of leg and suspects pain generator of lumbar spine. This is to be examined further during future PT sessions. Pt also demonstrates a balance impairment, which is ameliorated with use of RW. Pt will benefit from IPR PT to maximize functional independence.   Gait Training: PT instructs pt in ambulation with RW x 160' req min A for safety and verbal cues for large R step length, upright posture, and to stand inside of the walker.  PT instructs pt in ascending/descending 15 stairs with B rails req min A in step-over-step and occasional step-to pattern with fatigue.   Neuromuscular Reeducation: PT administers TUG and pt scores 24 seconds - using RW - indicating falls  risk.   Continue per PT POC.    PT  Evaluation Precautions/Restrictions Precautions Precautions: Fall Restrictions Weight Bearing Restrictions: No General Chart Reviewed: Yes Family/Caregiver Present: No Vital SignsTherapy Vitals Temp: 98.6 F (37 C) Temp Source: Oral Pulse Rate: (!) 55 Resp: 18 BP: (!) 147/72 mmHg Patient Position (if appropriate): Sitting Oxygen Therapy SpO2: 100 % O2 Device: Not Delivered Pain Pain Assessment Pain Assessment: 0-10 Pain Score: 4  Pain Type: Chronic pain Pain Location: Foot Pain Orientation: Left Pain Descriptors / Indicators: Sharp Pain Onset: Gradual Pain Intervention(s): RN made aware Multiple Pain Sites: Yes 2nd Pain Site Pain Score: 1 Pain Type: Acute pain Pain Location: Head Pain Descriptors / Indicators: Aching Pain Onset: On-going Pain Intervention(s): RN made aware Home Living/Prior Functioning Home Living Available Help at Discharge: Family;Available 24 hours/day Type of Home: House Home Access: Stairs to enter [employer] of Steps: 3 Entrance Stairs-Rails: Right Home Layout: One level  Lives With: Spouse Prior Function Level of Independence: Independent with gait;Independent with transfers  Able to Take Stairs?: Yes Driving: Yes Vocation: Retired Comments: pt indicates recently had been given a cane and Rollator after his falls.   Cognition Arousal/Alertness: Awake/alert Orientation Level: Oriented X4 Attention: Focused;Sustained Focused Attention: Appears intact Sustained Attention: Appears intact Memory: Impaired Memory Impairment: Decreased recall of new information Safety/Judgment: Appears intact Sensation Sensation Light Touch: Impaired Detail Light Touch Impaired Details: Impaired LLE Stereognosis: Not tested Hot/Cold: Not tested Proprioception: Appears Intact Additional Comments: chronic numbness radiating down L lateral leg Coordination Gross Motor Movements are Fluid and Coordinated: Yes Fine Motor Movements are  Fluid and Coordinated: Not tested Finger Nose Finger Test: wfl B UEs Heel Shin Test: wfl B UEs Motor  Motor Motor: Within Functional Limits  Mobility Bed Mobility Bed Mobility: Supine to Sit;Sit to Supine Supine to Sit: 5: Supervision Supine to Sit Details: Verbal cues for precautions/safety Sit to Supine: 5: Supervision Sit to Supine - Details: Verbal cues for precautions/safety Transfers Transfers: Yes Stand Pivot Transfers: 4: Min assist Stand Pivot Transfer Details: Manual facilitation for placement;Verbal cues for precautions/safety Stand Pivot Transfer Details (indicate cue type and reason): turn all the way around before sitting Locomotion  Ambulation Ambulation: Yes Ambulation/Gait Assistance: 4: Min assist Ambulation Distance (Feet): 160 Feet Assistive device: Rolling walker Ambulation/Gait Assistance Details: Manual facilitation for placement;Verbal cues for precautions/safety;Verbal cues for technique Ambulation/Gait Assistance Details: antalgic limp off of L LE Gait Gait: Yes Gait Pattern: Impaired Gait Pattern: Step-to pattern;Antalgic;Trunk flexed Stairs / Additional Locomotion Stairs: Yes Stairs Assistance: 4: Min assist Stairs Assistance Details: Manual facilitation for placement;Verbal cues for technique Stair Management Technique: Two rails Number of Stairs: 15 Height of Stairs: 6 Ramp: 4: Min Administrator Mobility: No  Trunk/Postural Assessment  Cervical Assessment Cervical Assessment: Exceptions to [facility] Cervical AROM Overall Cervical AROM: Deficits;Due to premorid status Overall Cervical AROM Comments: limited cervical extension & retraction from chronic forward head posture Thoracic Assessment Thoracic Assessment: Exceptions to [facility] Thoracic AROM Overall Thoracic AROM: Deficits;Due to premorid status Overall Thoracic AROM Comments: limited trunk rotation: R side < L Lumbar Assessment Lumbar Assessment: Exceptions to  [facility] Lumbar AROM Overall Lumbar AROM: Deficits;Due to premorid status Overall Lumbar AROM Comments: limited spine extension - able to obtain neutral posture when instructed to arch Postural Control Postural Control: Within Functional Limits  Balance Balance Balance Assessed: Yes Standardized Balance Assessment Standardized Balance Assessment: Timed Up and Go Test Timed Up and Go Test TUG: Normal TUG Normal TUG (seconds): 24 Static Sitting  Balance Static Sitting - Balance Support: Left upper extremity supported;Right upper extremity supported;Feet supported Static Sitting - Level of Assistance: 6: Modified independent (Device/Increase time) Dynamic Sitting Balance Dynamic Sitting - Balance Support: Right upper extremity supported;Left upper extremity supported;Feet supported;During functional activity Dynamic Sitting - Level of Assistance: 5: Stand by assistance Static Standing Balance Static Standing - Balance Support: During functional activity;No upper extremity supported Static Standing - Level of Assistance: 4: Min assist Dynamic Standing Balance Dynamic Standing - Balance Support: No upper extremity supported;During functional activity Dynamic Standing - Level of Assistance: 4: Min assist Dynamic Standing - Balance Activities: Lateral lean/weight shifting;Forward lean/weight shifting Extremity Assessment  RUE Assessment RUE Assessment: Within Functional Limits LUE Assessment LUE Assessment: Within Functional Limits RLE Assessment RLE Assessment: Within Functional Limits LLE Assessment LLE Assessment: Exceptions to [facility] LLE Strength LLE Overall Strength: Deficits;Due to pain LLE Overall Strength Comments: grossly 4+/5 throughout - limited by chronic hip radiating to foot pain x 6 months  FIM:      Refer to Care Plan for Long Term Goals  Recommendations for other services: None  Discharge Criteria: Patient will be discharged from PT if patient refuses treatment 3  consecutive times without medical reason, if treatment goals not met, if there is a change in medical status, if patient makes no progress towards goals or if patient is discharged from hospital.  The above assessment, treatment plan, treatment alternatives and goals were discussed and mutually agreed upon: by patient  [facility] M 10/25/2014, 8:36 AM

## 2014-10-26 ENCOUNTER — Inpatient Hospital Stay (HOSPITAL_COMMUNITY): Payer: Medicare HMO | Admitting: *Deleted

## 2014-10-26 LAB — URINE MICROSCOPIC-ADD ON

## 2014-10-26 LAB — URINALYSIS, ROUTINE W REFLEX MICROSCOPIC
BILIRUBIN URINE: NEGATIVE
Glucose, UA: 250 mg/dL — AB
Hgb urine dipstick: NEGATIVE
KETONES UR: NEGATIVE mg/dL
NITRITE: NEGATIVE
Protein, ur: NEGATIVE mg/dL
Specific Gravity, Urine: 1.012 (ref 1.005–1.030)
Urobilinogen, UA: 0.2 mg/dL (ref 0.0–1.0)
pH: 5.5 (ref 5.0–8.0)

## 2014-10-26 LAB — GLUCOSE, CAPILLARY
Glucose-Capillary: 182 mg/dL — ABNORMAL HIGH (ref 70–99)
Glucose-Capillary: 131 mg/dL — ABNORMAL HIGH (ref 70–99)
Glucose-Capillary: 240 mg/dL — ABNORMAL HIGH (ref 70–99)
Glucose-Capillary: 200 mg/dL — ABNORMAL HIGH (ref 70–99)

## 2014-10-26 NOTE — Progress Notes (Signed)
Subjective Still with left heel pain He also notes dysuria for past 48 hours. He remembers i/o cath sometime during hospitalization with pain at the the time and hematuria.   Objective: BP 133/62 mmHg  Pulse 69  Temp(Src) 98.8 F (37.1 C) (Oral)  Resp 16  Ht 5\' 10"  (1.778 m)  Wt 161 lb 9.6 oz (73.3 kg)  BMI 23.19 kg/m2  SpO2 99% Pleasant male in NAD HEENT_ healing wounds on scalp Chest- CTA CV- IRREg RATE Abd:- soft, NT Ext- no edema  Medical Problem List and Plan:  1. Functional deficits secondary to traumatic bilateral subdural hematoma status post craniotomies 10/16/2014 2. DVT Prophylaxis/Anticoagulation: Sequential compression devices. No anticoagulation at this time due to SDH 3. Pain Management: he denies pain 4. Seizure prophylaxis. Keppra 500 mg twice a day. Monitor for any seizure activity 5. Neuropsych: This patient is capable of making decisions on his own behalf. 6. Skin/Wound Care: Routine skin checks 7. Fluids/Electrolytes/Nutrition:   8. Dysphagia. Dysphagia 2 thin liquids. Follow-up speech therapy 9. Hypertension- adequate control. 115/58-147/72 10. New onset atrial fibrillation. Troponin negative. Cardiac rate control. Plan aspirin therapy once cleared by neurosurgery 11. Diabetes mellitus  CBG (last 3)   Recent Labs  10/25/14 1643 10/25/14 2133 10/26/14 0659  GLUCAP 146* 198* 182*   12. BPH. Flomax 0.4 mg daily. - he notes some dysuria.. Will check UA 13.Hyperlipedemia. Pravachol 14. Heel pain- suspect plantar fasciitis- will check xray given hx of trauma- reviewed xray- no significant abnormalities

## 2014-10-26 NOTE — Progress Notes (Signed)
Physical Therapy Session Note  Patient Details  Name: Mason Stevens MRN: [id number] Date of Birth: October 28, 1926  Today's Date: 10/26/2014 PT Individual Time: 0900-1000 PT Individual Time Calculation (min): 60 min   Short Term Goals: Week 1:  PT Short Term Goal 1 (Week 1): STGs = LTGs due to ELOS  Skilled Therapeutic Interventions/Progress Updates:    Patient received sitting in wheelchair. Session focused on functional transfers, gait training, and balance assessment through BERG Balance Test and Five Time Sit to Stand (see details below). Patient with reports of needing to use bathroom, functional ambulation ~15' x2 in/out of bathroom with RW and minA, antalgic gait pattern noted due to decreased weight bearing through painful L foot. Wheelchair mobility >150' for activity tolerance with supervision/verbal cues for sequencing and technique. Heat applied to L foot secondary to patient reporting he does not have as much pain with L foot at home because he "never lets it get cold".  Gait training x87' with RW and minA-min guard, decreased antalgic gait pattern noted, verbal cues for upright posture and looking ahead. Patient returned to room and left sitting on toilet with instruction to use call bell when done, RN notified. Discussion with RN about ordering heat pad for patient to use in room to alleviate L foot pain.  Five times Sit to Stand Test (FTSS) Method: Use a straight back chair with a solid seat that is 16-18" high. Ask participant to sit on the chair with arms folded across their chest.   Instructions: "Stand up and sit down as quickly as possible 5 times, keeping your arms folded across your chest."   Measurement: Stop timing when the participant stands the 5th time.  TIME: _11.38_____ (in seconds)  Times > 13.6 seconds is associated with increased disability and morbidity (Guralnik, 2000) Times > 15 seconds is predictive of recurrent falls in healthy individuals aged 45 and  older (Buatois, et al., 2008) Normal performance values in community dwelling individuals aged 45 and older (Bohannon, 2006): o 60-69 years: 11.4 seconds o 70-79 years: 12.6 seconds o 80-89 years: 14.8 seconds  MCID: ? 2.3 seconds for Vestibular Disorders Wray Kearns, 2006)  Therapy Documentation Precautions:  Precautions Precautions: Fall Restrictions Weight Bearing Restrictions: No Pain: Pain Assessment Pain Assessment: 0-10 Pain Score: 2  Pain Type: Acute pain Pain Location: Heel Pain Orientation: Left Pain Descriptors / Indicators: Aching;Sore Pain Onset: Gradual Pain Intervention(s): RN made aware;Repositioned;Ambulation/increased activity;Heat applied Multiple Pain Sites: No Locomotion : Ambulation Ambulation/Gait Assistance: 4: Min assist;4: Min Government social research officer Distance: 150  Balance: Balance Balance Assessed: Yes Standardized Balance Assessment Standardized Balance Assessment: Berg Balance Test Berg Balance Test Sit to Stand: Able to stand without using hands and stabilize independently Standing Unsupported: Able to stand safely 2 minutes Sitting with Back Unsupported but Feet Supported on Floor or Stool: Able to sit safely and securely 2 minutes Stand to Sit: Sits safely with minimal use of hands Transfers: Able to transfer safely, definite need of hands Standing Unsupported with Eyes Closed: Able to stand 10 seconds with supervision Standing Ubsupported with Feet Together: Needs help to attain position but able to stand for 30 seconds with feet together (due to L foot pain) From Standing, Reach Forward with Outstretched Arm: Can reach confidently >25 cm (10") From Standing Position, Pick up Object from Floor: Able to pick up shoe, needs supervision From Standing Position, Turn to Look Behind Over each Shoulder: Looks behind one side only/other side shows less weight shift Turn 360 Degrees: Needs assistance while  turning (due to L foot pain) Standing  Unsupported, Alternately Place Feet on Step/Stool: Needs assistance to keep from falling or unable to try Standing Unsupported, One Foot in Front: Needs help to step but can hold 15 seconds Standing on One Leg: Able to lift leg independently and hold equal to or more than 3 seconds Total Score: 36/56, indicating patient is at high risk (~100%) for falls.  See FIM for current functional status  Therapy/Group: Individual Therapy  Chipper HerbBridget S Athelene Hursey S. Tacey Dimaggio, PT, DPT 10/26/2014, 10:04 AM

## 2014-10-27 ENCOUNTER — Inpatient Hospital Stay (HOSPITAL_COMMUNITY): Payer: Medicare HMO | Admitting: Occupational Therapy

## 2014-10-27 ENCOUNTER — Inpatient Hospital Stay (HOSPITAL_COMMUNITY): Payer: Medicare HMO | Admitting: Physical Therapy

## 2014-10-27 ENCOUNTER — Inpatient Hospital Stay (HOSPITAL_COMMUNITY): Payer: Medicare HMO | Admitting: Speech Pathology

## 2014-10-27 DIAGNOSIS — E1310 Other specified diabetes mellitus with ketoacidosis without coma: Secondary | ICD-10-CM

## 2014-10-27 DIAGNOSIS — S065X4S Traumatic subdural hemorrhage with loss of consciousness of 6 hours to 24 hours, sequela: Secondary | ICD-10-CM

## 2014-10-27 LAB — COMPREHENSIVE METABOLIC PANEL
ALBUMIN: 3 g/dL — AB (ref 3.5–5.2)
ALT: 10 U/L (ref 0–53)
ANION GAP: 8 (ref 5–15)
AST: 18 U/L (ref 0–37)
Alkaline Phosphatase: 71 U/L (ref 39–117)
BUN: 15 mg/dL (ref 6–23)
CO2: 28 mmol/L (ref 19–32)
CREATININE: 1.06 mg/dL (ref 0.50–1.35)
Calcium: 8.9 mg/dL (ref 8.4–10.5)
Chloride: 97 mmol/L (ref 96–112)
GFR calc Af Amer: 72 mL/min — ABNORMAL LOW (ref >90)
GFR, EST NON AFRICAN AMERICAN: 62 mL/min — AB (ref >90)
Glucose, Bld: 260 mg/dL — ABNORMAL HIGH (ref 70–99)
POTASSIUM: 3.4 mmol/L — AB (ref 3.5–5.1)
SODIUM: 133 mmol/L — AB (ref 135–145)
Total Bilirubin: 0.6 mg/dL (ref 0.3–1.2)
Total Protein: 6.7 g/dL (ref 6.0–8.3)

## 2014-10-27 LAB — GLUCOSE, CAPILLARY
GLUCOSE-CAPILLARY: 146 mg/dL — AB (ref 70–99)
GLUCOSE-CAPILLARY: 119 mg/dL — AB (ref 70–99)
Glucose-Capillary: 160 mg/dL — ABNORMAL HIGH (ref 70–99)

## 2014-10-27 LAB — CBC WITH DIFFERENTIAL/PLATELET
BASOS ABS: 0 10*3/uL (ref 0.0–0.1)
Basophils Relative: 1 % (ref 0–1)
EOS PCT: 6 % — AB (ref 0–5)
Eosinophils Absolute: 0.4 10*3/uL (ref 0.0–0.7)
HCT: 40.2 % (ref 39.0–52.0)
Hemoglobin: 13.6 g/dL (ref 13.0–17.0)
Lymphocytes Relative: 24 % (ref 12–46)
Lymphs Abs: 1.5 10*3/uL (ref 0.7–4.0)
MCH: 29.4 pg (ref 26.0–34.0)
MCHC: 33.8 g/dL (ref 30.0–36.0)
MCV: 87 fL (ref 78.0–100.0)
MONOS PCT: 10 % (ref 3–12)
Monocytes Absolute: 0.6 10*3/uL (ref 0.1–1.0)
Neutro Abs: 3.7 10*3/uL (ref 1.7–7.7)
Neutrophils Relative %: 59 % (ref 43–77)
PLATELETS: 322 10*3/uL (ref 150–400)
RBC: 4.62 MIL/uL (ref 4.22–5.81)
RDW: 13.8 % (ref 11.5–15.5)
WBC: 6.3 10*3/uL (ref 4.0–10.5)

## 2014-10-27 LAB — URINE CULTURE: Colony Count: 10000

## 2014-10-27 MED ORDER — POTASSIUM CHLORIDE CRYS ER 20 MEQ PO TBCR
20.0000 meq | EXTENDED_RELEASE_TABLET | Freq: Every day | ORAL | Status: AC
  Administered 2014-10-27 – 2014-10-28 (×2): 20 meq via ORAL
  Filled 2014-10-27: qty 1

## 2014-10-27 NOTE — Progress Notes (Signed)
[place] PHYSICAL MEDICINE & REHABILITATION     PROGRESS NOTE    Subjective/Complaints: Frequent urination, has odor. Denies new pain. Making progress with therapies  Objective: Vital Signs: Blood pressure 138/67, pulse 69, temperature 98.3 F (36.8 C), temperature source Oral, resp. rate 16, height 5\' 10"  (1.778 m), weight 73.3 kg (161 lb 9.6 oz), SpO2 99 %. Dg Foot Complete Left  10/25/2014   CLINICAL DATA:  Heel pain for 6 months, history of remote injury, initial encounter  EXAM: LEFT FOOT - COMPLETE 3+ VIEW  COMPARISON:  None.  FINDINGS: Small calcaneal spurs are noted. No acute fracture or dislocation is seen. No gross soft tissue abnormality is noted.  IMPRESSION: Small calcaneal spurs.  No acute abnormality noted.   Electronically Signed   By: Alcide CleverMark  Lukens M.D.   On: 10/25/2014 12:16   No results for input(s): WBC, HGB, HCT, PLT in the last 72 hours. No results for input(s): NA, K, CL, GLUCOSE, BUN, CREATININE, CALCIUM in the last 72 hours.  Invalid input(s): CO CBG (last 3)   Recent Labs  10/26/14 1202 10/26/14 1707 10/26/14 2046  GLUCAP 131* 240* 200*    Wt Readings from Last 3 Encounters:  10/24/14 73.3 kg (161 lb 9.6 oz)  10/20/14 74.4 kg (164 lb 0.4 oz)    Physical Exam:  Constitutional: He is oriented to person, place, and time. urine with odor Eyes: EOM are normal.  Neck: Normal range of motion. Neck supple. No thyromegaly present.  Cardiovascular:  Cardiac rate controlled  Respiratory: Effort normal and breath sounds normal. No respiratory distress.  GI: Soft. Bowel sounds are normal. He exhibits no distension.  Neurological: He is alert and oriented to person, place, and time.  Follows commands. Fair awareness of deficits. Alert. Moves all 4's. Sitting upright in chair.  Skin:  Craniotomy site dressed. Psychiatric:  Fairly pleasant and appropriate  Motor strength 4/5 in BUE and BLE  Assessment/Plan: 1. Functional deficits secondary to  traumatic bilateral subdural hematomas which require 3+ hours per day of interdisciplinary therapy in a comprehensive inpatient rehab setting. Physiatrist is providing close team supervision and 24 hour management of active medical problems listed below. Physiatrist and rehab team continue to assess barriers to discharge/monitor patient progress toward functional and medical goals. FIM: FIM - Bathing Bathing Steps Patient Completed: Chest, Right Arm, Left Arm, Abdomen, Left upper leg, Right upper leg, Buttocks, Front perineal area, Right lower leg (including foot), Left lower leg (including foot) Bathing: 5: Supervision: Safety issues/verbal cues  FIM - Upper Body Dressing/Undressing Upper body dressing/undressing steps patient completed: Thread/unthread right sleeve of pullover shirt/dresss, Thread/unthread left sleeve of pullover shirt/dress, Put head through opening of pull over shirt/dress, Pull shirt over trunk Upper body dressing/undressing: 5: Set-up assist to: Obtain clothing/put away FIM - Lower Body Dressing/Undressing Lower body dressing/undressing steps patient completed: Thread/unthread right underwear leg, Thread/unthread left underwear leg, Pull underwear up/down, Thread/unthread right pants leg, Thread/unthread left pants leg, Pull pants up/down, Don/Doff right sock, Don/Doff left sock Lower body dressing/undressing: 5: Supervision: Safety issues/verbal cues  FIM - Toileting Toileting steps completed by patient: Adjust clothing prior to toileting, Performs perineal hygiene, Adjust clothing after toileting Toileting Assistive Devices: Grab bar or rail for support Toileting: 4: Steadying assist  FIM - Diplomatic Services operational officerToilet Transfers Toilet Transfers Assistive Devices: PsychiatristBedside commode Toilet Transfers: 4-To toilet/BSC: Min A (steadying Pt. > 75%), 4-From toilet/BSC: Min A (steadying Pt. > 75%)  FIM - Bed/Chair Transfer Bed/Chair Transfer Assistive Devices: Walker, Arm rests Bed/Chair  Transfer: 4: Bed > Chair or W/C: Min A (steadying Pt. > 75%), 4: Chair or W/C > Bed: Min A (steadying Pt. > 75%)  FIM - Locomotion: Wheelchair Distance: 150 Locomotion: Wheelchair: 5: Travels 150 ft or more: maneuvers on rugs and over door sills with supervision, cueing or coaxing FIM - Locomotion: Ambulation Locomotion: Ambulation Assistive Devices: Designer, industrial/product Ambulation/Gait Assistance: 4: Min assist, 4: Min guard Locomotion: Ambulation: 2: Travels 50 - 149 ft with minimal assistance (Pt.>75%)  Comprehension Comprehension Mode: Auditory Comprehension: 6-Follows complex conversation/direction: With extra time/assistive device  Expression Expression Mode: Verbal Expression: 6-Expresses complex ideas: With extra time/assistive device  Social Interaction Social Interaction: 6-Interacts appropriately with others with medication or extra time (anti-anxiety, antidepressant).  Problem Solving Problem Solving Mode: Not assessed Problem Solving: 6-Solves complex problems: With extra time  Memory Memory Mode: Not assessed Memory: 5-Recognizes or recalls 90% of the time/requires cueing < 10% of the time  Medical Problem List and Plan:  1. Functional deficits secondary to traumatic bilateral subdural hematoma status post craniotomies 10/16/2014 2. DVT Prophylaxis/Anticoagulation: Sequential compression devices. No anticoagulation at this time due to SDH 3. Pain Management: Hydrocodone as needed. Monitor with increased mobility 4. Seizure prophylaxis. Keppra 500 mg twice a day. Monitor for any seizure activity 5. Neuropsych: This patient is capable of making decisions on his own behalf. 6. Skin/Wound Care: Routine skin checks 7. Fluids/Electrolytes/Nutrition: Strict I&O will follow chemistries 8. Dysphagia. Dysphagia 2 thin liquids.  9. Hypertension. Norvasc 10 mg daily, hydralazine 20 mg twice a day, lisinopril 40 mg daily and hydrochlorothiazide 25 mg daily. Monitor with increased  mobility 10. New onset atrial fibrillation. Troponin negative. Cardiac rate control. Plan aspirin therapy once cleared by neurosurgery 11. Diabetes mellitus with peripheral neuropathy.Glucophage 1000 mg twice a day.    -on NPH insulin at home 4am, 8pm---begin 4 bid 12. BPH. Flomax 0.4 mg daily. Check PVR's as possbile  -ua equivocal and culture pending 13.Hyperlipedemia. Pravachol  LOS (Days) 3 A FACE TO FACE EVALUATION WAS PERFORMED  Madylin Fairbank T 10/27/2014 7:55 AM

## 2014-10-27 NOTE — Progress Notes (Signed)
Speech Language Pathology Daily Session Note  Patient Details  Name: Mason Stevens Keitt MRN: [id number] Date of Birth: 28-Nov-1926  Today's Date: 10/27/2014 SLP Individual Time: 1300-1400 SLP Individual Time Calculation (min): 60 min  Short Term Goals: Week 1: SLP Short Term Goal 1 (Week 1): Pt will utilize call bell appropriately to make needs known, min A SLP Short Term Goal 2 (Week 1): Pt will identify 1 physical and 1 cognitive deficit, r/t current hospitalization SLP Short Term Goal 3 (Week 1): Pt will utilize external aids to assist with recall of new information with min verbal cues SLP Short Term Goal 4 (Week 1): Pt will increase functional problem solving in 80% of observable situations.   Skilled Therapeutic Interventions:  Pt was seen for skilled ST targeting cognitive goals.  Upon arrival, pt was partially reclined in bed, awake, lethargic, but agreeable to participate in ST.  SLP facilitated the session with a basic familiar problem solving task targeting numerical reasoning as well as self monitoring and correction of errors.  Pt completed functional math calculations from word problems for 100% accuracy with min assist cues to use paper and pencil when completing longer calculations to compensate for working memory deficits.  SLP also facilitated the session with a structured new learning task targeting use of word-picture associations as a memory compensatory aid.  Pt completed the abovementioned task with min faded to supervision cues for recall of categories; min assist for mental flexibility to generate category members; mod-max assist for recall of specific category members as task progressed.  Pt was supervision assist for route recall from the day room to his room and asked for assistance appropriately once returned to the room to convey toileting needs.  Pt required mod assist question cues to recall that he needed his rolling walker for safety when ambulating from the chair to  the bathroom.  Pt handed off to nurse tech at the end of the session to finish toileting.  Continue per current plan of care.     FIM:  Comprehension Comprehension Mode: Auditory Comprehension: 6-Follows complex conversation/direction: With extra time/assistive device Expression Expression Mode: Verbal Expression: 6-Expresses complex ideas: With extra time/assistive device Social Interaction Social Interaction: 6-Interacts appropriately with others with medication or extra time (anti-anxiety, antidepressant). Problem Solving Problem Solving: 4-Solves basic 75 - 89% of the time/requires cueing 10 - 24% of the time Memory Memory: 4-Recognizes or recalls 75 - 89% of the time/requires cueing 10 - 24% of the time  Pain Pain Assessment Pain Assessment: No/denies pain  Therapy/Group: Individual Therapy  Drishti Pepperman, Melanee SpryNicole L 10/27/2014, 3:17 PM

## 2014-10-27 NOTE — Progress Notes (Signed)
Patient information reviewed and entered into eRehab system by Andreah Goheen, RN, CRRN, PPS Coordinator.  Information including medical coding and functional independence measure will be reviewed and updated through discharge.     Per nursing patient was given "Data Collection Information Summary for Patients in Inpatient Rehabilitation Facilities with attached "Privacy Act Statement-Health Care Records" upon admission.  

## 2014-10-27 NOTE — Progress Notes (Signed)
Physical Therapy Session Note  Patient Details  Name: Mason Stevens Stenzel MRN: [id number] Date of Birth: 1926-12-28  Today's Date: 10/27/2014 PT Individual Time: 1430-1530 PT Individual Time Calculation (min): 60 min   Short Term Goals: Week 1:  PT Short Term Goal 1 (Week 1): STGs = LTGs due to ELOS  Skilled Therapeutic Interventions/Progress Updates:    Pt received semi reclined in bed; agreeable to therapy. Session focused on increasing activity tolerance, improving stability/independence with functional mobility in home and community environments. Transported pt to/from hospital lobby in w/c with total A for energy conservation. Outside hospital lobby, pt negotiated 5 stairs diagonally with bilat UE support at R rail (to simulate home entrance) with min A, verbal cueing for technique. After seated rest break, pt performed gait 2 x80' in community environment (over sidewalk, pavers) with rolling walker and min A, cueing for safe proximity to rolling walker, increased RLE step length to increase LLE weightbearing. Pt performed w/c pobility x150' in community environment with bilat UE's and supervision, increased time. Returned to rehab unit apartment, where pt performed multiple sit<>stand transfers and short-distance gait trials (<15' each) from w/c <> couch <> bed  in home environment with rolling walker and min guard to min A; cueing focused on transfer setup, safe hand placement when using rolling walker during transfers; pt with effective return demonstration. Session ended in pt room, where pt was left seated in w/c with NT present to assist pt to bathroom.  Therapy Documentation Precautions:  Precautions Precautions: Fall Restrictions Weight Bearing Restrictions: No Pain: Pain Assessment Pain Assessment: No/denies pain Locomotion : Ambulation Ambulation/Gait Assistance: 4: Min assist;4: Min guard Wheelchair Mobility Distance: 150   See FIM for current functional  status  Therapy/Group: Individual Therapy  Calvert CantorHobble, Blair A 10/27/2014, 6:28 PM

## 2014-10-27 NOTE — Progress Notes (Signed)
Occupational Therapy Session Note  Patient Details  Name: Mason Stevens MRN: [id number] Date of Birth: [birth year]  Today's Date: 10/27/2014 OT Individual Time: [id number] OT Individual Time Calculation (min): 60 min    Short Term Goals: Week 1:   STG = LTGs   Skilled Therapeutic Interventions/Progress Updates:    Engaged in ADL retraining with focus on functional mobility, transfers, and safety with RW.  Pt with c/o pain in Lt foot, required min/steady assist with ambulation and transfers due to guarding of LLE with mobility.  Toilet transfer and toileting with min assist for stability.  Educated on walk-in shower transfer to increase safety.  Pt with LOB upon sit > stand when exiting shower, requiring mod assist to correct and regain balance. Dressing completed with setup assist at sit > stand level.  Educated on RW safety with functional mobility in home environment and completed simulated (blue box) shower transfer with 3" ledge with stepping over backwards.  Pt return demonstrated shower transfer with min/steady assist and use of RW.  Pt left seated in w/c with feet propped up on bed to decrease pain in Lt foot.  Therapy Documentation Precautions:  Precautions Precautions: Fall Restrictions Weight Bearing Restrictions: No General:   Vital Signs: Therapy Vitals BP: 139/70 mmHg Pain:  Pt reports pain 5/10 in Lt heel, RN notified and meds provided ADL: ADL ADL Comments: Refer to FIM  See FIM for current functional status  Therapy/Group: Individual Therapy  Rosalio LoudHOXIE, Treyce Spillers 10/27/2014, 10:58 AM

## 2014-10-27 NOTE — IPOC Note (Signed)
Overall Plan of Care [facility]) Patient Details Name: Mason Stevens MRN: [id number] DOB: 1927/01/26  Admitting Diagnosis: B SDH  Crani   Hospital Problems: Active Problems:   SDH (subdural hematoma)     Functional Problem List: Nursing Behavior, Endurance, Medication Management, Motor, Nutrition, Pain, Perception, Safety, Sensory, Skin Integrity  PT Balance, Pain, Safety, Endurance, Sensory, Motor, Skin Integrity  OT Balance, Endurance, Pain  SLP Cognition  TR         Basic ADL's: OT Bathing, Dressing, Toileting     Advanced  ADL's: OT Light Housekeeping, Simple Meal Preparation     Transfers: PT Bed Mobility, Bed to Chair, Car, Occupational psychologist, Research scientist (life sciences): PT Ambulation, Stairs     Additional Impairments: OT None  SLP        TR      Anticipated Outcomes Item Anticipated Outcome  Self Feeding I  Swallowing  Independent   Basic self-care  mod I  Toileting  mod I   Bathroom Transfers mod I to toilet, S to shower  Bowel/Bladder  Supervision  Transfers  Mod I  Locomotion  Mod I ambulation  Communication  Independent  Cognition  Min A  Pain  <2 on a 0-10 pain scale  Safety/Judgment  Min/mod assist   Therapy Plan: PT Intensity: Minimum of 1-2 x/day ,45 to 90 minutes PT Frequency: 5 out of 7 days PT Duration Estimated Length of Stay: 7-10 days OT Intensity: Minimum of 1-2 x/day, 45 to 90 minutes OT Frequency: 5 out of 7 days OT Duration/Estimated Length of Stay: 7-10 days SLP Intensity: Minumum of 1-2 x/day, 30 to 90 minutes SLP Frequency: 3 to 5 out of 7 days SLP Duration/Estimated Length of Stay: 7-10 days       Team Interventions: Nursing Interventions Patient/Family Education, Disease Management/Prevention, Pain Management, Medication Management, Skin Care/Wound Management, Discharge Planning, Psychosocial Support  PT interventions Ambulation/gait training, Cognitive remediation/compensation, Discharge planning,  DME/adaptive equipment instruction, Functional mobility training, Pain management, Psychosocial support, Therapeutic Activities, UE/LE Strength taining/ROM, Warden/ranger, Community reintegration, Disease management/prevention, Neuromuscular re-education, Patient/family education, Skin care/wound management, Functional electrical stimulation, Stair training, Therapeutic Exercise, UE/LE Coordination activities  OT Interventions Warden/ranger, Discharge planning, DME/adaptive equipment instruction, Functional mobility training, Patient/family education, Psychosocial support, Self Care/advanced ADL retraining, Therapeutic Activities, Therapeutic Exercise  SLP Interventions Cognitive remediation/compensation, Functional tasks, Patient/family education, Internal/external aids, Cueing hierarchy, Medication managment, Speech/Language facilitation, Multimodal communication approach  TR Interventions    SW/CM Interventions      Team Discharge Planning: Destination: PT-Home ,OT- Home , SLP-Home Projected Follow-up: PT-Outpatient PT, Other (comment) (level of supervision tbd), OT-  Home health OT, SLP-None Projected Equipment Needs: PT-To be determined, OT- None recommended by OT, SLP-None recommended by SLP Equipment Details: PT-Unclear if cane & rollator at home are borrowed or owned by pt - his son brought them to the pt's home prior to admit. , OT-Pt has a shower chair. Patient/family involved in discharge planning: PT- Patient,  OT-Patient, SLP-Patient  MD ELOS: 10-12d Medical Rehab Prognosis:  Good Assessment: 81 y.o. right handed male with history of hypertension, diabetes mellitus and peripheral neuropathy. Admitted 10/16/2014 with frequent falls as well as altered mental status. Initially presented to [facility]. Cranial CT scan showed large bilateral subdural hematomas. Patient was transferred to [facility] and underwent bilateral craniotomy  hematoma evacuation of subdural hematoma 10/16/2014 per Dr. Venetia Maxon. Placed on Keppra for seizure prophylaxis. Follow-up cranial CT scan 10/21/2014 with no acute changes  Now requiring 24/7 Rehab RN,MD, as well as CIR level PT, OT and SLP.  Treatment team will focus on Cognition,  ADLs and mobility with goals set at Mod I  See Team Conference Notes for weekly updates to the plan of care

## 2014-10-27 NOTE — Progress Notes (Signed)
Inpatient Diabetes Program Recommendations  AACE/ADA: New Consensus Statement on Inpatient Glycemic Control (2013)  Target Ranges:  Prepandial:   less than 140 mg/dL      Peak postprandial:   less than 180 mg/dL (1-2 hours)      Critically ill patients:  140 - 180 mg/dL  Results for Mason Stevens, Mason Stevens (MRN [id number]) as of 10/27/2014 10:31  Ref. Range 10/27/2014 08:10  Glucose Latest Range: 70-99 mg/dL 387260 (H)   Results for Mason Stevens, Mason Stevens (MRN [id number]) as of 10/27/2014 10:31  Ref. Range 10/26/2014 06:59 10/26/2014 12:02 10/26/2014 17:07 10/26/2014 20:46  Glucose-Capillary Latest Range: 70-99 mg/dL 884182 (H) 166131 (H) 063240 (H) 200 (H)   Current orders for Inpatient glycemic control: Novolog 0-15 units TID with meals, Metformin 1000 mg BID  Inpatient Diabetes Program Recommendations Insulin - Basal: Noted in Dr. Riley KillSwartz progress note for today, that NPH 4 units BID would be started but no orders entered for NPH. Called unit and talked with Victorino DikeJennifer, RN and asked that she discuss with Dr. Riley KillSwartz. Insulin - Correction- May want to consider ordering Novolog bedtime correction scale.  Thanks, Orlando PennerMarie Xiomara Sevillano, RN, MSN, CCRN, CDE Diabetes Coordinator Inpatient Diabetes Program [phone] (Team Pager) [phone] ([facility] office) [phone] [facility] office)

## 2014-10-28 ENCOUNTER — Inpatient Hospital Stay (HOSPITAL_COMMUNITY): Payer: Medicare HMO | Admitting: Physical Therapy

## 2014-10-28 ENCOUNTER — Inpatient Hospital Stay (HOSPITAL_COMMUNITY): Payer: Medicare HMO | Admitting: Speech Pathology

## 2014-10-28 ENCOUNTER — Inpatient Hospital Stay (HOSPITAL_COMMUNITY): Payer: Medicare HMO

## 2014-10-28 DIAGNOSIS — S065X2S Traumatic subdural hemorrhage with loss of consciousness of 31 minutes to 59 minutes, sequela: Secondary | ICD-10-CM

## 2014-10-28 LAB — GLUCOSE, CAPILLARY
GLUCOSE-CAPILLARY: 107 mg/dL — AB (ref 70–99)
GLUCOSE-CAPILLARY: 199 mg/dL — AB (ref 70–99)
GLUCOSE-CAPILLARY: 139 mg/dL — AB (ref 70–99)
Glucose-Capillary: 168 mg/dL — ABNORMAL HIGH (ref 70–99)

## 2014-10-28 NOTE — Progress Notes (Signed)
Occupational Therapy Session Note  Patient Details  Name: Mason Stevens MRN: [id number] Date of Birth: [birth year]  Today's Date: 10/28/2014 OT Individual Time: 0800-0900 OT Individual Time Calculation (min): 60 min    Short Term Goals: Week 1:   STG=LTG secondary to ELOS less than 7 days  Skilled Therapeutic Interventions/Progress Updates:    Pt resting in w/c upon arrival and agreeable to therapy including bathing at shower level and dressing with sit<>stand from w/c.  Pt declined ambulation this morning reporting that his left heel was painful and he was hesitant to put weight through it if avoidable.  Pt requested use of toilet and performed stand pivot transfer w/c->toilet with steady A.  Pt agreed to amb with RW from toilet to shower with min A.  Pt completed bathing and dressing tasks with close supervision.  Pt completed grooming tasks at seated at sink.  Focus on activity tolerance, functional transfers, sit<>stand, standing balance, and safety awareness.  Therapy Documentation Precautions:  Precautions Precautions: Fall Restrictions Weight Bearing Restrictions: No General:   Vital Signs: Therapy Vitals BP: (!) 131/59 mmHg Pain: Pain Assessment Pain Assessment: 0-10 Pain Score: 5  Pain Type: Acute pain Pain Location: Heel Pain Orientation: Left Pain Descriptors / Indicators: Aching Pain Onset: On-going Pain Intervention(s): RN made aware;Heat applied  See FIM for current functional status  Therapy/Group: Individual Therapy  Rich BraveLanier, Agustina Witzke Chappell 10/28/2014, 10:02 AM

## 2014-10-28 NOTE — Progress Notes (Signed)
[facility] PHYSICAL MEDICINE & REHABILITATION     PROGRESS NOTE    Subjective/Complaints: Urine less frequent. No odor today  Objective: Vital Signs: Blood pressure 126/54, pulse 83, temperature 98.2 F (36.8 C), temperature source Oral, resp. rate 18, height 5\' 10"  (1.778 m), weight 73.3 kg (161 lb 9.6 oz), SpO2 99 %. No results found.  Recent Labs  10/27/14 0810  WBC 6.3  HGB 13.6  HCT 40.2  PLT 322    Recent Labs  10/27/14 0810  NA 133*  K 3.4*  CL 97  GLUCOSE 260*  BUN 15  CREATININE 1.06  CALCIUM 8.9   CBG (last 3)   Recent Labs  10/27/14 1644 10/27/14 2111 10/28/14 0648  GLUCAP 160* 119* 168*    Wt Readings from Last 3 Encounters:  10/24/14 73.3 kg (161 lb 9.6 oz)  10/20/14 74.4 kg (164 lb 0.4 oz)    Physical Exam:  Constitutional: He is oriented to person, place, and time. urine with odor Eyes: EOM are normal.  Neck: Normal range of motion. Neck supple. No thyromegaly present.  Cardiovascular:  Cardiac rate controlled  Respiratory: Effort normal and breath sounds normal. No respiratory distress.  GI: Soft. Bowel sounds are normal. He exhibits no distension.  Neurological: He is alert and oriented to person, place, and time.  Follows commands. Fair awareness of deficits. Alert. Moves all 4's. Sitting upright in chair.  Skin:  Craniotomy sites clean with staples Psychiatric:  Fairly pleasant and appropriate  Motor strength 4/5 in BUE and BLE  Assessment/Plan: 1. Functional deficits secondary to traumatic bilateral subdural hematomas which require 3+ hours per day of interdisciplinary therapy in a comprehensive inpatient rehab setting. Physiatrist is providing close team supervision and 24 hour management of active medical problems listed below. Physiatrist and rehab team continue to assess barriers to discharge/monitor patient progress toward functional and medical goals. FIM: FIM - Bathing Bathing Steps Patient Completed: Chest,  Right Arm, Left Arm, Abdomen, Left upper leg, Right upper leg, Buttocks, Front perineal area, Right lower leg (including foot), Left lower leg (including foot) Bathing: 5: Supervision: Safety issues/verbal cues  FIM - Upper Body Dressing/Undressing Upper body dressing/undressing steps patient completed: Thread/unthread right sleeve of pullover shirt/dresss, Thread/unthread left sleeve of pullover shirt/dress, Put head through opening of pull over shirt/dress, Pull shirt over trunk, Thread/unthread right sleeve of front closure shirt/dress, Thread/unthread left sleeve of front closure shirt/dress, Button/unbutton shirt Upper body dressing/undressing: 4: Min-Patient completed 75 plus % of tasks FIM - Lower Body Dressing/Undressing Lower body dressing/undressing steps patient completed: Thread/unthread right underwear leg, Thread/unthread left underwear leg, Pull underwear up/down, Thread/unthread right pants leg, Thread/unthread left pants leg, Pull pants up/down, Don/Doff right sock, Don/Doff left sock Lower body dressing/undressing: 4: Steadying Assist  FIM - Toileting Toileting steps completed by patient: Adjust clothing prior to toileting, Performs perineal hygiene, Adjust clothing after toileting Toileting Assistive Devices: Grab bar or rail for support Toileting: 4: Steadying assist  FIM - Diplomatic Services operational officerToilet Transfers Toilet Transfers Assistive Devices: Environmental consultantWalker, Therapist, musicGrab bars Toilet Transfers: 4-To toilet/BSC: Min A (steadying Pt. > 75%), 4-From toilet/BSC: Min A (steadying Pt. > 75%)  FIM - Bed/Chair Transfer Bed/Chair Transfer Assistive Devices: Walker, Arm rests Bed/Chair Transfer: 4: Chair or W/C > Bed: Min A (steadying Pt. > 75%), 4: Bed > Chair or W/C: Min A (steadying Pt. > 75%)  FIM - Locomotion: Wheelchair Distance: 150 Locomotion: Wheelchair: 5: Travels 150 ft or more: maneuvers on rugs and over door sills with supervision, cueing or coaxing  FIM - Locomotion: Ambulation Locomotion: Ambulation  Assistive Devices: Designer, industrial/product Ambulation/Gait Assistance: 4: Min assist, 4: Min guard Locomotion: Ambulation: 2: Travels 50 - 149 ft with minimal assistance (Pt.>75%)  Comprehension Comprehension Mode: Auditory Comprehension: 6-Follows complex conversation/direction: With extra time/assistive device  Expression Expression Mode: Verbal Expression: 6-Expresses complex ideas: With extra time/assistive device  Social Interaction Social Interaction: 6-Interacts appropriately with others with medication or extra time (anti-anxiety, antidepressant).  Problem Solving Problem Solving Mode: Not assessed Problem Solving: 4-Solves basic 75 - 89% of the time/requires cueing 10 - 24% of the time  Memory Memory Mode: Not assessed Memory: 4-Recognizes or recalls 75 - 89% of the time/requires cueing 10 - 24% of the time  Medical Problem List and Plan:  1. Functional deficits secondary to traumatic bilateral subdural hematoma status post craniotomies 10/16/2014 2. DVT Prophylaxis/Anticoagulation: Sequential compression devices. No anticoagulation at this time due to SDH 3. Pain Management: Hydrocodone as needed. Monitor with increased mobility 4. Seizure prophylaxis. Keppra 500 mg twice a day. Monitor for any seizure activity 5. Neuropsych: This patient is capable of making decisions on his own behalf. 6. Skin/Wound Care: Routine skin checks 7. Fluids/Electrolytes/Nutrition: Strict I&O will follow chemistries 8. Dysphagia. Dysphagia 2 thin liquids.  9. Hypertension. Norvasc 10 mg daily, hydralazine 20 mg twice a day, lisinopril 40 mg daily and hydrochlorothiazide 25 mg daily. Monitor with increased mobility 10. New onset atrial fibrillation. Troponin negative. Cardiac rate control. Plan aspirin therapy once cleared by neurosurgery 11. Diabetes mellitus with peripheral neuropathy.Glucophage 1000 mg twice a day.    -on NPH insulin at home 4am, 8pm---began 4 bid 12. BPH. Flomax 0.4 mg daily.  Check PVR's 3 more times---only emptied 4x yesterday  -ua equivocal and culture only 10k colonies 13.Hyperlipedemia. Pravachol  LOS (Days) 4 A FACE TO FACE EVALUATION WAS PERFORMED  Antoine Vandermeulen T 10/28/2014 7:33 AM

## 2014-10-28 NOTE — Plan of Care (Signed)
Problem: RH Floor Transfers Goal: LTG Patient will perform floor transfers w/assist (PT) LTG: Patient will perform floor transfers with assistance (PT). Added 10/28/14 secondary to fall risk

## 2014-10-28 NOTE — Progress Notes (Signed)
Orthopedic Tech Progress Note Patient Details:  Mason [name] [birth year] [id number]  Ortho Devices Type of Ortho Device: Postop shoe/boot Ortho Device/Splint Location: prafo boot at bedside Ortho Device/Splint Interventions: Casandra DoffingOrdered   Mason Stevens 10/28/2014, 3:42 PM

## 2014-10-28 NOTE — Progress Notes (Signed)
Staples removed per order, patient tolerated well, edges remain well approximated and no bleeding noted.

## 2014-10-28 NOTE — Progress Notes (Signed)
Physical Therapy Session Note  Patient Details  Name: Mason Stevens MRN: [id number] Date of Birth: 08/22/1927  Today's Date: 10/28/2014 PT Individual Time: 1108-1212 PT Individual Time Calculation (min): 64 min   Short Term Goals: Week 1:  PT Short Term Goal 1 (Week 1): STGs = LTGs due to ELOS  Skilled Therapeutic Interventions/Progress Updates:   Pt received in w/c.  Initiated session by discussing patient's goals, functional level PTA, home set up and assistance available at D/C and possible LOS.  Pt reports he is the caregiver for his wife and was very active PTA but he knows he will need help with her at D/C.  Reports his son and daughter will assist.  Pt reports he is anxious to get back home to his wife.  Discussion led into patient's foot pain which pt reports was present PTA but because he was so ambulatory and he was able to keep it "warm" it didn't bother him.  Pt L foot very tender to touch over medial plantar >> calcaneus region.  Pt denies pain that radiates.  Pt reports pain is greatest when he first steps down but does ease with activity.  Pt appears to be experiencing flare of plantar fasciitis and will discuss with team use of night splint for management.  Educated pt on self stretching L foot and use of tennis ball for soft tissue massage each morning.  Performed sit <> stand training from elevated chair without use of UE and with focus on full hip, knee and ankle flexion <> extension and anterior weight shift of COG over BOS to prevent posterior LOB and increase stretch in L gastroc/plantar fascia.  Pt did report pain but was able, with repetition to self correct posterior LOB and shift weight more forward during sit >stand.      Pt transported to gym in w/c with min A overall secondary to w/c veering to L.  In gym performed gait training and assessment with and without RW; with RW x 75' x 2 pt required min A and multiple verbal cues for safe sequence and distance to RW, more  upright posture, increased stance time on LLE and step length RLE.  Gait assessment without AD pt unable to increase stance time on LLE or increase step length RLE secondary to inability to fully weight bear on L foot.  Discussed with pt use of RW at home initially for safety secondary to foot pain.  Transitioned to stair negotiation training with pt performing up/down 3 stairs with R rail x 2-3 reps with min-mod A overall with step to and alternating sequence secondary to L foot pain.    At end of session pt returned to room in w/c with son present.  Pt transferred to/from toilet with RW and min A; performed all toileting tasks with close S-Min A.  Pt left in w/c to eat lunch.      Therapy Documentation Precautions:  Precautions Precautions: Fall Restrictions Weight Bearing Restrictions: No Vital Signs: Therapy Vitals Temp: 97.6 F (36.4 C) Temp Source: Oral Pulse Rate: (!) 57 Resp: 16 BP: 113/62 mmHg Patient Position (if appropriate): Lying Oxygen Therapy SpO2: 98 % O2 Device: Not Delivered Pain: Pain Assessment Pain Assessment: 0-10 Pain Score: 6  Pain Type: Acute pain Pain Location: Heel Pain Orientation: Left Pain Descriptors / Indicators: Aching Pain Onset: On-going Pain Intervention(s): Medication (See eMAR) Locomotion : Ambulation Ambulation/Gait Assistance: 4: Min assist;4: Min Government social research officerguard Wheelchair Mobility Distance: 150   See FIM for current functional status  Therapy/Group: Individual Therapy  Edman Circle [facility] 10/28/2014, 4:32 PM

## 2014-10-28 NOTE — Plan of Care (Signed)
Problem: RH Balance Goal: LTG Patient will maintain dynamic standing balance (PT) LTG: Patient will maintain dynamic standing balance with assistance during mobility activities (PT)  Downgraded due to persistent pain in L foot limiting pt progress  Problem: RH Ambulation Goal: LTG Patient will ambulate in controlled environment (PT) LTG: Patient will ambulate in a controlled environment, # of feet with assistance (PT).  Downgraded to Supervision due to persistent L foot pain limiting progress Goal: LTG Patient will ambulate in home environment (PT) LTG: Patient will ambulate in home environment, # of feet with assistance (PT).  Downgraded to supervision due to persistent foot pain limiting pt progress Goal: LTG Patient will ambulate in community environment (PT) LTG: Patient will ambulate in community environment, # of feet with assistance (PT).  Downgraded to supervision due to persistent L foot pain limiting progress  Problem: RH Stairs Goal: LTG Patient will ambulate up and down stairs w/assist (PT) LTG: Patient will ambulate up and down # of stairs with assistance (PT)  Downgraded to min A for safety secondary to L foot pain limiting progress

## 2014-10-28 NOTE — Progress Notes (Signed)
Speech Language Pathology Daily Session Note  Patient Details  Name: Mason Stevens MRN: [id number] Date of Birth: 1927-07-21  Today's Date: 10/28/2014 SLP Individual Time: 0900-1000 SLP Individual Time Calculation (min): 60 min  Short Term Goals: Week 1: SLP Short Term Goal 1 (Week 1): Pt will utilize call bell appropriately to make needs known, min A SLP Short Term Goal 2 (Week 1): Pt will identify 1 physical and 1 cognitive deficit, r/t current hospitalization SLP Short Term Goal 3 (Week 1): Pt will utilize external aids to assist with recall of new information with min verbal cues SLP Short Term Goal 4 (Week 1): Pt will increase functional problem solving in 80% of observable situations.   Skilled Therapeutic Interventions: Skilled treatment session focused on cognitive goals. Upon arrival, patient was upright in wheelchair and requested to use the restroom. Patient transferred to commode with SLP assistance, successfully voided, then was transferred back to wheelchair. Student facilitated session by providing extra time and Min A multimodal cues for problem solving, short-term memory, and monitoring of errors during functional money management task. SLP further facilitated session through Min A multimodal cues for recall of previous therapy sessions and Mod A question cues for emerging awareness of physical deficits. Patient was Mod I for recall of room location and required Supervision A cues for mental flexibility regarding medication organization method upon discharge. Patient transferred back to bed at end of session with 3 siderails raised, bed alarm on, and all needs within reach. Continue with current plan of care.   FIM:  Comprehension Comprehension Mode: Auditory Comprehension: 5-Understands complex 90% of the time/Cues < 10% of the time Expression Expression Mode: Verbal Expression: 6-Expresses complex ideas: With extra time/assistive device Social Interaction Social  Interaction: 6-Interacts appropriately with others with medication or extra time (anti-anxiety, antidepressant). Problem Solving Problem Solving: 4-Solves basic 75 - 89% of the time/requires cueing 10 - 24% of the time Memory Memory: 4-Recognizes or recalls 75 - 89% of the time/requires cueing 10 - 24% of the time  Pain Pain Assessment Pain Assessment: 0-10 Pain Score: 5  Pain Type: Acute pain Pain Location: Heel Pain Orientation: Left Pain Descriptors / Indicators: Aching Pain Onset: On-going Pain Intervention(s): RN made aware;Heat applied  Therapy/Group: Individual Therapy  Tacey RuizKatherine Liala Codispoti 10/28/2014, 1:31 PM

## 2014-10-28 NOTE — Progress Notes (Signed)
INITIAL NUTRITION ASSESSMENT  Pt meets criteria for SEVERE MALNUTRITION in the context of acute illness as evidenced by severe fat and muscle mass loss.  DOCUMENTATION CODES Per approved criteria  -Severe malnutrition in the context of acute illness or injury   INTERVENTION: Continue Glucerna Shake po once daily, each supplement provides 220 kcal and 10 grams of protein.  Encourage adequate PO intake.  NUTRITION DIAGNOSIS: Malnutrition related to acute illness as evidenced by severe fat and muscle mass loss.   Goal: Pt to meet >/= 90% of their estimated nutrition needs   Monitor:  PO intake, weight trends, labs, I/O's  Reason for Assessment: MST  81 y.o. male  Admitting Dx: Traumatic subdural hemorrhage with loss of consciousness of 31 minutes to 59 minutes  ASSESSMENT: Pt with history of hypertension, diabetes mellitus and peripheral neuropathy. Admitted 10/16/2014 with frequent falls as well as altered mental status. Cranial CT scan showed large bilateral subdural hematomas.  Pt reports having a good appetite. Meal completion has been 95-100%. Son present at pt beside during time of visit. He reports pt has been fine at home with 2 meals a day. PTA pt did have worsening confusion for 3 weeks. Son reports pt's body weight usually at 160 lbs. Noted pt with a 6.9% weight loss in 2 weeks. RD will continue to monitor. Pt currently has Glucerna shake ordered and pt has been consuming them. Will continue with current orders. Pt was encouraged to eat his food at meals and to drink his supplements.   Nutrition Focused Physical Exam:  Subcutaneous Fat:  Orbital Region: N/A Upper Arm Region: Severe depletion Thoracic and Lumbar Region: Severe depletion  Muscle:  Temple Region: N/A Clavicle Bone Region: Severe depletion Clavicle and Acromion Bone Region: Severe depletion Scapular Bone Region: N/A Dorsal Hand: N/A Patellar Region: Severe depletion Anterior Thigh Region: Severe  depletion Posterior Calf Region: Severe depletion  Edema: none  Labs: Low sodium, potassium, and GFR.  Height: Ht Readings from Last 1 Encounters:  10/24/14 5\' 10"  (1.778 m)    Weight: Wt Readings from Last 1 Encounters:  10/24/14 161 lb 9.6 oz (73.3 kg)    Ideal Body Weight: 166%  % Ideal Body Weight: 97%  Wt Readings from Last 10 Encounters:  10/24/14 161 lb 9.6 oz (73.3 kg)  10/20/14 164 lb 0.4 oz (74.4 kg)  10/17/14 173 lbs  Usual Body Weight: 160 lbs  % Usual Body Weight: 101%  BMI:  Body mass index is 23.19 kg/(m^2).  Estimated Nutritional Needs: Kcal: 1850-2050 Protein: 85-100 grams Fluid: 1.8 - 2 L/day  Skin: incision on head bilateral  Diet Order: Diet Carb Modified  EDUCATION NEEDS: -No education needs identified at this time   Intake/Output Summary (Last 24 hours) at 10/28/14 1009 Last data filed at 10/28/14 0700  Gross per 24 hour  Intake    480 ml  Output    400 ml  Net     80 ml    Last BM: 2/29  Labs:   Recent Labs Lab 10/27/14 0810  NA 133*  K 3.4*  CL 97  CO2 28  BUN 15  CREATININE 1.06  CALCIUM 8.9  GLUCOSE 260*    CBG (last 3)   Recent Labs  10/27/14 1644 10/27/14 2111 10/28/14 0648  GLUCAP 160* 119* 168*    Scheduled Meds: . amLODipine  10 mg Oral Daily  . docusate sodium  100 mg Oral BID  . feeding supplement (GLUCERNA SHAKE)  237 mL Oral Q24H  .  hydrALAZINE  20 mg Oral BID  . lisinopril  40 mg Oral Daily   And  . hydrochlorothiazide  25 mg Oral Daily  . insulin aspart  0-15 Units Subcutaneous TID WC  . levETIRAcetam  500 mg Oral BID  . metFORMIN  1,000 mg Oral BID WC  . multivitamin with minerals  1 tablet Oral Daily  . pantoprazole  40 mg Oral Q1200  . pravastatin  10 mg Oral q1800    Continuous Infusions:   No past medical history on file.  Past Surgical History  Procedure Laterality Date  . Craniotomy Bilateral 10/16/2014    Procedure: BILATERALCRANIOTOMY HEMATOMA EVACUATION SUBDURAL;   Surgeon: Maeola Harman, MD;  Location: [facility];  Service: Neurosurgery;  Laterality: Bilateral;  BILATERALCRANIOTOMY HEMATOMA EVACUATION SUBDURAL    Marijean Niemann, MS, RD, LDN Pager # [phone] After hours/ weekend pager # [phone]

## 2014-10-29 ENCOUNTER — Inpatient Hospital Stay (HOSPITAL_COMMUNITY): Payer: Medicare HMO | Admitting: Speech Pathology

## 2014-10-29 ENCOUNTER — Inpatient Hospital Stay (HOSPITAL_COMMUNITY): Payer: Medicare HMO | Admitting: Physical Therapy

## 2014-10-29 ENCOUNTER — Inpatient Hospital Stay (HOSPITAL_COMMUNITY): Payer: Medicare HMO | Admitting: Occupational Therapy

## 2014-10-29 DIAGNOSIS — M7732 Calcaneal spur, left foot: Secondary | ICD-10-CM

## 2014-10-29 DIAGNOSIS — E119 Type 2 diabetes mellitus without complications: Secondary | ICD-10-CM

## 2014-10-29 DIAGNOSIS — I4891 Unspecified atrial fibrillation: Secondary | ICD-10-CM

## 2014-10-29 LAB — GLUCOSE, CAPILLARY
GLUCOSE-CAPILLARY: 175 mg/dL — AB (ref 70–99)
GLUCOSE-CAPILLARY: 166 mg/dL — AB (ref 70–99)
Glucose-Capillary: 137 mg/dL — ABNORMAL HIGH (ref 70–99)
Glucose-Capillary: 126 mg/dL — ABNORMAL HIGH (ref 70–99)

## 2014-10-29 NOTE — Progress Notes (Signed)
Patient in bed. Previously in shift (approx 2115) C/O L heel pain, Medicated with PRN Tylenol per request.  Profo boot applied to L heel per orders, explained use of and reasoning for Profo boot to patient. Patient verbalized understanding.Stated " I will try it, but might want it off later." Encouraged to leave on as long as possible. Patient also refused SCDs previously in shift despite education. Patient called staff into room, requesting removal of Profo boot due to "uncomfortable," continues to refuse SCDs. Attempts at education met with resistance, patient does not want anything on feet or legs at nights, "I just can't sleep."  Lonell Face, RN

## 2014-10-29 NOTE — Progress Notes (Signed)
Social Work Assessment and Plan  Patient Details  Name: Mason Stevens MRN: [id number] Date of Birth: June 02, 1927  Today's Date: 10/29/2014  Problem List:  Patient Active Problem List   Diagnosis Date Noted  . Calcaneal spur of left foot 10/29/2014  . Traumatic subdural hemorrhage with loss of consciousness of 31 minutes to 59 minutes 10/24/2014  . Protein-calorie malnutrition, severe 10/17/2014  . Subdural hematoma 10/16/2014  . Atrial fibrillation 10/16/2014  . Type 2 diabetes mellitus without complication   . Anemia due to other cause    Past Medical History: No past medical history on file. Past Surgical History:  Past Surgical History  Procedure Laterality Date  . Craniotomy Bilateral 10/16/2014    Procedure: BILATERALCRANIOTOMY HEMATOMA EVACUATION SUBDURAL;  Surgeon: Erline Levine, MD;  Location: [place] [facility];  Service: Neurosurgery;  Laterality: Bilateral;  BILATERALCRANIOTOMY HEMATOMA EVACUATION SUBDURAL   Social History:  has no tobacco, alcohol, and drug history on file.  Family / Support Systems Marital Status: Married How Long?: 1 years Patient Roles: Spouse, Parent Spouse/Significant Other: Daijon Wenke - wife - [phone] - pt is the caregiver for wife, however, as she has dementia Children: Jorma Tassinari - son here from [place] - [phone]   Aristeo Hankerson - son who lives in another state - [phone]   Braeden Kennan - dtr - [phone] Other Supports: extended family Anticipated Caregiver: sons and daughter Ability/Limitations of Caregiver: Sons and dtr plan to share 24 hr care for parents after rehab. Caregiver Availability: 24/7 Family Dynamics: Pt's children are committed to taking care of their parents and they do not want pt to go to a nursing home.  Social History Preferred language: English Religion: Patient Refused Education: Pt completed the 5th grade.  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 Read: Yes Write: Yes Employment Status: Retired Date Retired/Disabled/Unemployed: 20+ years ago Age Retired: 76 Public relations account executive Issues: none reported Guardian/Conservator: N/A   Abuse/Neglect Physical Abuse: Denies Verbal Abuse: Denies Sexual Abuse: Denies Exploitation of patient/patient's resources: Denies Self-Neglect: Denies  Emotional Status Pt's affect, behavior and adjustment status: Pt is very pleasant and motivated to get stronger, but he is concerned about his wife and wants to be home with her as soon as possible.  He feels he will also do better in his home environment. Recent Psychosocial Issues: none reported Psychiatric History: none reported Substance Abuse History: none reported  Patient / Family Perceptions, Expectations & Goals Pt/Family understanding of illness & functional limitations: Pt/son express an understanding of pt's condition and limitations.  Son is willing to help pt at home, as he feels it is more important to get him home to his wife than to stay on CIR until he reaches his mod I goals. 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  He was still chopping wood and bringing it into the house to burn.  He goes out in the community a few times a week, as well. Anticipated changes in roles/activities/participation: Pt wants to resume his activities when he is safe to do so, but more importantly, he wants to be home with his wife. Pt/family expectations/goals: Pt wants to return home as soon as possible as he has been the caregiver for his wife of 34 years and she is in the end stages of dementia, with hospice starting this week.  Community [employer] Agencies: None Premorbid Home Care/DME Agencies: Other (Comment) (had [facility] in the  past for his wife.  Hospice is involved for wife currently.) Transportation available at discharge: children  Discharge Planning Living Arrangements:  Spouse/significant other, Children Support Systems: Spouse/significant other, Children, Other relatives, Friends/neighbors Type of Residence: Private residence Insurance Resources: Multimedia programmer (specify) Scientist, clinical (histocompatibility and immunogenetics) Medicare) Financial Resources: [place] Referred: No Living Expenses: Own Money Management: Patient Does the patient have any problems obtaining your medications?: No Home Management: Pt was doing most of this PTA.  His children will complete household chores now. Patient/Family Preliminary Plans: Pt plans to go to his home at d/c to be with his wife and children.  Pt's children are taking shifts to be with their parents.  They are committed to caring for their parents at home. Barriers to Discharge: Steps Social Work Anticipated Follow Up Needs: HH/OP Expected length of stay: 7-9 days  Clinical Impression CSW met with pt and his son, Eulas Post, on 10-28-14 to introduce self and role of CSW, as well as to complete assessment.  Pt has been caring for his wife who has dementia and now their children are helping with her care while pt has been hospitalized.  Wife is in the end stages of her disease and hospice has accepted her into their program this week.  Children are going to provide care to both of their parents during this time and will take shifts to always have someone present with them.  Pt is eager to get well/strong, but feels pulled to get home as soon as possible to be with his wife.  CSW is working with therapists and Dr. Naaman Plummer to see how we can work this out for pt.  Pt will need f/u therapies, especially if he leaves prior to reaching his mod I goals.  CSW will continue to follow and assist as needed. Edsel Shives, Silvestre Mesi 10/29/2014, 12:15 PM

## 2014-10-29 NOTE — Progress Notes (Signed)
Speech Language Pathology Daily Session Note  Patient Details  Name: Mason Stevens MRN: [id number] Date of Birth: 01/09/1927  Today's Date: 10/29/2014 SLP Individual Time: 0800-0900 SLP Individual Time Calculation (min): 60 min  Short Term Goals: Week 1: SLP Short Term Goal 1 (Week 1): Pt will utilize call bell appropriately to make needs known, min A SLP Short Term Goal 2 (Week 1): Pt will identify 1 physical and 1 cognitive deficit, r/t current hospitalization SLP Short Term Goal 3 (Week 1): Pt will utilize external aids to assist with recall of new information with min verbal cues SLP Short Term Goal 4 (Week 1): Pt will increase functional problem solving in 80% of observable situations.   Skilled Therapeutic Interventions: Skilled treatment session focused on cognitive goals. Upon arrival, patient was sitting upright in wheelchair eating breakfast and preparing to take morning medication. Patient then requested to use the restroom and change hospital gown; patient transferred to commode and donned clean hospital gown with SLP assistance, then was transferred back to wheelchair for therapy. Student facilitated session by providing extra time and Min A multimodal cues for problem-solving and utilization of external memory aid during functional medication organization task; as task progressed, student faded to providing Supervision A cues due to patient familiarization to task. Patient required overall Min A multimodal cues for monitoring of errors during task. Student further facilitated session by providing Min A question cues for safety awareness and mental flexibility relating to medication management upon discharge and Mod A question cues for emerging awareness of cognitive deficits. Patient left in wheelchair with all needs within reach. Continue with current plan of care.    Pain Pain Assessment Pain Assessment: 0-10 Pain Score: 3  Pain Type: Acute pain Pain Location: Heel Pain  Orientation: Left Pain Descriptors / Indicators: Aching;Sharp;Shooting Pain Frequency: Intermittent Pain Onset: On-going Pain Intervention(s): Medication (See eMAR)  Therapy/Group: Individual Therapy  Tacey RuizKatherine Sukari Grist 10/29/2014, 11:26 AM

## 2014-10-29 NOTE — Discharge Summary (Signed)
Discharge summary job # [id number]

## 2014-10-29 NOTE — Progress Notes (Signed)
Occupational Therapy Session Note  Patient Details  Name: Mason Stevens Soy MRN: [id number] Date of Birth: 1927/08/28  Today's Date: 10/29/2014 OT Individual Time: [id number] OT Individual Time Calculation (min): 60 min    Short Term Goals: Week 1:   STG=LTGs  Skilled Therapeutic Interventions/Progress Updates:  Upon entering the room, pt seated in recliner chair with son present in room. Pt with 5/10 pain in L foot during session. Son will be primary caregiver when returning home and is present for hands on training during session. Pt ambulated with RW and supervision - steady assist with assist from son into bathroom for toileting. Toilet transfer with steady assist onto standard toilet with use of grab bars as needed for safety. Pt performing clothing management and hygiene with steady assist as well. Caregiver providing steady assist for transfer onto shower seat in walk in shower. Bathing supervision assistance and ambulating to sit into wheelchair for dressing tasks with RW.Once dressed therapist educated pt and caregiver on energy conservation, fall risk, and expectations for communication between caregiver and pt for safety. Pt and family verbalized understanding of information. Pt returning to bed with assist from caregiver. Bed alarm activated and call bell within reach upon exiting the room.   Therapy Documentation Precautions:  Precautions Precautions: Fall Restrictions Weight Bearing Restrictions: No Pain: Pain Assessment Pain Location: Heel ADL: ADL ADL Comments: Refer to FIM  See FIM for current functional status  Therapy/Group: Individual Therapy  Lowella Gripittman, Roiza Wiedel L 10/29/2014, 11:47 AM

## 2014-10-29 NOTE — Progress Notes (Addendum)
[facility] Individual Statement of Services  Patient Name:  Mason Stevens  Date:  10/29/2014  Welcome to the [facility].  Our goal is to provide you with an individualized program based on your diagnosis and situation, designed to meet your specific needs.  With this comprehensive rehabilitation program, you will be expected to participate in at least 3 hours of rehabilitation therapies Monday-Friday, with modified therapy programming on the weekends.  Your rehabilitation program will include the following services:  Physical Therapy (PT), Occupational Therapy (OT), Speech Therapy (ST), 24 hour per day rehabilitation nursing, Case Management (Social Worker), Rehabilitation Medicine, Nutrition Services and Pharmacy Services  Weekly team conferences will be held on Tuesdays to discuss your progress.  Your Social Worker will talk with you frequently to get your input and to update you on team discussions.  Team conferences with you and your family in attendance may also be held.  Expected length of stay:  7 to 10 days  Overall anticipated outcome:  Supervision  Depending on your progress and recovery, your program may change. Your Social Worker will coordinate services and will keep you informed of any changes. Your Social Worker's name and contact numbers are listed  below.  The following services may also be recommended but are not provided by the [facility]:   Driving Evaluations  Home Health Rehabiltiation Services  Outpatient Rehabilitation Services   Arrangements will be made to provide these services after discharge if needed.  Arrangements include referral to agencies that provide these services.  Your insurance has been verified to be:  [employer] Medicare Your primary doctor is:  Sharee Pimplehristina White, FNP  Pertinent information will be shared with your doctor and your insurance company.  Social Worker:  Staci AcostaJenny Hanan Moen, LCSW   [phone] or [phone]) [phone]  Information discussed with and copy given to patient by: Elvera LennoxPrevatt, Josy Peaden Capps, 10/29/2014, 9:06 AM

## 2014-10-29 NOTE — Progress Notes (Signed)
[place] PHYSICAL MEDICINE & REHABILITATION     PROGRESS NOTE    Subjective/Complaints: Left heel still tender. Didn't tolerate PRAFO last night. Bladder has been better  Objective: Vital Signs: Blood pressure 146/76, pulse 66, temperature 98.2 F (36.8 C), temperature source Oral, resp. rate 17, height 5\' 10"  (1.778 m), weight 73.3 kg (161 lb 9.6 oz), SpO2 97 %. No results found.  Recent Labs  10/27/14 0810  WBC 6.3  HGB 13.6  HCT 40.2  PLT 322    Recent Labs  10/27/14 0810  NA 133*  K 3.4*  CL 97  GLUCOSE 260*  BUN 15  CREATININE 1.06  CALCIUM 8.9   CBG (last 3)   Recent Labs  10/28/14 1643 10/28/14 2056 10/29/14 0654  GLUCAP 199* 139* 175*    Wt Readings from Last 3 Encounters:  10/24/14 73.3 kg (161 lb 9.6 oz)  10/20/14 74.4 kg (164 lb 0.4 oz)    Physical Exam:  Constitutional: He is oriented to person, place, and time. urine with odor Eyes: EOM are normal.  Neck: Normal range of motion. Neck supple. No thyromegaly present.  Cardiovascular:  Cardiac rate controlled  Respiratory: Effort normal and breath sounds normal. No respiratory distress.  GI: Soft. Bowel sounds are normal. He exhibits no distension.  Neurological: He is alert and oriented to person, place, and time.  Follows commands. Fair awareness of deficits. Alert. Moves all 4's. Sitting upright in chair.  Skin:  Craniotomy sites clean and intact M/S: pain with palpation of anterior heel and with stretching of plantar fascia left foot.  Psychiatric:  Fairly pleasant and appropriate  Motor strength 4/5 in BUE and BLE  Assessment/Plan: 1. Functional deficits secondary to traumatic bilateral subdural hematomas which require 3+ hours per day of interdisciplinary therapy in a comprehensive inpatient rehab setting. Physiatrist is providing close team supervision and 24 hour management of active medical problems listed below. Physiatrist and rehab team continue to assess barriers  to discharge/monitor patient progress toward functional and medical goals. FIM: FIM - Bathing Bathing Steps Patient Completed: Chest, Right Arm, Left Arm, Abdomen, Left upper leg, Right upper leg, Buttocks, Front perineal area, Right lower leg (including foot), Left lower leg (including foot) Bathing: 5: Supervision: Safety issues/verbal cues  FIM - Upper Body Dressing/Undressing Upper body dressing/undressing steps patient completed: Thread/unthread right sleeve of pullover shirt/dresss, Thread/unthread left sleeve of pullover shirt/dress, Put head through opening of pull over shirt/dress, Pull shirt over trunk, Thread/unthread right sleeve of front closure shirt/dress, Thread/unthread left sleeve of front closure shirt/dress, Button/unbutton shirt Upper body dressing/undressing: 5: Supervision: Safety issues/verbal cues FIM - Lower Body Dressing/Undressing Lower body dressing/undressing steps patient completed: Thread/unthread right underwear leg, Thread/unthread left underwear leg, Pull underwear up/down, Thread/unthread right pants leg, Thread/unthread left pants leg, Pull pants up/down, Don/Doff right sock, Don/Doff left sock Lower body dressing/undressing: 5: Supervision: Safety issues/verbal cues  FIM - Toileting Toileting steps completed by patient: Adjust clothing prior to toileting, Performs perineal hygiene, Adjust clothing after toileting Toileting Assistive Devices: Grab bar or rail for support Toileting: 4: Steadying assist  FIM - Diplomatic Services operational officerToilet Transfers Toilet Transfers Assistive Devices: Art gallery managerWalker Toilet Transfers: 4-To toilet/BSC: Min A (steadying Pt. > 75%), 4-From toilet/BSC: Min A (steadying Pt. > 75%)  FIM - Bed/Chair Transfer Bed/Chair Transfer Assistive Devices: Therapist, occupationalWalker Bed/Chair Transfer: 4: Chair or W/C > Bed: Min A (steadying Pt. > 75%), 4: Bed > Chair or W/C: Min A (steadying Pt. > 75%)  FIM - Locomotion: Wheelchair Distance: 150 Locomotion: Wheelchair:  4: Travels 150 ft  or more: maneuvers on rugs and over door sillls with minimal assistance (Pt.>75%) FIM - Locomotion: Ambulation Locomotion: Ambulation Assistive Devices: Designer, industrial/product Ambulation/Gait Assistance: 4: Min assist, 4: Min guard Locomotion: Ambulation: 2: Travels 50 - 149 ft with minimal assistance (Pt.>75%)  Comprehension Comprehension Mode: Auditory Comprehension: 5-Understands complex 90% of the time/Cues < 10% of the time  Expression Expression Mode: Verbal Expression: 6-Expresses complex ideas: With extra time/assistive device  Social Interaction Social Interaction: 6-Interacts appropriately with others with medication or extra time (anti-anxiety, antidepressant).  Problem Solving Problem Solving Mode: Not assessed Problem Solving: 4-Solves basic 75 - 89% of the time/requires cueing 10 - 24% of the time  Memory Memory Mode: Not assessed Memory: 4-Recognizes or recalls 75 - 89% of the time/requires cueing 10 - 24% of the time  Medical Problem List and Plan:  1. Functional deficits secondary to traumatic bilateral subdural hematoma status post craniotomies 10/16/2014 2. DVT Prophylaxis/Anticoagulation: Sequential compression devices. No anticoagulation at this time due to SDH 3. Pain Management: Hydrocodone as needed.  -left calcaneal spurs, plantar fasciitis worsened by bedrest and immobility---ice after therapy   -regular stretching---PRAFO ordered,  Needs to continue using   -voltaren gel   -heel cup at some point? 4. Seizure prophylaxis. Keppra 500 mg twice a day. Monitor for any seizure activity 5. Neuropsych: This patient is capable of making decisions on his own behalf. 6. Skin/Wound Care: Routine skin checks 7. Fluids/Electrolytes/Nutrition: Strict I&O will follow chemistries 8. Dysphagia. Dysphagia 2 thin liquids.  9. Hypertension. Norvasc 10 mg daily, hydralazine 20 mg twice a day, lisinopril 40 mg daily and hydrochlorothiazide 25 mg daily. Monitor with increased  mobility 10. New onset atrial fibrillation. Troponin negative. Cardiac rate control. Plan aspirin therapy once cleared by neurosurgery 11. Diabetes mellitus with peripheral neuropathy.Glucophage 1000 mg twice a day.    -on NPH insulin at home 4qam, 8qpm---began 4 bid and sugars improving.  12. BPH. Flomax 0.4 mg daily. Check PVR's 3 more times---only emptied 4x yesterday  -ua equivocal and culture only 10k colonies 13.Hyperlipedemia. Pravachol  LOS (Days) 5 A FACE TO FACE EVALUATION WAS PERFORMED  Zaara Sprowl T 10/29/2014 7:34 AM

## 2014-10-29 NOTE — Progress Notes (Signed)
Physical Therapy Session Note  Patient Details  Name: Mason Stevens MRN: [id number] Date of Birth: [birth year]  Today's Date: 10/29/2014 PT Individual Time: 1300-1408 PT Individual Time Calculation (min): 68 min   Short Term Goals: Week 1:  PT Short Term Goal 1 (Week 1): STGs = LTGs due to ELOS  Skilled Therapeutic Interventions/Progress Updates:    Pt received seated in w/c accompanied by son; pt agreeable to therapy. Session focused on hands-on family training with son, Algernon HuxleyGlen. This PT explained, demonstrated safe assist and appropriate cueing with all of the following: stand pivot transfers from bed > w/c <> simulated car with rolling walker and supervision, cueing for hand placement; gait x75' with rolling walker and supervision; negotiation of 2 stairs with single R rail, ascending laterally and descending forward with step-to pattern and min A; negotiation of single curb without rail using rolling walker with min guard, manual stabilization of rolling walker. Son then gave effective return demonstration of safe supervision/assistance and appropriate cueing with all described aspects of functional mobility. Explained, demonstrated w/c parts management and w/c breakdown with effective return demonstration.  Educated pt/son on recommendation for supervision with all aspects of functional mobility with exception of basic transfers and bed mobility. Pt /son verbalized understanding. Pt and son verbalized feeling safe and comfortable performing/assisting with all aspects of functional mobility at discharge. Session ended in pt room, where pt was left semi reclined in bed with RN, NT present and all needs within reach; 3 rails up, bed alarm on, and all needs within reach. Per MD order, cold pack placed on medial calcaneus post-session. NT verbally agreed to ensure cold pack was removed within 20 minutes of application.  Therapy Documentation Precautions:  Precautions Precautions:  Fall Restrictions Weight Bearing Restrictions: No Vital Signs: Therapy Vitals Temp: 97.5 F (36.4 C) Temp Source: Oral Pulse Rate: 71 Resp: 16 BP: 116/60 mmHg Patient Position (if appropriate): Lying Oxygen Therapy SpO2: 99 % O2 Device: Not Delivered Pain: Pain Assessment Pain Assessment: 0-10 Pain Score: 4  Pain Type: Acute pain Pain Location: Heel Pain Orientation: Left Pain Descriptors / Indicators: Aching Pain Frequency: Intermittent Pain Onset: On-going Pain Intervention(s): Medication (See eMAR) Locomotion : Ambulation Ambulation/Gait Assistance: 4: Min guard   See FIM for current functional status  Therapy/Group: Individual Therapy  Janie Capp, Lorenda IshiharaBlair A 10/29/2014, 5:04 PM

## 2014-10-30 ENCOUNTER — Inpatient Hospital Stay (HOSPITAL_COMMUNITY): Payer: Medicare HMO | Admitting: Physical Therapy

## 2014-10-30 ENCOUNTER — Encounter (HOSPITAL_COMMUNITY): Payer: Medicare HMO | Admitting: Speech Pathology

## 2014-10-30 ENCOUNTER — Inpatient Hospital Stay (HOSPITAL_COMMUNITY): Payer: Medicare HMO

## 2014-10-30 LAB — GLUCOSE, CAPILLARY
GLUCOSE-CAPILLARY: 168 mg/dL — AB (ref 70–99)
GLUCOSE-CAPILLARY: 121 mg/dL — AB (ref 70–99)

## 2014-10-30 MED ORDER — HYDROCHLOROTHIAZIDE 25 MG PO TABS: 25.0000 mg | ORAL_TABLET | Freq: Every day | ORAL | Status: DC

## 2014-10-30 MED ORDER — METFORMIN HCL 1000 MG PO TABS: 1000.0000 mg | ORAL_TABLET | Freq: Two times a day (BID) | ORAL | Status: AC

## 2014-10-30 MED ORDER — HYDRALAZINE HCL 10 MG PO TABS: 20.0000 mg | ORAL_TABLET | Freq: Two times a day (BID) | ORAL | Status: DC

## 2014-10-30 MED ORDER — PANTOPRAZOLE SODIUM 40 MG PO TBEC: 40.0000 mg | DELAYED_RELEASE_TABLET | Freq: Every day | ORAL | Status: DC

## 2014-10-30 MED ORDER — AMLODIPINE BESYLATE 10 MG PO TABS: 10.0000 mg | ORAL_TABLET | Freq: Every day | ORAL | Status: AC

## 2014-10-30 MED ORDER — LOVASTATIN 20 MG PO TABS: 40.0000 mg | ORAL_TABLET | Freq: Every day | ORAL | Status: AC

## 2014-10-30 MED ORDER — LEVETIRACETAM 500 MG PO TABS: 500.0000 mg | ORAL_TABLET | Freq: Two times a day (BID) | ORAL | Status: DC

## 2014-10-30 MED ORDER — DICLOFENAC SODIUM 1 % TD GEL: 2.0000 g | Freq: Four times a day (QID) | TRANSDERMAL | Status: DC | PRN

## 2014-10-30 MED ORDER — ADULT MULTIVITAMIN W/MINERALS CH: 1.0000 | ORAL_TABLET | Freq: Every day | ORAL | Status: AC

## 2014-10-30 MED ORDER — HYDROCODONE-ACETAMINOPHEN 5-325 MG PO TABS: 1.0000 | ORAL_TABLET | ORAL | Status: DC | PRN

## 2014-10-30 MED ORDER — LISINOPRIL 40 MG PO TABS: 40.0000 mg | ORAL_TABLET | Freq: Every day | ORAL | Status: DC

## 2014-10-30 NOTE — Plan of Care (Signed)
Problem: RH Floor Transfers Goal: LTG Patient will perform floor transfers w/assist (PT) LTG: Patient will perform floor transfers with assistance (PT).  Outcome: Not Met (add Reason) Not tested secondary to significant pain in L calcaneus on discharge.

## 2014-10-30 NOTE — Progress Notes (Signed)
[place] PHYSICAL MEDICINE & REHABILITATION     PROGRESS NOTE    Subjective/Complaints: Left heel with some improvement. Didn't tolerate PRAFO last night. Bladder has been better  Objective: Vital Signs: Blood pressure 162/68, pulse 73, temperature 98.4 F (36.9 C), temperature source Oral, resp. rate 16, height  (1.778 m), weight 73.3 kg (161 lb 9.6 oz), SpO2 98 %. No results found. No results for input(s): WBC, HGB, HCT, PLT in the last 72 hours. No results for input(s): NA, K, CL, GLUCOSE, BUN, CREATININE, CALCIUM in the last 72 hours.  Invalid input(s): CO CBG (last 3)   Recent Labs  10/29/14 1634 10/29/14 2051 10/30/14 0645  GLUCAP 137* 126* 168*    Wt Readings from Last 3 Encounters:  10/24/14 73.3 kg (161 lb 9.6 oz)  10/20/14 74.4 kg (164 lb 0.4 oz)    Physical Exam:  Constitutional: He is oriented to person, place, and time. urine with odor Eyes: EOM are normal.  Neck: Normal range of motion. Neck supple. No thyromegaly present.  Cardiovascular:  Cardiac rate controlled  Respiratory: Effort normal and breath sounds normal. No respiratory distress.  GI: Soft. Bowel sounds are normal. He exhibits no distension.  Neurological: He is alert and oriented to person, place, and time.  Follows commands. Fair awareness of deficits. Alert. Moves all 4's. Sitting upright in chair.  Skin:  Craniotomy sites clean and intact M/S: pain with palpation of anterior heel and with stretching of plantar fascia left foot---improved today Psychiatric:  Fairly pleasant and appropriate  Motor strength 4/5 in BUE and BLE  Assessment/Plan: 1. Functional deficits secondary to traumatic bilateral subdural hematomas which require 3+ hours per day of interdisciplinary therapy in a comprehensive inpatient rehab setting. Physiatrist is providing close team supervision and 24 hour management of active medical problems listed below. Physiatrist and rehab team continue to  assess barriers to discharge/monitor patient progress toward functional and medical goals. FIM: FIM - Bathing Bathing Steps Patient Completed: Chest, Right Arm, Left Arm, Abdomen, Left upper leg, Right upper leg, Buttocks, Front perineal area, Right lower leg (including foot), Left lower leg (including foot) Bathing: 6: Assistive device (Comment)  FIM - Upper Body Dressing/Undressing Upper body dressing/undressing steps patient completed: Thread/unthread right sleeve of pullover shirt/dresss, Thread/unthread left sleeve of pullover shirt/dress, Put head through opening of pull over shirt/dress, Pull shirt over trunk Upper body dressing/undressing: 7: Complete Independence: No helper FIM - Lower Body Dressing/Undressing Lower body dressing/undressing steps patient completed: Thread/unthread right underwear leg, Thread/unthread left underwear leg, Pull underwear up/down, Thread/unthread right pants leg, Thread/unthread left pants leg, Pull pants up/down, Don/Doff right sock, Don/Doff left sock, Don/Doff right shoe, Don/Doff left shoe, Fasten/unfasten right shoe, Fasten/unfasten left shoe Lower body dressing/undressing: 6: Assistive device (Comment)  FIM - Toileting Toileting steps completed by patient: Adjust clothing prior to toileting, Performs perineal hygiene, Adjust clothing after toileting Toileting Assistive Devices: Grab bar or rail for support Toileting: 6: Assistive device: No helper  FIM - Diplomatic Services operational officer Devices: Elevated toilet seat, Grab bars, Art gallery manager Transfers: 6-Assistive device: No helper, 6-To toilet/ BSC, 6-From toilet/BSC  FIM - Banker Devices: Walker, Arm rests Bed/Chair Transfer: 5: Bed > Chair or W/C: Supervision (verbal cues/safety issues), 5: Chair or W/C > Bed: Supervision (verbal cues/safety issues)  FIM - Locomotion: Wheelchair Distance: 150 Locomotion: Wheelchair: 1: Total  Assistance/staff pushes wheelchair (Pt<25%) FIM - Locomotion: Ambulation Locomotion: Ambulation Assistive Devices: Designer, industrial/product Ambulation/Gait Assistance: 4: Min guard  Locomotion: Ambulation: 2: Travels 50 - 149 ft with minimal assistance (Pt.>75%)  Comprehension Comprehension Mode: Auditory Comprehension: 5-Understands complex 90% of the time/Cues < 10% of the time  Expression Expression Mode: Verbal Expression: 6-Expresses complex ideas: With extra time/assistive device  Social Interaction Social Interaction: 6-Interacts appropriately with others with medication or extra time (anti-anxiety, antidepressant).  Problem Solving Problem Solving Mode: Not assessed Problem Solving: 4-Solves basic 75 - 89% of the time/requires cueing 10 - 24% of the time  Memory Memory Mode: Not assessed Memory: 4-Recognizes or recalls 75 - 89% of the time/requires cueing 10 - 24% of the time  Medical Problem List and Plan:  1. Functional deficits secondary to traumatic bilateral subdural hematoma status post craniotomies 10/16/2014 2. DVT Prophylaxis/Anticoagulation: Sequential compression devices. No anticoagulation at this time due to SDH 3. Pain Management: Hydrocodone as needed.  -left calcaneal spurs, plantar fasciitis worsened by bedrest and immobility---ice after therapy  -showing some improvement today   -regular stretching---PRAFO ordered,  Needs to continue using   -voltaren gel scheduled   -heel cup as an outpt 4. Seizure prophylaxis. Keppra 500 mg twice a day. Monitor for any seizure activity 5. Neuropsych: This patient is capable of making decisions on his own behalf. 6. Skin/Wound Care: Routine skin checks 7. Fluids/Electrolytes/Nutrition: Strict I&O will follow chemistries 8. Dysphagia. Dysphagia 2 thin liquids.  9. Hypertension. Norvasc 10 mg daily, hydralazine 20 mg twice a day, lisinopril 40 mg daily and hydrochlorothiazide 25 mg daily. Monitor with increased mobility 10.  New onset atrial fibrillation. Troponin negative. Cardiac rate control. Plan aspirin therapy once cleared by neurosurgery 11. Diabetes mellitus with peripheral neuropathy.Glucophage 1000 mg twice a day.    -on NPH insulin at home 4qam, 8qpm---began 4 bid and sugars improving.  12. BPH. Flomax 0.4 mg daily. Check PVR's 3 more times---only emptied 4x yesterday  -ua equivocal and culture only 10k colonies 13.Hyperlipedemia. Pravachol  LOS (Days) 6 A FACE TO FACE EVALUATION WAS PERFORMED  Maryetta Shafer T 10/30/2014 9:03 AM

## 2014-10-30 NOTE — Progress Notes (Signed)
Patient and patient's son given discharge information from Malissa Hippoan A., PA all questions answered. All belongings packed and taken with patient.

## 2014-10-30 NOTE — Plan of Care (Signed)
Problem: RH Bed to Chair Transfers Goal: LTG Patient will perform bed/chair transfers w/assist (PT) LTG: Patient will perform bed/chair transfers with assistance, with/without cues (PT).  Outcome: Not Met (add Reason) Pt continues to require cueing for safe use of rolling walker during functional transfers.

## 2014-10-30 NOTE — Discharge Instructions (Signed)
Inpatient Rehab Discharge Instructions  Mason [name] Discharge date and time: No discharge date for patient encounter.   Activities/Precautions/ Functional Status: Activity: activity as tolerated Diet: diabetic diet Wound Care: none needed Functional status:  ___ No restrictions     ___ Walk up steps independently _x__ 24/7 supervision/assistance   ___ Walk up steps with assistance ___ Intermittent supervision/assistance  ___ Bathe/dress independently ___ Walk with walker     ___ Bathe/dress with assistance ___ Walk Independently    ___ Shower independently _x__ Walk with assistance    ___ Shower with assistance ___ No alcohol     ___ Return to work/school ________  COMMUNITY REFERRALS UPON DISCHARGE:   Home Health:   PT     ST     [facility]   Phone:  [phone]  Medical Equipment/Items Ordered:  16x18 lightweight wheelchair and 16x18 basic cushion  Agency/Supplier:  [facility]       Phone:  [phone]  Special Instructions:    My questions have been answered and I understand these instructions. I will adhere to these goals and the provided educational materials after my discharge from the hospital.  Patient/Caregiver Signature _______________________________ Date __________  Clinician Signature _______________________________________ Date __________  Please bring this form and your medication list with you to all your follow-up doctor's appointments.

## 2014-10-30 NOTE — Discharge Summary (Signed)
NAMMerdis Delay:  Mason Stevens, Mason Stevens          ACCOUNT NO.:  [id number]  MEDICAL RECORD NO.:  [id number]  LOCATION:  4M06C                        FACILITY:  [facility]  PHYSICIAN:  Ranelle OysterZachary T. Swartz, M.D.DATE OF BIRTH:  [birth year]  DATE OF ADMISSION:  10/24/2014 DATE OF DISCHARGE:  10/30/2014                              DISCHARGE SUMMARY   DISCHARGE DIAGNOSES: 1. Functional deficits secondary to traumatic bilateral subdural     hematoma with craniotomy October 16, 2014. 2. Sequential compression devices for deep vein thrombosis     prophylaxis. 3. Pain management.Left calcaneal spurs/plantar fascitis 4. Seizure prophylaxis. 5. Dysphagia. 6. Hypertension. 7. New onset atrial fibrillation. 8. Diabetes mellitus. 9. Peripheral neuropathy. 10.Benign prostatic hypertrophy. 11.Hyperlipidemia.  HISTORY OF PRESENT ILLNESS:  This 81 year old right-handed male with history of hypertension, as well as diabetes mellitus, who was admitted October 16, 2014, with frequent falls and altered mental status. Initially, [facility] to [facility], where cranial CT scan showed large bilateral subdural hematomas.  [facility], underwent bilateral craniotomy, evacuation of subdural hematoma October 16, 2014, per Dr. Venetia MaxonStern.  Placed on Keppra for seizure prophylaxis with followup cranial CT scan October 21, 2014, showing no acute changes.  The patient with new onset atrial fibrillation.  Echocardiogram with ejection fraction of 55%.  No wall motion abnormality.  Troponin was negative.  Plan was to begin aspirin therapy once cleared by Neurosurgery.  He was currently on a dysphagia #2 thin liquid diet.  Physical and occupational therapy ongoing and the patient was admitted for a comprehensive rehab program.  PAST MEDICAL HISTORY:  See discharge diagnoses.  SOCIAL HISTORY:  He lives with his wife.  He was quite medically compromised.  He was independent prior to  admission.  Recently using a cane after a fall.  FUNCTIONAL STATUS:  Upon admission to rehab service with min mod assist, ambulate 90 feet, minimal assist sit-to-stand; min mod assist activities of daily living.  PHYSICAL EXAMINATION:  VITAL SIGNS:  Blood pressure 154/62, pulse 60, temperature 98, respirations 20. GENERAL:  This was an alert, pleasant male, in no acute distress.  Fair awareness of his deficits.  He was alert and followed full commands. NEUROLOGIC:  Craniotomy site was showing no drainage. LUNGS:  Clear to auscultation. CARDIAC:  Rate controlled. ABDOMEN:  Soft, nontender.  Good bowel sounds.  He is moving all extremities 4/5 bilateral upper and lower.  REHABILITATION HOSPITAL COURSE:  The patient was admitted to inpatient rehab services.  Therapies initiated on a 3-hour daily basis consisting of physical therapy, occupational therapy, speech therapy, and rehabilitation nursing.  The following issues were addressed during the patient's rehabilitation stay.  Pertaining to Mason Stevens's traumatic bilateral subdural hematoma, he had undergone craniotomy October 16, 2014, would follow up Neurosurgery, Dr. Venetia MaxonStern.  His cranial CT scan stable.  Sequential compression devices for DVT prophylaxis.  No anticoagulation at this time due to subdural hematoma.  His diet was advanced to a regular consistency.  He tolerated well.  Blood pressures controlled on combination of Norvasc, hydralazine, lisinopril, and hydrochlorothiazide which he would follow up with his primary MD.  There was no orthostatic changes.  New onset atrial fibrillation.  Troponins negative.  Cardiac  rate was controlled.  No aspirin therapy at this time per Neurosurgery until followup as outpatient.  He did have a history of diabetes mellitus.  He continued on Glucophage as advised and underwent full diabetic teaching.  Benign prostatic hypertrophy.  Voiding without difficulty.  He remained on Flomax.   Urinalysis study unremarkable.  The patient received weekly collaborative interdisciplinary team conferences to discuss estimated length of stay, family teaching, and any barriers to discharge.  He was ambulating 75 feet x 2 requiring minimal assist, some cuing for sequencing using his walker.  His son and daughter continued to assist during his family teaching and therapies.  He is quite anxious to get back home to his wife with multiple medical issues. Activities of daily living.  He ambulated to the bathroom rolling walker, supervision.  Steady assist with son for bathroom and toileting. Full family teaching completed and plan was discharged to home October 30, 2014.  DISCHARGE MEDICATIONS:  Included; 1. Norvasc 10 mg p.o. daily. 2. Voltaren gel 4 times daily as needed to affected area. 3. Colace 100 mg p.o. b.i.d. 4. Hydralazine 20 mg p.o. b.i.d. 5. Hydrocodone 1 tablet every 4 hours as needed for pain, dispense of     90 tablets. 6. Keppra 500 mg p.o. b.i.d. 7. Lisinopril 40 mg p.o. daily. 8. Hydrochlorothiazide 25 mg p.o. daily. 9. Glucophage 1000 mg p.o. b.i.d. 10.Multivitamin 1 tablet p.o. daily. 11.Protonix 40 mg p.o. daily. 12.Pravachol 10 mg p.o. daily.  DIET:  Diabetic diet.  He would follow up Dr. Faith Rogue at the Outpatient Rehab Service office as directed; with Sharee Pimple, Nurse Practitioner November 13, 2014; Dr. Maeola Harman call for appointment 2 weeks.  SPECIAL INSTRUCTIONS:  Followup with Neurosurgery, Dr. Venetia Maxon in reference to new onset atrial fibrillation and question and plan when to begin aspirin therapy after subdural hematoma. Heel cup as put patient for plantar fascitis    Mariam Dollar, P.A.   ______________________________ Ranelle Oyster, M.D.    DA/MEDQ  D:  10/29/2014  T:  10/30/2014  Job:  [id number]  cc:   Danae Orleans. Venetia Maxon, M.D. Ranelle Oyster, M.D.

## 2014-10-30 NOTE — Progress Notes (Signed)
Occupational Therapy Discharge Summary  Patient Details  Name: Mason Stevens MRN: [id number] Date of Birth: 27-Mar-1927  Patient has met 51 of 10 long term goals due to improved activity tolerance, improved balance, ability to compensate for deficits, improved awareness and improved coordination.  Pt made steady progress with BADLs and IADLs during this admission.  Pt requires supervision with shower transfers and mod I/I with all other BADLs/IADLs.  Pt continues to experience left heel discomfort with weight bearing through LLE. Pt's son has been present for therapy and communicated that family will be present with patient after discharge.  Patient to discharge at overall Modified Independent level.  Patient's care partner is independent to provide the necessary physical assistance at discharge as needed.      Recommendation:  No further OT services required.  Equipment: No equipment providedPt owns tub bench  Reasons for discharge: treatment goals met and discharge from hospital  Patient/family agrees with progress made and goals achieved: Yes  OT Discharge Pain Pain Assessment Pain Assessment: No/denies pain Pain Score: 0-No pain Pain Type: Chronic pain Pain Location: Heel Pain Orientation: Left Pain Descriptors / Indicators: Aching Pain Onset: On-going Pain Intervention(s): Medication (See eMAR) ADL ADL ADL Comments: Refer to FIM Vision/Perception  Vision- History Baseline Vision/History: Wears glasses;Cataracts Wears Glasses: Reading only Patient Visual Report: No change from baseline Vision- Assessment Vision Assessment?: No apparent visual deficits Perception Comments: WFL  Cognition Overall Cognitive Status: Impaired/Different from baseline Arousal/Alertness: Awake/alert Orientation Level: Oriented X4 Attention: Selective Focused Attention: Appears intact Sustained Attention: Appears intact Selective Attention: Appears intact Memory: Impaired Memory  Impairment: Decreased recall of new information;Decreased short term memory;Retrieval deficit Decreased Short Term Memory: Verbal complex;Functional basic Awareness: Impaired Awareness Impairment: Anticipatory impairment Problem Solving: Impaired Problem Solving Impairment: Functional complex Safety/Judgment: Appears intact Sensation Sensation Light Touch: Appears Intact Stereognosis: Not tested Hot/Cold: Appears Intact Proprioception: Appears Intact Additional Comments: per patient, numbness no longer present in lateral left leg  Coordination Gross Motor Movements are Fluid and Coordinated: Yes Fine Motor Movements are Fluid and Coordinated: Yes Finger Nose Finger Test: wfl B UEs Heel Shin Test: wfl B LEs Motor  Motor Motor: Within Functional Limits Mobility  Bed Mobility Bed Mobility: Right Sidelying to Sit Right Sidelying to Sit: 6: Modified independent (Device/Increase time) Supine to Sit: 6: Modified independent (Device/Increase time) Sitting - Scoot to Edge of Bed: 6: Modified independent (Device/Increase time)  Trunk/Postural Assessment  Cervical Assessment Cervical Assessment: Within Functional Limits Thoracic Assessment Thoracic Assessment: Within Functional Limits Lumbar Assessment Lumbar Assessment: Within Functional Limits Postural Control Postural Control: Within Functional Limits  Balance Static Sitting Balance Static Sitting - Level of Assistance: 7: Independent Dynamic Sitting Balance Dynamic Sitting - Level of Assistance: 7: Independent Extremity/Trunk Assessment RUE Assessment RUE Assessment: Within Functional Limits LUE Assessment LUE Assessment: Within Functional Limits  See FIM for current functional status  Leroy Libman 10/30/2014, 1:33 PM

## 2014-10-30 NOTE — Progress Notes (Signed)
Physical Therapy Discharge Summary  Patient Details  Name: Mason Stevens MRN: [id number] Date of Birth: 08-Feb-1927  Today's Date: 10/30/2014 PT Individual Time: 0930-1030 PT Individual Time Calculation (min): 60 min    Patient has met 8 of 11 long term goals due to improved balance, improved postural control and ability to compensate for deficits.  Patient to discharge at an ambulatory level Supervision.   Patient's care partner is independent to provide the necessary physical and cognitive assistance at discharge.  Reasons goals not met: Pt required cueing for basic transfers, furniture transfers. Floor transfer not assessed on D/C secondary to significant pain in left calcaneus.  Recommendation:  Patient will benefit from ongoing skilled PT services in home health setting to continue to advance safe functional mobility, address ongoing impairments in stability/independence with functional mobility and minimize fall risk.  Equipment: w/c (16"x18") and cushion  Reasons for discharge: treatment goals met and discharge from hospital  Patient/family agrees with progress made and goals achieved: Yes   Skilled Therapeutic Interventions/Progress Updates D/C evaluation completed; see below for detailed findings. Pt educated on goals, findings, progress, and D/C plan. Pt verbalized understanding and was in full agreement with D/C plan. Session ended in pt room, where pt was left seated in w/c with bilat UE's elevated and all needs within reach.  PT Discharge Precautions/Restrictions Precautions Precautions: Fall Restrictions Weight Bearing Restrictions: No Vital Signs  Pain Pain Assessment Pain Assessment: 0-10 Pain Score: 4  Pain Type: Chronic pain Pain Location: Heel Pain Orientation: Left Pain Descriptors / Indicators: Aching Pain Onset: On-going Pain Intervention(s): RN made aware;Cold applied;Elevated extremity;Other (Comment) (pre-treat - stretching) Vision/Perception   Perception Comments: WFL  Cognition Overall Cognitive Status: Impaired/Different from baseline Arousal/Alertness: Awake/alert Orientation Level: Oriented X4 Attention: Selective Focused Attention: Appears intact Sustained Attention: Appears intact Selective Attention: Appears intact Memory: Impaired Memory Impairment: Decreased recall of new information;Decreased short term memory;Retrieval deficit Decreased Short Term Memory: Verbal complex;Functional basic Awareness Impairment: Anticipatory impairment Problem Solving: Impaired Problem Solving Impairment: Functional complex Safety/Judgment: Appears intact Sensation Sensation Light Touch: Appears Intact Stereognosis: Not tested Hot/Cold: Appears Intact Proprioception: Appears Intact Additional Comments: per patient, numbness no longer present in lateral left leg  Coordination Gross Motor Movements are Fluid and Coordinated: Yes Fine Motor Movements are Fluid and Coordinated: Yes Finger Nose Finger Test: wfl B UEs Heel Shin Test: wfl B LEs Motor  Motor Motor: Within Functional Limits  Mobility Bed Mobility Bed Mobility: Supine to Sit;Sit to Supine;Sitting - Scoot to Edge of Bed Right Sidelying to Sit: 6: Modified independent (Device/Increase time);HOB flat Supine to Sit: 6: Modified independent (Device/Increase time);HOB flat Sitting - Scoot to Edge of Bed: 6: Modified independent (Device/Increase time) Sit to Supine: HOB flat Transfers Transfers: Yes Sit to Stand: 5: Supervision;From bed;From chair/3-in-1 Sit to Stand Details: Verbal cues for technique Stand to Sit: To bed;To chair/3-in-1;5: Supervision Stand Pivot Transfers: 5: Supervision;Other (comment) (using rolling walker) Stand Pivot Transfer Details: Verbal cues for precautions/safety Locomotion  Ambulation Ambulation: Yes Ambulation/Gait Assistance: 5: Supervision Ambulation Distance (Feet): 150 Feet Assistive device: Rolling walker Ambulation/Gait  Assistance Details: Verbal cues for safe use of DME/AE;Verbal cues for gait pattern Gait Gait: Yes Gait Pattern: Impaired Gait Pattern: Antalgic;Trunk flexed;Step-through pattern;Decreased step length - right;Decreased stance time - left Stairs / Additional Locomotion Stairs: Yes Stairs Assistance: 4: Min guard Stairs Assistance Details: Verbal cues for sequencing;Verbal cues for precautions/safety Stairs Assistance Details (indicate cue type and reason): forward-facing to ascend, lateral to descend Stair Management Technique: One rail  Right;Step to pattern;Sideways;Forwards Number of Stairs: 12 Curb: Other (comment);5: Supervision (with supervision, manual stabilization of rolling walker, and cueing for technique, sequencing) Wheelchair Mobility Wheelchair Mobility: Yes Wheelchair Assistance: 5: Supervision Wheelchair Propulsion: Both upper extremities Wheelchair Parts Management: Supervision/cueing Distance: 150  Trunk/Postural Assessment  Cervical Assessment Cervical Assessment: Within Functional Limits Thoracic Assessment Thoracic Assessment: Within Functional Limits Lumbar Assessment Lumbar Assessment: Within Functional Limits Postural Control Postural Control: Within Functional Limits  Balance Balance Balance Assessed: Yes (Berg not assessed on D/C secondary to significant pain in L heel) Static Sitting Balance Static Sitting - Level of Assistance: 7: Independent Dynamic Sitting Balance Dynamic Sitting - Level of Assistance: 7: Independent Static Standing Balance Static Standing - Balance Support: Bilateral upper extremity supported;During functional activity Static Standing - Level of Assistance: 6: Modified independent (Device/Increase time) Dynamic Standing Balance Dynamic Standing - Balance Support: During functional activity;Bilateral upper extremity supported Dynamic Standing - Level of Assistance: 5: Stand by assistance Extremity Assessment  RUE Assessment RUE  Assessment: Within Functional Limits LUE Assessment LUE Assessment: Within Functional Limits RLE Assessment RLE Assessment: Within Functional Limits LLE Assessment LLE Assessment: Within Functional Limits  See FIM for current functional status  Mersadie Kavanaugh, Malva Cogan 10/30/2014, 5:02 PM

## 2014-10-30 NOTE — Progress Notes (Signed)
Occupational Therapy Session Note  Patient Details  Name: Mason [name] MRN: [id number] Date of Birth: [birth year]  Today's Date: 10/30/2014 OT Individual Time: 0700-0800 OT Individual Time Calculation (min): 60 min    Short Term Goals: Week 1:   STG=LTG secondary to ELOS less than 7 days  Skilled Therapeutic Interventions/Progress Updates:    Pt engaged in BADL retraining including toilet transfers, toileting, shower transfers, bathing at shower level, and dressing with sit<>stand from EOB.  Pt amb with RW to gather clothing and amb into bathroom for toileting and shower.  Pt completed bathing tasks with sit<>stand from shower chair at mod I level.  Pt amb with RW into room to complete dressing tasks with sit<>stand from EOB.  Pt completed grooming tasks standing at sink.  Pt continues to c/o pain in left heel but believes it will feel better after he returns home.  Pt is pleased with progress and believes he is ready to return home later today.  Pt states that his son will be with him at home.  Focus on activity tolerance, sit<>stand, standing balance, functional amb with RW, functional transfers, and safety awareness.  Therapy Documentation Precautions:  Precautions Precautions: Fall Restrictions Weight Bearing Restrictions: No Pain: Pain Assessment Pain Assessment: 0-10 Pain Score: 3  Pain Type: Acute pain Pain Location: Heel Pain Orientation: Left Pain Descriptors / Indicators: Aching Pain Onset: On-going Pain Intervention(s): RN made aware  See FIM for current functional status  Therapy/Group: Individual Therapy  Rich BraveLanier, Ren [name] 10/30/2014, 8:02 AM

## 2014-10-30 NOTE — Progress Notes (Signed)
Speech Language Pathology Progress Note and Discharge Summary  Patient Details  Name: Mason Stevens MRN: [id number] Date of Birth: 08/12/1927  Today's Date: 10/30/2014 SLP Individual Time: 1100-1145 SLP Individual Time Calculation (min): 45 min  Skilled Therapeutic Interventions:  Skilled treatment session focused on patient education. Student facilitated session by providing education to patient regarding his current cognitive functioning and by providing handouts and strategies for implementation at home to assist his short-term memory and functional problem solving. Patient educated on benefits of implementing and maintaining a new, relevant routine at home, the importance of having supervision at home to be safe in completing ADLs, and the need for supervision and assistance in managing money and medicine. Additionally, use of twice-daily pillbox was recommended to facilitate recall of when to take medicine and overall medicine management. Student attempted x2 to complete education with patient's son; both attempts unsuccessful, however, 2 handouts left with patient. Patient verbalized understanding of all information. Patient will discharge home today with 24 hour supervision from children.  Patient has met 4 of 4 long term goals.  Patient to discharge at overall Modified Independent;Supervision level.   Reasons goals not met: n/a   Clinical Impression/Discharge Summary: Patient has met 4 out of 4 LTG's this reporting period due to increased utilization of external aids, functional problem-solving, and increased intellectual awareness. At this time, patient is Mod I for intellectual awareness of cognitive deficits and requires Supervision A multimodal cues functional problem solving and for use of external aids to aid short-term memory and orientation. Patient education completed and patient will discharge home to receive 24-hour supervision from children. Patient would continue to benefit  from skilled SLP intervention in the home setting to maximize functional independence and increase cognitive functioning so as to decrease caregiver burden.  Care Partner:  Caregiver Able to Provide Assistance: Yes  Type of Caregiver Assistance: Cognitive;Physical  Recommendation:  Home Health SLP;24 hour supervision/assistance  Rationale for SLP Follow Up: Maximize cognitive function and independence;Reduce caregiver burden   Equipment: n/a   Reasons for discharge: Discharged from hospital;Treatment goals met   Patient/Family Agrees with Progress Made and Goals Achieved: Yes   See FIM for current functional status  Servando Snare 10/30/2014, 2:58 PM

## 2014-10-30 NOTE — Plan of Care (Signed)
Problem: RH Furniture Transfers Goal: LTG Patient will perform furniture transfers w/assist (OT/PT LTG: Patient will perform furniture transfers with assistance (OT/PT).  Outcome: Not Met (add Reason) Pt required cueing for sit > stand from couch.

## 2014-10-30 NOTE — Plan of Care (Deleted)
Problem: RH Awareness Goal: LTG: Patient will demonstrate intellectual/emergent (SLP) LTG: Patient will demonstrate intellectual/emergent/anticipatory awareness with assist during a cognitive/linguistic activity (SLP)  Outcome: Not Met (add Reason) Patient at Supervision level for intellectual awareness

## 2014-10-31 NOTE — Patient Care Conference (Signed)
Inpatient RehabilitationTeam Conference and Plan of Care Update Date: 10/28/2014   Time: 1:40 PM    Patient Name: Mason Stevens      Medical Record Number: [id number]  Date of Birth: 10-18-1926 Sex: Male         Room/Bed: 4M06C/4M06C-01 Payor Info: Payor: [employer] MEDICARE / Plan: [employer] MEDICARE HMO/PPO / Product Type: *No Product type* /    Admitting Diagnosis: B SDH  Crani   Admit Date/Time:  10/24/2014  4:50 PM Admission Comments: No comment available   Primary Diagnosis:  Traumatic subdural hemorrhage with loss of consciousness of 31 minutes to 59 minutes Principal Problem: Traumatic subdural hemorrhage with loss of consciousness of 31 minutes to 59 minutes  Patient Active Problem List   Diagnosis Date Noted  . Calcaneal spur of left foot 10/29/2014  . Traumatic subdural hemorrhage with loss of consciousness of 31 minutes to 59 minutes 10/24/2014  . Protein-calorie malnutrition, severe 10/17/2014  . Subdural hematoma 10/16/2014  . Atrial fibrillation 10/16/2014  . Type 2 diabetes mellitus without complication   . Anemia due to other cause     Expected Discharge Date: Expected Discharge Date: 10/31/14 (after tx)  Team Members Present: Physician leading conference: Dr. Faith Rogue Social Worker Present: Staci Acosta, LCSW Nurse Present: Carlean Purl, RN PT Present: Edman Circle, PT;Bridgett Ripa, PT OT Present: Ardis Rowan, Darolyn Rua, OT SLP Present: Feliberto Gottron, SLP PPS Coordinator present : Tora Duck, RN, CRRN     Current Status/Progress Goal Weekly Team Focus  Medical   bilateral SDH s/p crani. moving well  improve activity tolerance  bladder mgt, wound care   Bowel/Bladder   contiinent of bowel and bladder  remain continent of bowel and bladder  monitor for constipation/diarrhea   Swallow/Nutrition/ Hydration             ADL's   min A/supervision overall; delayed righting reactions; decresased safety awareness  mod I overall  activity  tolerance, functional transfers, functional amb with RW for home mgmt tasks, standing balance, education   Mobility   Supervision-min A overall  Mod I-supervision overall  Pain management L foot, balance, transfers and gait for D/C home by end of week   Communication             Safety/Cognition/ Behavioral Observations  Mod A  Min A  mildly complex problem solving, recall of functional information    Pain   patient complains of pain to R heel  pain less than or equal to 3 on a scaale of 0-10  assess pain q4ha nd prn, medicate as indicated   Skin   no current signs of skin injury/breakdown  remain free from skin injuty/breakdown  assess skin q shift and prn    Rehab Goals Patient on target to meet rehab goals: Yes Rehab Goals Revised: pt's goals are set for supervision to allow him to go home sooner. *See Care Plan and progress notes for long and short-term goals.  Barriers to Discharge: safety, balance    Possible Resolutions to Barriers:  NMR, family ed    Discharge Planning/Teaching Needs:  Pt plans to go to his home with his children there for supervision.  Son has been present for therapies.   Team Discussion:  Pt is doing well, but left foot pain limits him.  PT to try a resting splint.  Pt will need a w/c for home use, but no other DME.  HH PT/ST will be set up.  Pt to d/c at supervision level  overall due to limiting foot pain and therapists not being able to progress pt further with the time they are willing to stay on CIR.  Revisions to Treatment Plan:  Pt to d/c 10-30-14 after therapies.   Continued Need for Acute Rehabilitation Level of Care: The patient requires daily medical management by a physician with specialized training in physical medicine and rehabilitation for the following conditions: Daily direction of a multidisciplinary physical rehabilitation program to ensure safe treatment while eliciting the highest outcome that is of practical value to the patient.:  Yes Daily medical management of patient stability for increased activity during participation in an intensive rehabilitation regime.: Yes Daily analysis of laboratory values and/or radiology reports with any subsequent need for medication adjustment of medical intervention for : Neurological problems;Post surgical problems  Manual Navarra, Vista DeckJennifer Capps 10/31/2014, 10:57 AM

## 2014-10-31 NOTE — Progress Notes (Signed)
Social Work Patient ID: Mason Stevens, male   DOB: 08/15/1927, 81 y.o.   MRN: [id number]   CSW met with pt and son on 10-29-14 to update them on team conference discussion.  Pt was appreciative of the care he is receiving here, but he really wants to get home.  He requested leaving on 10-30-14 after therapies instead of 10-31-14, as his family will be there to help him and he wants to get home to his wife who has just entered hospice care at home.  CSW talked with team and MD and it was agreed upon for 10-30-14 after therapies.  CSW will help facilitate arrangements at home.  Pt/son are pleased.

## 2014-10-31 NOTE — Progress Notes (Signed)
Social Work Discharge Note  The overall goal for the admission was met for:   Discharge location: Yes - home with family at his home  Length of Stay: Yes - 6 days  Discharge activity level: Yes - supervision  Home/community participation: Yes  Services provided included: MD, RD, PT, OT, SLP, RN, Pharmacy and [place]: Private Insurance: [place] [employer]  Follow-up services arranged: Home Health: [facility] and DME: [phone] wheelchair with basic cushion  Comments (or additional information):  Pt is going to his home with his children taking shifts to be with him and his wife who was recently put under hospice care.  Patient/Family verbalized understanding of follow-up arrangements: Yes  Individual responsible for coordination of the follow-up plan: pt's son  Confirmed correct DME delivered: Trey Sailors 10/31/2014    Antonyo Hinderer, Silvestre Mesi

## 2014-11-13 ENCOUNTER — Encounter (HOSPITAL_COMMUNITY): Payer: Self-pay | Admitting: Emergency Medicine

## 2014-11-13 ENCOUNTER — Emergency Department (HOSPITAL_COMMUNITY)
Admission: EM | Admit: 2014-11-13 | Discharge: 2014-11-13 | Disposition: A | Discharge status: Home / Self Care | Payer: Medicare HMO | Attending: Emergency Medicine | Admitting: Emergency Medicine

## 2014-11-13 DIAGNOSIS — I739 Peripheral vascular disease, unspecified: Secondary | ICD-10-CM | POA: Diagnosis not present

## 2014-11-13 DIAGNOSIS — M79672 Pain in left foot: Secondary | ICD-10-CM | POA: Diagnosis present

## 2014-11-13 DIAGNOSIS — Z79899 Other long term (current) drug therapy: Secondary | ICD-10-CM | POA: Diagnosis not present

## 2014-11-13 LAB — BASIC METABOLIC PANEL
Anion gap: 10 (ref 5–15)
BUN: 19 mg/dL (ref 6–23)
CALCIUM: 9.3 mg/dL (ref 8.4–10.5)
CO2: 27 mmol/L (ref 19–32)
Chloride: 102 mmol/L (ref 96–112)
Creatinine, Ser: 1.22 mg/dL (ref 0.50–1.35)
GFR calc Af Amer: 61 mL/min — ABNORMAL LOW (ref >90)
GFR calc non Af Amer: 52 mL/min — ABNORMAL LOW (ref >90)
Glucose, Bld: 102 mg/dL — ABNORMAL HIGH (ref 70–99)
POTASSIUM: 3.2 mmol/L — AB (ref 3.5–5.1)
Sodium: 139 mmol/L (ref 135–145)

## 2014-11-13 LAB — CBC WITH DIFFERENTIAL/PLATELET
BASOS PCT: 1 % (ref 0–1)
Basophils Absolute: 0 10*3/uL (ref 0.0–0.1)
Eosinophils Absolute: 0.2 10*3/uL (ref 0.0–0.7)
Eosinophils Relative: 6 % — ABNORMAL HIGH (ref 0–5)
HCT: 37.4 % — ABNORMAL LOW (ref 39.0–52.0)
Hemoglobin: 12.8 g/dL — ABNORMAL LOW (ref 13.0–17.0)
LYMPHS PCT: 39 % (ref 12–46)
Lymphs Abs: 1.6 10*3/uL (ref 0.7–4.0)
MCH: 28.7 pg (ref 26.0–34.0)
MCHC: 34.2 g/dL (ref 30.0–36.0)
MCV: 83.9 fL (ref 78.0–100.0)
MONO ABS: 0.3 10*3/uL (ref 0.1–1.0)
Monocytes Relative: 8 % (ref 3–12)
Neutro Abs: 1.9 10*3/uL (ref 1.7–7.7)
Neutrophils Relative %: 46 % (ref 43–77)
Platelets: 290 10*3/uL (ref 150–400)
RBC: 4.46 MIL/uL (ref 4.22–5.81)
RDW: 13.3 % (ref 11.5–15.5)
WBC: 4.1 10*3/uL (ref 4.0–10.5)

## 2014-11-13 MED ORDER — POTASSIUM CHLORIDE CRYS ER 20 MEQ PO TBCR
40.0000 meq | EXTENDED_RELEASE_TABLET | Freq: Once | ORAL | Status: AC
  Administered 2014-11-13: 40 meq via ORAL
  Filled 2014-11-13: qty 2

## 2014-11-13 NOTE — Discharge Instructions (Signed)
Ankle-Brachial Index Test The Ankle-Brachial index is a test used to find peripheral vascular disease (PVD). PVD is also known as peripheral arterial disease (PAD). PVD is a blocking or hardening of the arteries anywhere within the circulatory system beyond the heart. The cause is cholesterol deposits within the blood vessels (atherosclerosis). These deposits cause arteries to narrow. The delivery of oxygen to tissues is impaired as a result. This can cause muscle pain and fatigue. This is called claudication.  PVD means there may also be build up of cholesterol in the:  Heart, increasing the risk for heart attacks.  Brain, increasing the risk for strokes. This test measures the blood flow in the arms and legs. The test also determines if blood vessels are clogged by cholesterol deposits.  HOW IS THE ANKLE-BRACHIAL INDEX TEST DONE? The test is done while you are lying down and resting. The arm (brachial) and ankle systolic pressure are measured. The measurements are taken two times on both sides. Systolic pressure is the pressure within the arteries when the heart pumps. The highest systolic pressure of the ankle is then divided by the highest arm systolic pressure. The result is the ankle-brachial pressure ratio or ABI. There should be a difference of less than 10 mm Hg. Sometimes this test is repeated after five minutes of exercising on a treadmill.  You may have leg pain during the treadmill portion of the test if you suffer from PAD. If the index number drops after exercise, this may show that PAD is present. Document Released: 08/19/2004 Document Revised: 08/20/2013 Document Reviewed: 10/10/2007 [facility] Patient Information 2015 [employer], [place]. This information is not intended to replace advice given to you by your health care provider. Make sure you discuss any questions you have with your health care provider. Peripheral Vascular Disease Peripheral Vascular Disease (PVD), also called Peripheral  Arterial Disease (PAD), is a circulation problem caused by cholesterol (atherosclerotic plaque) deposits in the arteries. PVD commonly occurs in the lower extremities (legs) but it can occur in other areas of the body, such as your arms. The cholesterol buildup in the arteries reduces blood flow which can cause pain and other serious problems. The presence of PVD can place a person at risk for Coronary Artery Disease (CAD).  CAUSES  Causes of PVD can be many. It is usually associated with more than one risk factor such as:   High Cholesterol.  Smoking.  Diabetes.  Lack of exercise or inactivity.  High blood pressure (hypertension).  Obesity.  Family history. SYMPTOMS   When the lower extremities are affected, patients with PVD may experience:  Leg pain with exertion or physical activity. This is called INTERMITTENT CLAUDICATION. This may present as cramping or numbness with physical activity. The location of the pain is associated with the level of blockage. For example, blockage at the abdominal level (distal abdominal aorta) may result in buttock or hip pain. Lower leg arterial blockage may result in calf pain.  As PVD becomes more severe, pain can develop with less physical activity.  In people with severe PVD, leg pain may occur at rest.  Other PVD signs and symptoms:  Leg numbness or weakness.  Coldness in the affected leg or foot, especially when compared to the other leg.  A change in leg color.  Patients with significant PVD are more prone to ulcers or sores on toes, feet or legs. These may take longer to heal or may reoccur. The ulcers or sores can become infected.  If signs and  symptoms of PVD are ignored, gangrene may occur. This can result in the loss of toes or loss of an entire limb.  Not all leg pain is related to PVD. Other medical conditions can cause leg pain such as:  Blood clots (embolism) or Deep Vein Thrombosis.  Inflammation of the blood vessels  (vasculitis).  Spinal stenosis. DIAGNOSIS  Diagnosis of PVD can involve several different types of tests. These can include:  Pulse Volume Recording Method (PVR). This test is simple, painless and does not involve the use of X-rays. PVR involves measuring and comparing the blood pressure in the arms and legs. An ABI (Ankle-Brachial Index) is calculated. The normal ratio of blood pressures is 1. As this number becomes smaller, it indicates more severe disease.  < 0.95 - indicates significant narrowing in one or more leg vessels.  <0.8 - there will usually be pain in the foot, leg or buttock with exercise.  <0.4 - will usually have pain in the legs at rest.  <0.25 - usually indicates limb threatening PVD.  Doppler detection of pulses in the legs. This test is painless and checks to see if you have a pulses in your legs/feet.  A dye or contrast material (a substance that highlights the blood vessels so they show up on x-ray) may be given to help your caregiver better see the arteries for the following tests. The dye is eliminated from your body by the kidney's. Your caregiver may order blood work to check your kidney function and other laboratory values before the following tests are performed:  Magnetic Resonance Angiography (MRA). An MRA is a picture study of the blood vessels and arteries. The MRA machine uses a large magnet to produce images of the blood vessels.  Computed Tomography Angiography (CTA). A CTA is a specialized x-ray that looks at how the blood flows in your blood vessels. An IV may be inserted into your arm so contrast dye can be injected.  Angiogram. Is a procedure that uses x-rays to look at your blood vessels. This procedure is minimally invasive, meaning a small incision (cut) is made in your groin. A small tube (catheter) is then inserted into the artery of your groin. The catheter is guided to the blood vessel or artery your caregiver wants to examine. Contrast dye is  injected into the catheter. X-rays are then taken of the blood vessel or artery. After the images are obtained, the catheter is taken out. TREATMENT  Treatment of PVD involves many interventions which may include:  Lifestyle changes:  Quitting smoking.  Exercise.  Following a low fat, low cholesterol diet.  Control of diabetes.  Foot care is very important to the PVD patient. Good foot care can help prevent infection.  Medication:  Cholesterol-lowering medicine.  Blood pressure medicine.  Anti-platelet drugs.  Certain medicines may reduce symptoms of Intermittent Claudication.  Interventional/Surgical options:  Angioplasty. An Angioplasty is a procedure that inflates a balloon in the blocked artery. This opens the blocked artery to improve blood flow.  Stent Implant. A wire mesh tube (stent) is placed in the artery. The stent expands and stays in place, allowing the artery to remain open.  Peripheral Bypass Surgery. This is a surgical procedure that reroutes the blood around a blocked artery to help improve blood flow. This type of procedure may be performed if Angioplasty or stent implants are not an option. SEEK IMMEDIATE MEDICAL CARE IF:   You develop pain or numbness in your arms or legs.  Your arm  or leg turns cold, becomes blue in color.  You develop redness, warmth, swelling and pain in your arms or legs. MAKE SURE YOU:   Understand these instructions.  Will watch your condition.  Will get help right away if you are not doing well or get worse. Document Released: 09/22/2004 Document Revised: 11/07/2011 Document Reviewed: 08/19/2008 [facility] Patient Information 2015 [place], [place]. This information is not intended to replace advice given to you by your health care provider. Make sure you discuss any questions you have with your health care provider.

## 2014-11-13 NOTE — ED Provider Notes (Signed)
CSN: [id number]     Arrival date & time 11/13/14  1628 History   First MD Initiated Contact with Patient 11/13/14 2015     Chief Complaint  Patient presents with  . Leg Pain     (Consider location/radiation/quality/duration/timing/severity/associated sxs/prior Treatment) Patient is a 81 y.o. male presenting with leg pain. The history is provided by the patient. No language interpreter was used.  Leg Pain Location:  Foot Associated symptoms: no fever   Associated symptoms comment:  He is here for evaluation of pain and swelling in the left ankle and foot. There has been no known injury. He was a patient in the hospital for surgical treatment of bilateral traumatic subdural hematomas, surgery on the 18th of February. He was discharged home on the 3rd of March. He reports the pain and swelling of the foot and ankle started just after the surgery and has persisted since, the only exception being that the pain stopped earlier today. The swelling continues. He denies numbness, fever, calf pain, SOB or chest pain. He states it was evaluated while in the hospital with a plain film x-ray which was negative.    History reviewed. No pertinent past medical history. Past Surgical History  Procedure Laterality Date  . Craniotomy Bilateral 10/16/2014    Procedure: BILATERALCRANIOTOMY HEMATOMA EVACUATION SUBDURAL;  Surgeon: Maeola Harman, MD;  Location: [facility];  Service: Neurosurgery;  Laterality: Bilateral;  BILATERALCRANIOTOMY HEMATOMA EVACUATION SUBDURAL   History reviewed. No pertinent family history. History  Substance Use Topics  . Smoking status: Not on file  . Smokeless tobacco: Not on file  . Alcohol Use: Not on file    Review of Systems  Constitutional: Negative for fever and chills.  Respiratory: Negative.   Cardiovascular: Negative.   Musculoskeletal:       See HPI  Skin: Positive for color change. Negative for pallor.  Neurological: Negative.  Negative for numbness.       Allergies  Review of patient's allergies indicates no known allergies.  Home Medications   Prior to Admission medications   Medication Sig Start Date End Date Taking? Authorizing Provider  amLODipine (NORVASC) 10 MG tablet Take 1 tablet (10 mg total) by mouth daily. 10/30/14   Mcarthur Rossetti Angiulli, PA-C  diclofenac sodium (VOLTAREN) 1 % GEL Apply 2 g topically 4 (four) times daily as needed (pain). 10/30/14   Mcarthur Rossetti Angiulli, PA-C  hydrALAZINE (APRESOLINE) 10 MG tablet Take 2 tablets (20 mg total) by mouth 2 (two) times daily. 10/30/14   Mcarthur Rossetti Angiulli, PA-C  hydrochlorothiazide (HYDRODIURIL) 25 MG tablet Take 1 tablet (25 mg total) by mouth daily. 10/30/14   Mcarthur Rossetti Angiulli, PA-C  HYDROcodone-acetaminophen (NORCO/VICODIN) 5-325 MG per tablet Take 1 tablet by mouth every 4 (four) hours as needed for moderate pain. 10/30/14   Mcarthur Rossetti Angiulli, PA-C  levETIRAcetam (KEPPRA) 500 MG tablet Take 1 tablet (500 mg total) by mouth 2 (two) times daily. 10/30/14   Mcarthur Rossetti Angiulli, PA-C  lisinopril (PRINIVIL,ZESTRIL) 40 MG tablet Take 1 tablet (40 mg total) by mouth daily. 10/30/14   Mcarthur Rossetti Angiulli, PA-C  lovastatin (MEVACOR) 20 MG tablet Take 2 tablets (40 mg total) by mouth at bedtime. 10/30/14   Mcarthur Rossetti Angiulli, PA-C  metFORMIN (GLUCOPHAGE) 1000 MG tablet Take 1 tablet (1,000 mg total) by mouth 2 (two) times daily with a meal. 10/30/14   Mcarthur Rossetti Angiulli, PA-C  Multiple Vitamin (MULTIVITAMIN WITH MINERALS) TABS tablet Take 1 tablet by mouth daily. 10/30/14  Mcarthur Rossettianiel J Angiulli, PA-C  pantoprazole (PROTONIX) 40 MG tablet Take 1 tablet (40 mg total) by mouth daily at 12 noon. 10/30/14   Mcarthur Rossettianiel J Angiulli, PA-C   BP 139/74 mmHg  Pulse 72  Temp(Src) 98.2 F (36.8 C) (Oral)  Resp 18  SpO2 100% Physical Exam  Constitutional: He is oriented to person, place, and time. He appears well-developed and well-nourished.  Neck: Normal range of motion.  Cardiovascular: Intact distal pulses.   Pulmonary/Chest:  Effort normal.  Musculoskeletal: Normal range of motion.  Left foot red and slightly cool to touch. PT and DP pulses doppler positive. Non-tender. Cap RF slow. Nails intact without obvious infection. FROM all joint. Calf non-tender.   Neurological: He is alert and oriented to person, place, and time.  Skin: Skin is warm and dry.  Psychiatric: He has a normal mood and affect.    ED Course  Procedures (including critical care time) Labs Review Labs Reviewed  CBC WITH DIFFERENTIAL/PLATELET - Abnormal; Notable for the following:    Hemoglobin 12.8 (*)    HCT 37.4 (*)    Eosinophils Relative 6 (*)    All other components within normal limits  BASIC METABOLIC PANEL - Abnormal; Notable for the following:    Potassium 3.2 (*)    Glucose, Bld 102 (*)    GFR calc non Af Amer 52 (*)    GFR calc Af Amer 61 (*)    All other components within normal limits    Imaging Review No results found.   EKG Interpretation None     Results for orders placed or performed during the hospital encounter of 11/13/14  CBC with Differential  Result Value Ref Range   WBC 4.1 4.0 - 10.5 K/uL   RBC 4.46 4.22 - 5.81 MIL/uL   Hemoglobin 12.8 (L) 13.0 - 17.0 g/dL   HCT 16.137.4 (L) 09.639.0 - 04.552.0 %   MCV 83.9 78.0 - 100.0 fL   MCH 28.7 26.0 - 34.0 pg   MCHC 34.2 30.0 - 36.0 g/dL   RDW 40.913.3 81.111.5 - 91.415.5 %   Platelets 290 150 - 400 K/uL   Neutrophils Relative % 46 43 - 77 %   Neutro Abs 1.9 1.7 - 7.7 K/uL   Lymphocytes Relative 39 12 - 46 %   Lymphs Abs 1.6 0.7 - 4.0 K/uL   Monocytes Relative 8 3 - 12 %   Monocytes Absolute 0.3 0.1 - 1.0 K/uL   Eosinophils Relative 6 (H) 0 - 5 %   Eosinophils Absolute 0.2 0.0 - 0.7 K/uL   Basophils Relative 1 0 - 1 %   Basophils Absolute 0.0 0.0 - 0.1 K/uL  Basic metabolic panel  Result Value Ref Range   Sodium 139 135 - 145 mmol/L   Potassium 3.2 (L) 3.5 - 5.1 mmol/L   Chloride 102 96 - 112 mmol/L   CO2 27 19 - 32 mmol/L   Glucose, Bld 102 (H) 70 - 99 mg/dL   BUN 19 6  - 23 mg/dL   Creatinine, Ser 7.821.22 0.50 - 1.35 mg/dL   Calcium 9.3 8.4 - 95.610.5 mg/dL   GFR calc non Af Amer 52 (L) >90 mL/min   GFR calc Af Amer 61 (L) >90 mL/min   Anion gap 10 5 - 15    MDM   Final diagnoses:  None    1. Peripheral vascular disease  ABI indicates mild to moderate vascular disease. No concern for DVT - calf non-tender, symptoms of greater than 3  weeks duration. Dr. Effie Shy has seen and evaluated the patient. Feel he is appropriate for discharge home with PCP follow up.    Elpidio Anis, PA-C 11/13/14 2236  Mancel Bale, MD 11/14/14 [phone]

## 2014-11-13 NOTE — ED Notes (Addendum)
Left Brachial SBP: 152 Left Ankle SBP: 96  ABI: 0.63

## 2014-11-13 NOTE — ED Notes (Signed)
Pt here for left leg pain and possible circulation issue per pt; CMS intact; pt sts had recent brain sx

## 2014-11-13 NOTE — ED Notes (Signed)
Pulses found by doppler in both feet.

## 2014-11-24 ENCOUNTER — Ambulatory Visit: Payer: Self-pay | Admitting: Vascular Surgery

## 2014-11-24 LAB — CREATININE, SERUM
Creatinine: 0.97 mg/dL
EGFR (Non-African Amer.): >60

## 2014-11-24 LAB — BUN: BUN: 23 mg/dL — AB

## 2014-11-26 ENCOUNTER — Other Ambulatory Visit: Payer: Self-pay | Admitting: Neurosurgery

## 2014-11-26 DIAGNOSIS — S065X9A Traumatic subdural hemorrhage with loss of consciousness of unspecified duration, initial encounter: Secondary | ICD-10-CM

## 2014-11-26 DIAGNOSIS — S065XAA Traumatic subdural hemorrhage with loss of consciousness status unknown, initial encounter: Secondary | ICD-10-CM

## 2014-12-01 ENCOUNTER — Other Ambulatory Visit: Payer: Medicare HMO

## 2014-12-01 ENCOUNTER — Ambulatory Visit
Admission: RE | Admit: 2014-12-01 | Discharge: 2014-12-01 | Disposition: A | Discharge status: Home / Self Care | Payer: Medicare HMO | Source: Ambulatory Visit | Attending: Neurosurgery | Admitting: Neurosurgery

## 2014-12-01 DIAGNOSIS — S065XAA Traumatic subdural hemorrhage with loss of consciousness status unknown, initial encounter: Secondary | ICD-10-CM

## 2014-12-01 DIAGNOSIS — S065X9A Traumatic subdural hemorrhage with loss of consciousness of unspecified duration, initial encounter: Secondary | ICD-10-CM

## 2014-12-10 ENCOUNTER — Other Ambulatory Visit: Payer: Self-pay | Admitting: Neurosurgery

## 2014-12-10 DIAGNOSIS — S065X9A Traumatic subdural hemorrhage with loss of consciousness of unspecified duration, initial encounter: Secondary | ICD-10-CM

## 2014-12-10 DIAGNOSIS — S065XAA Traumatic subdural hemorrhage with loss of consciousness status unknown, initial encounter: Secondary | ICD-10-CM

## 2014-12-12 ENCOUNTER — Encounter: Payer: Medicare HMO | Admitting: Physical Medicine & Rehabilitation

## 2014-12-15 ENCOUNTER — Ambulatory Visit
Admit: 2014-12-15 | Disposition: A | Discharge status: Home / Self Care | Payer: Self-pay | Attending: Vascular Surgery | Admitting: Vascular Surgery

## 2014-12-15 LAB — BUN: BUN: 15 mg/dL

## 2014-12-15 LAB — CREATININE, SERUM
CREATININE: 1.03 mg/dL
EGFR (African American): >60
EGFR (Non-African Amer.): >60

## 2014-12-28 NOTE — Op Note (Signed)
PATIENT NAME:  Mason Stevens, Mason MR#:  [id number] DATE OF BIRTH:  10-06-1926  DATE OF PROCEDURE:  11/24/2014  PREOPERATIVE DIAGNOSES:  1.  Peripheral arterial disease with rest pain in the left lower extremity.  2.  Suspected embolus to left lower extremity after hospitalization for subdural hematoma 5 weeks ago.  3.  Recent subdural hematoma.  4.  Atrial fibrillation.  5.  Hypertension.  6.  Diabetes.   POSTOPERATIVE DIAGNOSES:  1.  Peripheral arterial disease with rest pain in the left lower extremity.  2.  Suspected embolus to left lower extremity after hospitalization for subdural hematoma 5 weeks ago.  3.  Recent subdural hematoma.  4.  Atrial fibrillation.  5.  Hypertension.  6.  Diabetes.   PROCEDURES:  1.  Ultrasound guidance for vascular access to right femoral artery.  2.  Catheter placement to left peroneal artery and left posterior tibial artery from right femoral approach.  3.  Aortogram and selective left lower extremity angiogram.  4.  Mechanical rheolytic thrombectomy to left popliteal artery, left tibioperoneal trunk, left posterior tibial artery, and left peroneal artery with AngioJet Omni catheter.  5.  Percutaneous transluminal angioplasty of left popliteal artery with 5 mm diameter drug-coated angioplasty balloon.  6.  Percutaneous transluminal angioplasty of left peroneal artery and tibioperoneal trunk with 3 mm diameter angioplasty balloon.  7.  Percutaneous transluminal angioplasty of left posterior tibial artery in the foot and ankle with 2 mm diameter angioplasty balloon.  8.  Percutaneous transluminal angioplasty of left posterior tibial artery up through the tibioperoneal trunk from the ankle of the posterior tibial artery up through the tibioperoneal trunk with a 3 mm diameter angioplasty balloon.   9.  StarClose closure device, right femoral artery.   SURGEON: Annice NeedyJason S. Virgil Slinger, MD   ANESTHESIA: Local with moderate conscious sedation.   ESTIMATED BLOOD  LOSS: 25 mL.   FLUOROSCOPY TIME: 8-1/2 minutes and 95 mL of contrast were used.   INDICATION FOR PROCEDURE: This is an 81 year old gentleman who presented to my office last week with ischemic rest pain in the left foot. This started somewhat abruptly about 5 weeks ago with a subdural hematoma hospitalization. His foot was clearly ischemic and pulseless. He was brought in for angiography for further evaluation and potential treatment. Risks and benefits were discussed. Informed consent was obtained.   DESCRIPTION OF THE PROCEDURE: The patient was brought to the vascular suite. Groins were shaved and prepped and a sterile surgical field was created. The right femoral head was localized with fluoroscopy. Ultrasound was used to visualize a patent right femoral artery. It was then accessed under direct ultrasound guidance without difficulty with a Seldinger needle. J-wire and 5-French sheath were then placed. Pigtail catheter was placed in the aorta at the L1-L2 level and AP aortogram was performed. This showed normal aorta and iliac segments without significant stenosis. The renal arteries and the visceral mesenteric vessels appeared widely patent. I then crossed the aortic bifurcation and advanced to the left femoral head. Selective left lower extremity angiogram was then performed. This showed a very slow circulation time, but his common femoral artery, profunda femoris artery were widely patent. The superficial femoral artery had a couple of areas of minimal stenosis of less than 25%. [facility] was about a 25% to 30% stenosis; however, at the level of the knee the popliteal artery was occluded abruptly, suspicious for an embolus. On initial imaging it was very difficult to see any distal opacification, although the peroneal  artery and posterior tibial arteries appeared to opacify somewhat distally in the lower calf. The patient was given a smaller dose of intravenous heparin due to his recent  subdural hematoma. I used 3000 units. I elected not to use t-PA due to his recent subdural hematoma, as well, but did decide to use mechanical rheolytic thrombectomy. Wire crossed this easily and reconstituted in the tibioperoneal trunk and then into the posterior tibial artery. I confirmed intraluminal flow at the ankle with a vessel that appeared to have thrombus in it. I replaced an 0.018 wire. I then performed mechanical rheolytic thrombectomy throughout the popliteal artery, the tibioperoneal trunk, and the posterior tibial artery down to the distal leg just above the ankle with a 120 cm Omni catheter. I did not use t-PA due to his recent head bleed. I used a 2 mm diameter angioplasty balloon in the foot and ankle area for the posterior tibial artery. I then took a 3 mm diameter angioplasty balloon from just above the ankle up to the tibioperoneal trunk into the popliteal artery. The popliteal artery, I then used a 5 mm diameter drug-coated angioplasty balloon to treat the thrombosis and occlusion in the popliteal artery. This resulted in significantly improved flow in the posterior tibial artery and popliteal artery. I did reballoon the mid to distal posterior tibial artery with a 3 mm balloon again for residual thrombus, with improvement in flow. About 133 mL of effluent was returned initially through the popliteal, tibioperoneal trunk, and posterior tibial arteries with the AngioJet catheter. I then turned my attention to the peroneal artery, which also had thrombus in it. I was able to cross this and get all the way to the ankle. I then ran the AngioJet mechanical rheolytic thrombectomy through the peroneal artery for about another 55 to 60 mL of effluent return. I then ballooned the peroneal artery with a 3 mm balloon from the distal peroneal artery up to the tibioperoneal trunk. Completion angiogram showed significantly improved flow with mild to moderate residual thrombosis in the tibial vessels  distally. Without being able to give any further t-PA or Aggrastat, I felt this was all we could do from a percutaneous standpoint and elected to terminate the procedure. The residual stenosis in the popliteal artery appeared to be about 20% and non-flow-limiting The sheath was removed. StarClose closure device was deployed in the usual fashion with excellent hemostatic result.   ____________________________ Annice Needy, MD jsd:ST D: 11/24/2014 13:13:48 ET T: 11/25/2014 02:06:44 ET JOB#: [id number]  cc: Annice Needy, MD, <Dictator> Riley Lam. Chelminski, MD Annice Needy MD ELECTRONICALLY SIGNED 12/01/2014 15:49

## 2014-12-28 NOTE — Op Note (Signed)
PATIENT NAME:  Mason Stevens, Mason Stevens MR#:  [id number] DATE OF BIRTH:  [birth year]  DATE OF PROCEDURE:  12/15/2014  PREOPERATIVE DIAGNOSES: 1.  Peripheral arterial disease with rest pain, left lower extremity.  2.  Embolus to left lower extremity.  3.  Subdural hematoma approximately two months ago.  4.  Atrial fibrillation.  5.  Diabetes.  6.  Hypertension.   POSTOPERATIVE DIAGNOSES:  1.  Peripheral arterial disease with rest pain, left lower extremity.  2.  Embolus to left lower extremity.  3.  Subdural hematoma approximately two months ago.  4.  Atrial fibrillation.  5.  Diabetes.  6.  Hypertension.   PROCEDURES:  1.  Ultrasound guidance for vascular access to right femoral artery.  2.  Catheter placement into left anterior tibial artery, left peroneal artery, and left posterior tibial arteries from right femoral approach.  3.  Left lower extremity angiogram.  4.  Percutaneous transluminal angioplasty of the left peroneal artery with 2.5 mm diameter angioplasty balloon.  5.  Percutaneous transluminal angioplasty of left anterior tibial artery with 2.5 mm diameter distally in the dorsalis pedis and distal anterior tibial artery and a 3 mm diameter angioplasty balloon throughout the remainder of the anterior tibial artery.  6.  Percutaneous transluminal angioplasty of left posterior tibial artery throughout its entire length into the foot.  7.  StarClose closure device right femoral artery.   SURGEON:  Annice Needy, M.D.   ANESTHESIA:  Local with moderate conscious sedation.   ESTIMATED BLOOD LOSS:  Minimal.   FLUOROSCOPY TIME:  Approximately 13 minutes and 70 mL contrast used.    INDICATION FOR PROCEDURE:  An 81 year old gentleman with left lower extremity rest pain.  He had an intervention about a month ago, but he was only about five weeks after a subdural hematoma.  At that time he had been found to have embolization to the left lower extremity on top of native disease.  He had all  three tibial vessels and the popliteal artery treated at that time.  He was not able to be put on anticoagulants due to his recent subdural hematoma, but that was over a month ago and he is now past six weeks beyond the subdural hematoma.  He is brought in for another attempt at intervention and potentially anticoagulation following.   The angiogram was performed for further diagnostic evaluation as well as possible treatment.  Risks and benefits were discussed.  Informed consent was obtained.   DESCRIPTION OF PROCEDURE: The patient is brought to the vascular suite.  Groins were shaved and prepped and a sterile surgical field was created. The right femoral head was localized with fluoroscopy.  The right femoral artery is visualized with ultrasound.  Due to poor anatomic landmarks and recent access on the right femoral artery, it was accessed under direct ultrasound guidance without difficulty with a Seldinger needle and a permanent image was recorded.  A 5 French sheath was placed as we had had a recent aortogram.  I did not feel like repeating this was likely of benefit as all his disease was from the popliteal down before.  I just used a rim catheter and crossed the aortic bifurcation and advanced into the left superficial femoral artery.  The common femoral artery, profunda femoris artery, proximal superficial femoral artery were all widely patent.  The popliteal artery had mild irregularity, but was not occluded.  What the original images showed were occlusion of all three tibial vessels.  The anterior tibial artery occluded after  about 5 cm, the peroneal artery occluded in its mid segment and had reasonable size collaterals downstream, the posterior tibial arteries occluded in the mid to distal vessel as well.  I heparinized the patient and placed a 6-French Ansel sheath over a Terumo advantage wire.  For further evaluation I selectively catheterized each the anterior tibial artery, the peroneal artery, and  the posterior tibial artery with posterior tibial artery being a little more difficult due to flush occlusion.   Imaging was taken of all three vessels.  There was some distal reconstitution so I felt it was reasonable to try to treat all three vessels.  After selectively imaging all three vessels, I made the determination to try to treat them.  I initially got across the peroneal occlusion.  With a each vessel, it should be noted the catheter was removed and wire placed, and then the catheter replaced.  With the peroneal vessel. I used a NaviCross catheter, which was the catheter used to cannulate all three vessels, and the Terumo advantage wire, I was able to gain intraluminal flow down into the peroneal bifurcation just above the ankle and then I placed an 0.018 wire.  A 2.5 mm diameter angioplasty balloon was inflated from the distal peroneal artery up to the tibioperoneal trunk resultant in restoration of patency of the peroneal artery and significantly improved flow.    I then turned my attention to the anterior tibial artery.  The NaviCross catheter and the advantage wire were used to get through the occlusion all the way into the dorsalis pedis artery in the foot. Wire was advanced into the digital artery.  The balloon was taken down to almost the metatarsal phalangeal joint in the mid to distal foot.  I used a 2.5 mm diameter angioplasty balloon for the foot, ankle and lower anterior tibial artery, then I selected a 3 mm diameter x 30 cm length angioplasty balloon from the origin of the anterior tibial artery down to just above the ankle.   Completion angiogram following this with the catheter in the anterior tibial artery showed restoration of flow.  There was some nonocclusive thrombus that remained, but the vessel patency had been restored and was improved.  I then crossed the posterior tibial artery occlusion with a mild amount of difficulty with the NaviCross catheter and an 0.018 wire and  confirmed intraluminal flow in the foot.  I then replaced the 0.018 wire and treated this from just below the ankle up through the origin of the posterior tibial artery and the tibioperoneal trunk with a 3 mm diameter angioplasty balloon.  Despite intervention, this vessel had only partial restoration of patency.  At this point, we thought I had treated all three vessels as best possible and would rely on anticoagulation to try to avoid rethrombosis of the tibials.  The patient tolerated the procedure well.  I performed StarClose closure device to the right femoral artery after removing the sheath. He was taken to the recovery room in stable condition.     ____________________________ Annice NeedyJason S. Jahbari Repinski, MD [id number] D: 12/15/2014 14:20:27 ET T: 12/15/2014 17:48:18 ET JOB#: [id number]  cc: Annice NeedyJason S. Elber Galyean, MD, <Dictator> Annice NeedyJASON S Esperansa Sarabia MD ELECTRONICALLY SIGNED 12/24/2014 13:51

## 2015-01-07 ENCOUNTER — Ambulatory Visit
Admission: RE | Admit: 2015-01-07 | Discharge: 2015-01-07 | Disposition: A | Discharge status: Home / Self Care | Payer: Medicare HMO | Source: Ambulatory Visit | Attending: Neurosurgery | Admitting: Neurosurgery

## 2015-01-07 DIAGNOSIS — S065X9A Traumatic subdural hemorrhage with loss of consciousness of unspecified duration, initial encounter: Secondary | ICD-10-CM

## 2015-01-07 DIAGNOSIS — S065XAA Traumatic subdural hemorrhage with loss of consciousness status unknown, initial encounter: Secondary | ICD-10-CM

## 2016-03-11 ENCOUNTER — Emergency Department: Payer: Medicare HMO

## 2016-03-11 ENCOUNTER — Inpatient Hospital Stay (HOSPITAL_COMMUNITY): Payer: Medicare HMO

## 2016-03-11 ENCOUNTER — Encounter: Payer: Self-pay | Admitting: Emergency Medicine

## 2016-03-11 ENCOUNTER — Inpatient Hospital Stay (HOSPITAL_COMMUNITY): Payer: Medicare HMO | Admitting: Anesthesiology

## 2016-03-11 ENCOUNTER — Encounter (HOSPITAL_COMMUNITY): Payer: Self-pay | Admitting: Radiology

## 2016-03-11 ENCOUNTER — Emergency Department
Admission: EM | Admit: 2016-03-11 | Discharge: 2016-03-11 | Disposition: A | Discharge status: Other Acute Inpatient Hospital | Payer: Medicare HMO | Attending: Emergency Medicine | Admitting: Emergency Medicine

## 2016-03-11 ENCOUNTER — Encounter (HOSPITAL_COMMUNITY): Admission: AD | Disposition: E | Payer: Self-pay | Source: Other Acute Inpatient Hospital | Attending: Internal Medicine

## 2016-03-11 ENCOUNTER — Inpatient Hospital Stay (HOSPITAL_COMMUNITY)
Admission: AD | Admit: 2016-03-11 | Discharge: 2016-03-29 | DRG: 242 | Disposition: E | Payer: Medicare HMO | Source: Other Acute Inpatient Hospital | Attending: Internal Medicine | Admitting: Internal Medicine

## 2016-03-11 DIAGNOSIS — E46 Unspecified protein-calorie malnutrition: Secondary | ICD-10-CM | POA: Diagnosis present

## 2016-03-11 DIAGNOSIS — I10 Essential (primary) hypertension: Secondary | ICD-10-CM | POA: Insufficient documentation

## 2016-03-11 DIAGNOSIS — Z87891 Personal history of nicotine dependence: Secondary | ICD-10-CM

## 2016-03-11 DIAGNOSIS — R791 Abnormal coagulation profile: Secondary | ICD-10-CM | POA: Insufficient documentation

## 2016-03-11 DIAGNOSIS — E785 Hyperlipidemia, unspecified: Secondary | ICD-10-CM | POA: Diagnosis present

## 2016-03-11 DIAGNOSIS — Z950 Presence of cardiac pacemaker: Secondary | ICD-10-CM | POA: Diagnosis not present

## 2016-03-11 DIAGNOSIS — G8191 Hemiplegia, unspecified affecting right dominant side: Secondary | ICD-10-CM | POA: Diagnosis not present

## 2016-03-11 DIAGNOSIS — I639 Cerebral infarction, unspecified: Secondary | ICD-10-CM | POA: Insufficient documentation

## 2016-03-11 DIAGNOSIS — E1142 Type 2 diabetes mellitus with diabetic polyneuropathy: Secondary | ICD-10-CM | POA: Diagnosis present

## 2016-03-11 DIAGNOSIS — I482 Chronic atrial fibrillation: Secondary | ICD-10-CM | POA: Diagnosis present

## 2016-03-11 DIAGNOSIS — R001 Bradycardia, unspecified: Secondary | ICD-10-CM | POA: Diagnosis present

## 2016-03-11 DIAGNOSIS — Z7984 Long term (current) use of oral hypoglycemic drugs: Secondary | ICD-10-CM | POA: Diagnosis not present

## 2016-03-11 DIAGNOSIS — G936 Cerebral edema: Secondary | ICD-10-CM | POA: Insufficient documentation

## 2016-03-11 DIAGNOSIS — J9600 Acute respiratory failure, unspecified whether with hypoxia or hypercapnia: Secondary | ICD-10-CM | POA: Diagnosis not present

## 2016-03-11 DIAGNOSIS — I959 Hypotension, unspecified: Secondary | ICD-10-CM | POA: Diagnosis not present

## 2016-03-11 DIAGNOSIS — Z515 Encounter for palliative care: Secondary | ICD-10-CM | POA: Diagnosis not present

## 2016-03-11 DIAGNOSIS — I493 Ventricular premature depolarization: Secondary | ICD-10-CM | POA: Diagnosis present

## 2016-03-11 DIAGNOSIS — I6340 Cerebral infarction due to embolism of unspecified cerebral artery: Secondary | ICD-10-CM | POA: Diagnosis not present

## 2016-03-11 DIAGNOSIS — E119 Type 2 diabetes mellitus without complications: Secondary | ICD-10-CM | POA: Diagnosis not present

## 2016-03-11 DIAGNOSIS — Z66 Do not resuscitate: Secondary | ICD-10-CM | POA: Diagnosis not present

## 2016-03-11 DIAGNOSIS — E873 Alkalosis: Secondary | ICD-10-CM | POA: Diagnosis not present

## 2016-03-11 DIAGNOSIS — I63312 Cerebral infarction due to thrombosis of left middle cerebral artery: Secondary | ICD-10-CM | POA: Diagnosis not present

## 2016-03-11 DIAGNOSIS — N400 Enlarged prostate without lower urinary tract symptoms: Secondary | ICD-10-CM | POA: Diagnosis present

## 2016-03-11 DIAGNOSIS — Z6823 Body mass index (BMI) 23.0-23.9, adult: Secondary | ICD-10-CM | POA: Diagnosis not present

## 2016-03-11 DIAGNOSIS — E876 Hypokalemia: Secondary | ICD-10-CM | POA: Diagnosis present

## 2016-03-11 DIAGNOSIS — N179 Acute kidney failure, unspecified: Secondary | ICD-10-CM | POA: Diagnosis present

## 2016-03-11 DIAGNOSIS — R4701 Aphasia: Secondary | ICD-10-CM | POA: Diagnosis not present

## 2016-03-11 DIAGNOSIS — J9601 Acute respiratory failure with hypoxia: Secondary | ICD-10-CM | POA: Diagnosis not present

## 2016-03-11 DIAGNOSIS — D649 Anemia, unspecified: Secondary | ICD-10-CM | POA: Diagnosis present

## 2016-03-11 DIAGNOSIS — Z9911 Dependence on respirator [ventilator] status: Secondary | ICD-10-CM

## 2016-03-11 DIAGNOSIS — G9340 Encephalopathy, unspecified: Secondary | ICD-10-CM | POA: Diagnosis not present

## 2016-03-11 DIAGNOSIS — Z79899 Other long term (current) drug therapy: Secondary | ICD-10-CM

## 2016-03-11 DIAGNOSIS — J690 Pneumonitis due to inhalation of food and vomit: Secondary | ICD-10-CM | POA: Diagnosis not present

## 2016-03-11 DIAGNOSIS — I481 Persistent atrial fibrillation: Secondary | ICD-10-CM | POA: Diagnosis present

## 2016-03-11 DIAGNOSIS — I442 Atrioventricular block, complete: Secondary | ICD-10-CM | POA: Diagnosis present

## 2016-03-11 DIAGNOSIS — Z7982 Long term (current) use of aspirin: Secondary | ICD-10-CM | POA: Diagnosis not present

## 2016-03-11 DIAGNOSIS — Q251 Coarctation of aorta: Secondary | ICD-10-CM

## 2016-03-11 HISTORY — PX: RADIOLOGY WITH ANESTHESIA: SHX6223

## 2016-03-11 HISTORY — PX: EP IMPLANTABLE DEVICE: SHX172B

## 2016-03-11 HISTORY — DX: Type 2 diabetes mellitus without complications: E11.9

## 2016-03-11 HISTORY — DX: Essential (primary) hypertension: I10

## 2016-03-11 LAB — POCT I-STAT 7, (LYTES, BLD GAS, ICA,H+H)
ACID-BASE DEFICIT: 6 mmol/L — AB (ref 0.0–2.0)
Acid-base deficit: 10 mmol/L — ABNORMAL HIGH (ref 0.0–2.0)
BICARBONATE: 15.8 meq/L — AB (ref 20.0–24.0)
BICARBONATE: 18.3 meq/L — AB (ref 20.0–24.0)
CALCIUM ION: 1.18 mmol/L (ref 1.12–1.23)
CALCIUM ION: 1.09 mmol/L — AB (ref 1.12–1.23)
HCT: 35 % — ABNORMAL LOW (ref 39.0–52.0)
HEMATOCRIT: 34 % — AB (ref 39.0–52.0)
Hemoglobin: 11.9 g/dL — ABNORMAL LOW (ref 13.0–17.0)
Hemoglobin: 11.6 g/dL — ABNORMAL LOW (ref 13.0–17.0)
O2 SAT: 91 %
O2 Saturation: 92 %
PH ART: 7.357 (ref 7.350–7.450)
Patient temperature: 36
Potassium: 4.1 mmol/L (ref 3.5–5.1)
Potassium: 4 mmol/L (ref 3.5–5.1)
SODIUM: 141 mmol/L (ref 135–145)
SODIUM: 142 mmol/L (ref 135–145)
TCO2: 17 mmol/L (ref 0–100)
TCO2: 19 mmol/L (ref 0–100)
pCO2 arterial: 33.8 mmHg — ABNORMAL LOW (ref 35.0–45.0)
pCO2 arterial: 32.5 mmHg — ABNORMAL LOW (ref 35.0–45.0)
pH, Arterial: 7.273 — ABNORMAL LOW (ref 7.350–7.450)
pO2, Arterial: 64 mmHg — ABNORMAL LOW (ref 80.0–100.0)
pO2, Arterial: 66 mmHg — ABNORMAL LOW (ref 80.0–100.0)

## 2016-03-11 LAB — COMPREHENSIVE METABOLIC PANEL
ALK PHOS: 60 U/L (ref 38–126)
ALT: 16 U/L — AB (ref 17–63)
AST: 30 U/L (ref 15–41)
Albumin: 3.8 g/dL (ref 3.5–5.0)
Anion gap: 12 (ref 5–15)
BILIRUBIN TOTAL: 0.8 mg/dL (ref 0.3–1.2)
BUN: 31 mg/dL — AB (ref 6–20)
CO2: 19 mmol/L — ABNORMAL LOW (ref 22–32)
CREATININE: 1.33 mg/dL — AB (ref 0.61–1.24)
Calcium: 9.3 mg/dL (ref 8.9–10.3)
Chloride: 107 mmol/L (ref 101–111)
GFR calc Af Amer: 54 mL/min — ABNORMAL LOW (ref >60)
GFR calc non Af Amer: 47 mL/min — ABNORMAL LOW (ref >60)
Glucose, Bld: 143 mg/dL — ABNORMAL HIGH (ref 65–99)
Potassium: 3.9 mmol/L (ref 3.5–5.1)
Sodium: 138 mmol/L (ref 135–145)
TOTAL PROTEIN: 6.8 g/dL (ref 6.5–8.1)

## 2016-03-11 LAB — CBC
HEMATOCRIT: 39.2 % — AB (ref 40.0–52.0)
Hemoglobin: 13 g/dL (ref 13.0–18.0)
MCH: 29 pg (ref 26.0–34.0)
MCHC: 33.1 g/dL (ref 32.0–36.0)
MCV: 87.6 fL (ref 80.0–100.0)
Platelets: 209 10*3/uL (ref 150–440)
RBC: 4.47 MIL/uL (ref 4.40–5.90)
RDW: 16.9 % — AB (ref 11.5–14.5)
WBC: 8.8 10*3/uL (ref 3.8–10.6)

## 2016-03-11 LAB — POCT I-STAT 3, ART BLOOD GAS (G3+)
ACID-BASE DEFICIT: 8 mmol/L — AB (ref 0.0–2.0)
Bicarbonate: 17.5 mEq/L — ABNORMAL LOW (ref 20.0–24.0)
O2 SAT: 77 %
PCO2 ART: 34.6 mmHg — AB (ref 35.0–45.0)
PH ART: 7.309 — AB (ref 7.350–7.450)
PO2 ART: 44 mmHg — AB (ref 80.0–100.0)
Patient temperature: 97.6
TCO2: 19 mmol/L (ref 0–100)

## 2016-03-11 LAB — GLUCOSE, CAPILLARY
GLUCOSE-CAPILLARY: 168 mg/dL — AB (ref 65–99)
GLUCOSE-CAPILLARY: 139 mg/dL — AB (ref 65–99)
Glucose-Capillary: 189 mg/dL — ABNORMAL HIGH (ref 65–99)

## 2016-03-11 LAB — MRSA PCR SCREENING: MRSA by PCR: NEGATIVE

## 2016-03-11 LAB — POCT I-STAT 4, (NA,K, GLUC, HGB,HCT)
GLUCOSE: 165 mg/dL — AB (ref 65–99)
HEMATOCRIT: 31 % — AB (ref 39.0–52.0)
Hemoglobin: 10.5 g/dL — ABNORMAL LOW (ref 13.0–17.0)
Potassium: 4.1 mmol/L (ref 3.5–5.1)
Sodium: 144 mmol/L (ref 135–145)

## 2016-03-11 LAB — PROCALCITONIN: Procalcitonin: 0.39 ng/mL

## 2016-03-11 LAB — PROTIME-INR
INR: 0.99
Prothrombin Time: 13.3 seconds (ref 11.4–15.0)

## 2016-03-11 LAB — TRIGLYCERIDES: Triglycerides: 100 mg/dL (ref <150)

## 2016-03-11 LAB — APTT: aPTT: 33 seconds (ref 24–36)

## 2016-03-11 LAB — TROPONIN I: Troponin I: 0.09 ng/mL (ref <0.03)

## 2016-03-11 SURGERY — PACEMAKER IMPLANT
Anesthesia: LOCAL

## 2016-03-11 SURGERY — RADIOLOGY WITH ANESTHESIA
Anesthesia: Choice

## 2016-03-11 MED ORDER — NITROGLYCERIN 1 MG/10 ML FOR IR/CATH LAB
INTRA_ARTERIAL | Status: AC
  Filled 2016-03-11: qty 10

## 2016-03-11 MED ORDER — SODIUM CHLORIDE 0.9 % IR SOLN
Status: AC
  Filled 2016-03-11: qty 2

## 2016-03-11 MED ORDER — INSULIN ASPART 100 UNIT/ML ~~LOC~~ SOLN
0.0000 [IU] | SUBCUTANEOUS | Status: DC
  Administered 2016-03-12: 2 [IU] via SUBCUTANEOUS
  Administered 2016-03-12: 3 [IU] via SUBCUTANEOUS
  Administered 2016-03-13 (×3): 5 [IU] via SUBCUTANEOUS

## 2016-03-11 MED ORDER — PROPOFOL 500 MG/50ML IV EMUL
INTRAVENOUS | Status: DC | PRN
  Administered 2016-03-11: 50 ug via INTRAVENOUS

## 2016-03-11 MED ORDER — IOPAMIDOL (ISOVUE-370) INJECTION 76%: 50.0000 mL | Freq: Once | INTRAVENOUS | Status: DC | PRN

## 2016-03-11 MED ORDER — IOPAMIDOL (ISOVUE-370) INJECTION 76%
INTRAVENOUS | Status: AC
  Filled 2016-03-11: qty 50

## 2016-03-11 MED ORDER — PROPOFOL 1000 MG/100ML IV EMUL
0.0000 ug/kg/min | INTRAVENOUS | Status: DC
  Administered 2016-03-11: 50 ug/kg/min via INTRAVENOUS
  Administered 2016-03-12: 10 ug/kg/min via INTRAVENOUS
  Filled 2016-03-11 (×2): qty 100

## 2016-03-11 MED ORDER — IOPAMIDOL (ISOVUE-300) INJECTION 61%
INTRAVENOUS | Status: AC
  Administered 2016-03-11: 100 mL
  Filled 2016-03-11: qty 300

## 2016-03-11 MED ORDER — FENTANYL CITRATE (PF) 100 MCG/2ML IJ SOLN
INTRAMUSCULAR | Status: AC
  Filled 2016-03-11: qty 2

## 2016-03-11 MED ORDER — SODIUM CHLORIDE 0.9 % IR SOLN: 80.0000 mg | Status: DC

## 2016-03-11 MED ORDER — ACETAMINOPHEN 500 MG PO TABS: 1000.0000 mg | ORAL_TABLET | Freq: Four times a day (QID) | ORAL | Status: DC | PRN

## 2016-03-11 MED ORDER — SODIUM CHLORIDE 0.9 % IV SOLN
INTRAVENOUS | Status: DC
  Administered 2016-03-11: 15:00:00 via INTRAVENOUS

## 2016-03-11 MED ORDER — SODIUM CHLORIDE 0.9 % IV SOLN
Freq: Once | INTRAVENOUS | Status: AC
  Administered 2016-03-11: 09:00:00 via INTRAVENOUS

## 2016-03-11 MED ORDER — PHENYLEPHRINE HCL 10 MG/ML IJ SOLN
10.0000 mg | INTRAVENOUS | Status: DC | PRN
  Administered 2016-03-11: 75 ug/min via INTRAVENOUS

## 2016-03-11 MED ORDER — FENTANYL CITRATE (PF) 100 MCG/2ML IJ SOLN: 50.0000 ug | INTRAMUSCULAR | Status: DC | PRN

## 2016-03-11 MED ORDER — HEPARIN (PORCINE) IN NACL 2-0.9 UNIT/ML-% IJ SOLN
INTRAMUSCULAR | Status: AC
  Filled 2016-03-11: qty 500

## 2016-03-11 MED ORDER — MIDAZOLAM HCL 2 MG/2ML IJ SOLN
2.0000 mg | INTRAMUSCULAR | Status: DC | PRN
  Administered 2016-03-11 – 2016-03-13 (×3): 2 mg via INTRAVENOUS
  Filled 2016-03-11 (×3): qty 2

## 2016-03-11 MED ORDER — ONDANSETRON HCL 4 MG/2ML IJ SOLN: 4.0000 mg | Freq: Four times a day (QID) | INTRAMUSCULAR | Status: DC | PRN

## 2016-03-11 MED ORDER — SODIUM CHLORIDE 0.9 % IV SOLN: 250.0000 mL | INTRAVENOUS | Status: DC

## 2016-03-11 MED ORDER — INSULIN ASPART 100 UNIT/ML ~~LOC~~ SOLN: 0.0000 [IU] | Freq: Three times a day (TID) | SUBCUTANEOUS | Status: DC

## 2016-03-11 MED ORDER — MIDAZOLAM HCL 2 MG/2ML IJ SOLN: 2.0000 mg | Freq: Once | INTRAMUSCULAR | Status: DC

## 2016-03-11 MED ORDER — CEFAZOLIN SODIUM-DEXTROSE 2-4 GM/100ML-% IV SOLN: 2.0000 g | INTRAVENOUS | Status: DC

## 2016-03-11 MED ORDER — SODIUM CHLORIDE 0.9 % IV SOLN
INTRAVENOUS | Status: DC
  Administered 2016-03-12 (×2): 1000 mL via INTRAVENOUS

## 2016-03-11 MED ORDER — LACTATED RINGERS IV SOLN
INTRAVENOUS | Status: DC | PRN
  Administered 2016-03-11: 17:00:00 via INTRAVENOUS

## 2016-03-11 MED ORDER — CALCIUM GLUCONATE 10 % IV SOLN
1.0000 g | Freq: Once | INTRAVENOUS | Status: AC
  Administered 2016-03-11: 1 g via INTRAVENOUS
  Filled 2016-03-11: qty 10

## 2016-03-11 MED ORDER — ONDANSETRON HCL 4 MG/2ML IJ SOLN
INTRAMUSCULAR | Status: AC
  Filled 2016-03-11: qty 2

## 2016-03-11 MED ORDER — SODIUM CHLORIDE 0.9 % IV SOLN
25.0000 ug/h | INTRAVENOUS | Status: DC
  Administered 2016-03-11: 25 ug/h via INTRAVENOUS
  Administered 2016-03-13: 50 ug/h via INTRAVENOUS
  Administered 2016-03-14: 250 ug/h via INTRAVENOUS
  Administered 2016-03-14: 275 ug/h via INTRAVENOUS
  Filled 2016-03-11 (×4): qty 50

## 2016-03-11 MED ORDER — ETOMIDATE 2 MG/ML IV SOLN
0.3000 mg/kg | Freq: Once | INTRAVENOUS | Status: AC
  Administered 2016-03-11: 20 mg via INTRAVENOUS

## 2016-03-11 MED ORDER — ONDANSETRON HCL 4 MG/2ML IJ SOLN
INTRAMUSCULAR | Status: DC | PRN
  Administered 2016-03-11: 4 mg via INTRAVENOUS

## 2016-03-11 MED ORDER — NITROGLYCERIN 0.4 MG SL SUBL: 0.4000 mg | SUBLINGUAL_TABLET | SUBLINGUAL | Status: DC | PRN

## 2016-03-11 MED ORDER — ACETAMINOPHEN 325 MG PO TABS
325.0000 mg | ORAL_TABLET | ORAL | Status: DC | PRN
  Administered 2016-03-12 – 2016-03-13 (×2): 650 mg via ORAL
  Filled 2016-03-11 (×2): qty 2

## 2016-03-11 MED ORDER — EPTIFIBATIDE 20 MG/10ML IV SOLN
INTRAVENOUS | Status: AC
  Filled 2016-03-11: qty 10

## 2016-03-11 MED ORDER — EPTIFIBATIDE 20 MG/10ML IV SOLN
INTRAVENOUS | Status: AC | PRN
  Administered 2016-03-11: 2 mg via INTRAVENOUS

## 2016-03-11 MED ORDER — ASPIRIN EC 81 MG PO TBEC: 81.0000 mg | DELAYED_RELEASE_TABLET | Freq: Every day | ORAL | Status: DC

## 2016-03-11 MED ORDER — FENTANYL CITRATE (PF) 100 MCG/2ML IJ SOLN
100.0000 ug | Freq: Once | INTRAMUSCULAR | Status: AC
  Administered 2016-03-11: 100 ug via INTRAVENOUS

## 2016-03-11 MED ORDER — ACETAMINOPHEN 325 MG PO TABS: 650.0000 mg | ORAL_TABLET | ORAL | Status: DC | PRN

## 2016-03-11 MED ORDER — NICARDIPINE HCL IN NACL 20-0.86 MG/200ML-% IV SOLN
5.0000 mg/h | INTRAVENOUS | Status: DC
  Administered 2016-03-11: 5 mg/h via INTRAVENOUS
  Filled 2016-03-11: qty 200

## 2016-03-11 MED ORDER — ACETAMINOPHEN 650 MG RE SUPP: 650.0000 mg | Freq: Four times a day (QID) | RECTAL | Status: DC | PRN

## 2016-03-11 MED ORDER — LIDOCAINE HCL (PF) 1 % IJ SOLN
INTRAMUSCULAR | Status: AC
  Filled 2016-03-11: qty 30

## 2016-03-11 MED ORDER — SODIUM BICARBONATE 8.4 % IV SOLN
INTRAVENOUS | Status: AC
  Administered 2016-03-11 (×2): 50 meq via INTRAVENOUS
  Filled 2016-03-11: qty 50

## 2016-03-11 MED ORDER — MIDAZOLAM HCL 2 MG/2ML IJ SOLN
INTRAMUSCULAR | Status: AC
  Administered 2016-03-11: 2 mg
  Filled 2016-03-11: qty 2

## 2016-03-11 MED ORDER — MIDAZOLAM HCL 2 MG/2ML IJ SOLN
INTRAMUSCULAR | Status: AC
  Filled 2016-03-11: qty 2

## 2016-03-11 MED ORDER — HYDROCHLOROTHIAZIDE 12.5 MG PO CAPS
12.5000 mg | ORAL_CAPSULE | Freq: Every day | ORAL | Status: DC
  Administered 2016-03-12: 12.5 mg via ORAL
  Filled 2016-03-11: qty 1

## 2016-03-11 MED ORDER — HYDRALAZINE HCL 25 MG PO TABS
25.0000 mg | ORAL_TABLET | Freq: Two times a day (BID) | ORAL | Status: DC
  Administered 2016-03-12 – 2016-03-13 (×3): 25 mg via ORAL
  Filled 2016-03-11 (×3): qty 1

## 2016-03-11 MED ORDER — LIDOCAINE HCL (PF) 1 % IJ SOLN
INTRAMUSCULAR | Status: AC
  Filled 2016-03-11: qty 60

## 2016-03-11 MED ORDER — ROCURONIUM BROMIDE 100 MG/10ML IV SOLN
INTRAVENOUS | Status: DC | PRN
  Administered 2016-03-11: 50 mg via INTRAVENOUS
  Administered 2016-03-11: 30 mg via INTRAVENOUS

## 2016-03-11 MED ORDER — FENTANYL CITRATE (PF) 100 MCG/2ML IJ SOLN
INTRAMUSCULAR | Status: DC | PRN
  Administered 2016-03-11: 50 ug via INTRAVENOUS
  Administered 2016-03-11: 150 ug via INTRAVENOUS
  Administered 2016-03-11: 50 ug via INTRAVENOUS

## 2016-03-11 MED ORDER — AMLODIPINE BESYLATE 10 MG PO TABS
10.0000 mg | ORAL_TABLET | Freq: Every day | ORAL | Status: DC
  Administered 2016-03-12 – 2016-03-13 (×2): 10 mg via ORAL
  Filled 2016-03-11 (×2): qty 1

## 2016-03-11 MED ORDER — HEPARIN SODIUM (PORCINE) 5000 UNIT/ML IJ SOLN
4000.0000 [IU] | Freq: Once | INTRAMUSCULAR | Status: DC
  Filled 2016-03-11: qty 1

## 2016-03-11 MED ORDER — LABETALOL HCL 5 MG/ML IV SOLN
INTRAVENOUS | Status: AC
  Filled 2016-03-11: qty 4

## 2016-03-11 MED ORDER — HEPARIN (PORCINE) IN NACL 100-0.45 UNIT/ML-% IJ SOLN: 12.0000 [IU]/kg/h | Freq: Once | INTRAMUSCULAR | Status: DC

## 2016-03-11 MED ORDER — PHENYLEPHRINE HCL 10 MG/ML IJ SOLN
INTRAMUSCULAR | Status: DC | PRN
  Administered 2016-03-11: 80 ug via INTRAVENOUS

## 2016-03-11 MED ORDER — SODIUM CHLORIDE 0.9 % IV SOLN
3.0000 g | Freq: Three times a day (TID) | INTRAVENOUS | Status: DC
  Administered 2016-03-12 – 2016-03-13 (×5): 3 g via INTRAVENOUS
  Filled 2016-03-11 (×8): qty 3

## 2016-03-11 MED ORDER — CHLORHEXIDINE GLUCONATE 4 % EX LIQD: 60.0000 mL | Freq: Once | CUTANEOUS | Status: DC

## 2016-03-11 MED ORDER — CEFAZOLIN IN D5W 1 GM/50ML IV SOLN
1.0000 g | Freq: Four times a day (QID) | INTRAVENOUS | Status: AC
  Administered 2016-03-11 – 2016-03-12 (×3): 1 g via INTRAVENOUS
  Filled 2016-03-11 (×3): qty 50

## 2016-03-11 MED ORDER — SODIUM CHLORIDE 0.9 % IV SOLN
INTRAVENOUS | Status: DC | PRN
  Administered 2016-03-11: 50 mL/h via INTRAVENOUS
  Administered 2016-03-11: 250 mL

## 2016-03-11 MED ORDER — MIDAZOLAM HCL 2 MG/2ML IJ SOLN
1.0000 mg | INTRAMUSCULAR | Status: DC | PRN
  Administered 2016-03-12: 1 mg via INTRAVENOUS
  Filled 2016-03-11: qty 2

## 2016-03-11 MED ORDER — FENTANYL BOLUS VIA INFUSION
25.0000 ug | INTRAVENOUS | Status: DC | PRN
  Administered 2016-03-13 (×3): 25 ug via INTRAVENOUS
  Filled 2016-03-11: qty 25

## 2016-03-11 MED ORDER — ATROPINE SULFATE 1 MG/10ML IJ SOSY
0.5000 mg | PREFILLED_SYRINGE | Freq: Once | INTRAMUSCULAR | Status: AC
  Administered 2016-03-11: 0.5 mg via INTRAVENOUS
  Filled 2016-03-11: qty 10

## 2016-03-11 MED ORDER — FENTANYL CITRATE (PF) 100 MCG/2ML IJ SOLN: 50.0000 ug | Freq: Once | INTRAMUSCULAR | Status: DC

## 2016-03-11 MED ORDER — METOPROLOL TARTRATE 5 MG/5ML IV SOLN
INTRAVENOUS | Status: DC | PRN
  Administered 2016-03-11: 5 mg via INTRAVENOUS

## 2016-03-11 MED ORDER — FUROSEMIDE 10 MG/ML IJ SOLN
80.0000 mg | Freq: Once | INTRAMUSCULAR | Status: AC
  Administered 2016-03-12: 80 mg via INTRAVENOUS
  Filled 2016-03-11: qty 8

## 2016-03-11 MED ORDER — ATROPINE SULFATE 1 MG/10ML IJ SOSY
1.0000 mg | PREFILLED_SYRINGE | Freq: Once | INTRAMUSCULAR | Status: AC
  Administered 2016-03-11: 1 mg via INTRAVENOUS
  Filled 2016-03-11: qty 10

## 2016-03-11 MED ORDER — PROPOFOL 500 MG/50ML IV EMUL
INTRAVENOUS | Status: DC | PRN
  Administered 2016-03-11: 25 ug/kg/min via INTRAVENOUS

## 2016-03-11 MED ORDER — ALBUTEROL SULFATE (2.5 MG/3ML) 0.083% IN NEBU: 2.5000 mg | INHALATION_SOLUTION | RESPIRATORY_TRACT | Status: DC | PRN

## 2016-03-11 MED ORDER — BISACODYL 10 MG RE SUPP: 10.0000 mg | Freq: Every day | RECTAL | Status: DC | PRN

## 2016-03-11 MED ORDER — METOPROLOL TARTRATE 5 MG/5ML IV SOLN
INTRAVENOUS | Status: AC
  Filled 2016-03-11: qty 5

## 2016-03-11 MED ORDER — LISINOPRIL-HYDROCHLOROTHIAZIDE 20-12.5 MG PO TABS: 2.0000 | ORAL_TABLET | Freq: Every day | ORAL | Status: DC

## 2016-03-11 MED ORDER — STROKE: EARLY STAGES OF RECOVERY BOOK
Freq: Once | Status: DC
  Filled 2016-03-11: qty 1

## 2016-03-11 MED ORDER — PRAVASTATIN SODIUM 20 MG PO TABS
20.0000 mg | ORAL_TABLET | Freq: Every day | ORAL | Status: DC
  Administered 2016-03-12: 20 mg via ORAL
  Filled 2016-03-11: qty 1

## 2016-03-11 MED ORDER — SODIUM CHLORIDE 0.9 % IV SOLN
INTRAVENOUS | Status: DC | PRN
  Administered 2016-03-11: 17:00:00 via INTRAVENOUS

## 2016-03-11 MED ORDER — SODIUM CHLORIDE 0.9% FLUSH: 3.0000 mL | Freq: Two times a day (BID) | INTRAVENOUS | Status: DC

## 2016-03-11 MED ORDER — SODIUM BICARBONATE 8.4 % IV SOLN
INTRAVENOUS | Status: AC
  Filled 2016-03-11: qty 50

## 2016-03-11 MED ORDER — ASPIRIN 81 MG PO CHEW
324.0000 mg | CHEWABLE_TABLET | Freq: Once | ORAL | Status: DC
  Filled 2016-03-11: qty 4

## 2016-03-11 MED ORDER — MIDAZOLAM HCL 5 MG/5ML IJ SOLN
INTRAMUSCULAR | Status: AC
  Filled 2016-03-11: qty 5

## 2016-03-11 MED ORDER — HEPARIN BOLUS VIA INFUSION
4000.0000 [IU] | Freq: Once | INTRAVENOUS | Status: DC
  Filled 2016-03-11: qty 4000

## 2016-03-11 MED ORDER — FENTANYL CITRATE (PF) 100 MCG/2ML IJ SOLN
INTRAMUSCULAR | Status: AC
  Administered 2016-03-11: 100 ug
  Filled 2016-03-11: qty 2

## 2016-03-11 MED ORDER — CEFAZOLIN SODIUM-DEXTROSE 2-4 GM/100ML-% IV SOLN
INTRAVENOUS | Status: AC
  Filled 2016-03-11: qty 100

## 2016-03-11 MED ORDER — FAMOTIDINE 40 MG/5ML PO SUSR
20.0000 mg | Freq: Two times a day (BID) | ORAL | Status: DC
  Administered 2016-03-12 – 2016-03-13 (×2): 20 mg via ORAL
  Filled 2016-03-11 (×2): qty 2.5

## 2016-03-11 MED ORDER — IOPAMIDOL (ISOVUE-370) INJECTION 76%
INTRAVENOUS | Status: DC | PRN
  Administered 2016-03-11: 10 mL via INTRAVENOUS

## 2016-03-11 MED ORDER — LISINOPRIL 20 MG PO TABS
20.0000 mg | ORAL_TABLET | Freq: Every day | ORAL | Status: DC
  Administered 2016-03-12: 20 mg via ORAL
  Filled 2016-03-11: qty 1

## 2016-03-11 MED ORDER — SODIUM CHLORIDE 0.9% FLUSH: 3.0000 mL | INTRAVENOUS | Status: DC | PRN

## 2016-03-11 MED ORDER — LABETALOL HCL 5 MG/ML IV SOLN
20.0000 mg | Freq: Once | INTRAVENOUS | Status: AC
  Administered 2016-03-11: 20 mg via INTRAVENOUS

## 2016-03-11 MED ORDER — HEPARIN (PORCINE) IN NACL 100-0.45 UNIT/ML-% IJ SOLN
850.0000 [IU]/h | INTRAMUSCULAR | Status: DC
  Filled 2016-03-11: qty 250

## 2016-03-11 MED ORDER — LIDOCAINE HCL 1 % IJ SOLN
INTRAMUSCULAR | Status: AC
  Filled 2016-03-11: qty 20

## 2016-03-11 MED ORDER — MIDAZOLAM HCL 2 MG/2ML IJ SOLN: 1.0000 mg | INTRAMUSCULAR | Status: DC | PRN

## 2016-03-11 MED ORDER — SODIUM CHLORIDE 0.9 % IV SOLN
Freq: Once | INTRAVENOUS | Status: AC
  Administered 2016-03-11: 10:00:00 via INTRAVENOUS

## 2016-03-11 MED ORDER — LIDOCAINE HCL (PF) 1 % IJ SOLN
INTRAMUSCULAR | Status: DC | PRN
  Administered 2016-03-11: 30 mL
  Administered 2016-03-11: 20 mL

## 2016-03-11 MED ORDER — CEFAZOLIN SODIUM-DEXTROSE 2-3 GM-% IV SOLR
INTRAVENOUS | Status: DC | PRN
  Administered 2016-03-11: 2 g via INTRAVENOUS

## 2016-03-11 SURGICAL SUPPLY — 7 items
CABLE SURGICAL S-101-97-12 (CABLE) ×4 IMPLANT
LEAD TENDRIL MRI 52CM LPA1200M (Lead) ×2 IMPLANT
LEAD TENDRIL MRI 58CM LPA1200M (Lead) ×2 IMPLANT
PACEMAKER ASSURITY DR-RF (Pacemaker) ×2 IMPLANT
PAD DEFIB LIFELINK (PAD) ×2 IMPLANT
SHEATH CLASSIC 8F (SHEATH) ×4 IMPLANT
TRAY PACEMAKER INSERTION (PACKS) ×2 IMPLANT

## 2016-03-11 NOTE — Transfer of Care (Signed)
Immediate Anesthesia Transfer of Care Note  Patient: Mason Stevens  Procedure(s) Performed: Procedure(s): RADIOLOGY WITH ANESTHESIA (N/A)  Patient Location: PACU and NICU  Anesthesia Type:General  Level of Consciousness: unresponsive and Patient remains intubated per anesthesia plan  Airway & Oxygen Therapy: Patient remains intubated per anesthesia plan and Patient placed on Ventilator (see vital sign flow sheet for setting)  Post-op Assessment: Report given to RN and Post -op Vital signs reviewed and stable  Post vital signs: Reviewed and stable  Last Vitals:  Filed Vitals:   September 25, 2015 1540 September 25, 2015 1656  BP:  114/60  Pulse: 0 65  Resp: 0 16    Last Pain: There were no vitals filed for this visit.       Complications: No apparent anesthesia complications

## 2016-03-11 NOTE — Code Documentation (Signed)
82yo male who underwent successful placement of permanent pacemaker today for bradycardia.  Cath lab staff report that patient was LKW at 1450 during the procedure.  Patient hypertensive and became agitated during the case.  Patient noted to be nonverbal with right hemiplegia.  Code stroke activated.  Stroke team responded to cath lab holding.  Patient with decreased oxygen saturations and placed on nonrebreather.  Patient hypertensive.  Neurologist to the bedside.  Patient transferred to CT2 with team.  Patient requiring continuous assistance by staff to keep left arm in sling.  CT completed.  NIHSS 25, see documentation for details and code stroke times.  CTA ordered and completed.  Some delays with imaging due to patient agitation.  CTA showing left M1 occlusion.  IR notified of patient.  Patient continued to be hypertensive and Labetalol 20mg  IVP given per order by cath lab RN.  Patient transferred to [facility] for intubation.  Dr. Corliss Skainseveshwar updated on patient by Stroke RN and MD.  CCM team to the bedside and patient intubated.  Family in waiting room and updated by Dr. Hilda BladesArmstrong and Dr. Delton CoombesByrum and plan of care addressed.  Patient transported to IR and family escorted to IR.  Handoff with IR staff.  Patient to be transferred to 95M post-procedure.

## 2016-03-11 NOTE — Progress Notes (Signed)
Pharmacy Antibiotic Note  Mason Stevens is a 82 y.o. male admitted on 03/25/2016 with pneumonia.  Pharmacy has been consulted for Unasyn dosing.  Plan: Unasyn 3 grams iv Q 8 hours Pharmacy to sign off.     Temp (24hrs), Avg:97.7 F (36.5 C), Min:97.5 F (36.4 C), Max:97.9 F (36.6 C)   Recent Labs Lab 03/28/2016 0840  WBC 8.8  CREATININE 1.33*    Estimated Creatinine Clearance: 40.9 mL/min (by C-G formula based on Cr of 1.33).    No Known Allergies   Thank you for allowing pharmacy to be a part of this patient's care. Okey RegalLisa Chelcea Zahn, PharmD [phone] 03/07/2016 5:06 PM

## 2016-03-11 NOTE — Consult Note (Signed)
PULMONARY / CRITICAL CARE MEDICINE   Name: Mason [name] MRN: [id number] DOB: [birth year]    ADMISSION DATE:  03/10/2016 CONSULTATION DATE:  03/23/2016  REFERRING MD:  Dr. Ladona Ridgelaylor   CHIEF COMPLAINT:  Respiratory Distress   HISTORY OF PRESENT ILLNESS:  82 year old male with a past medical history of BPH, peripheral neuropathy, atrial fibrillation (diagnosed 09/2014, not on anticoagulation secondary to falls) prior subdural hematoma status post evacuation, hypertension and diabetes who presented to [facility] ER on 7/14 with complaints of "feeling funny".  At baseline the patient is active, reported on admission that he exercises daily on a bike.  He reported on presentation that he had noted over the past week that he felt somewhat slowed but pushed through daily activities. 2-3 days prior to presentation he felt like he had no energy at all and could hardly get out of bed. He also had noted that he felt like he at times had no heart rate on palpation. He reported to [facility] ER for evaluation. At that time he was found to be profoundly bradycardic with a heart rate in the 20s with underlying atrial fibrillation. [facility] and pacemaker placement. During pacemaker placement procedure, the patient was noted to have a change in work of breathing, altered mental status with decreased movement on the right. Pacemaker was placed. Emergent CT of the head was obtained which showed no acute hemorrhage or nonhemorrhagic infarct. Per stroke RN, CT angiogram of the head and neck showed concerns for distal M1 infarct.  He was emergently returned to ICU for intubation in the setting of respiratory distress for airway protection.  CODE STATUS discussed with family and they confirmed full code.  PC CM consulted for evaluation.  PAST MEDICAL HISTORY :  He  has a past medical history of Diabetes mellitus without complication (HCC) and Hypertension.  PAST SURGICAL  HISTORY: He  has past surgical history that includes Craniotomy (Bilateral, 10/16/2014).  No Known Allergies  No current facility-administered medications on file prior to encounter.   Current Outpatient Prescriptions on File Prior to Encounter  Medication Sig  . acetaminophen (TYLENOL) 500 MG tablet Take 500 mg by mouth every 6 (six) hours as needed for moderate pain.  Marland Kitchen. amLODipine (NORVASC) 10 MG tablet Take 1 tablet (10 mg total) by mouth daily.  . hydrALAZINE (APRESOLINE) 25 MG tablet Take 25 mg by mouth 2 (two) times daily.  Marland Kitchen. lisinopril-hydrochlorothiazide (PRINZIDE,ZESTORETIC) 20-12.5 MG tablet Take 2 tablets by mouth daily.  Marland Kitchen. lovastatin (MEVACOR) 20 MG tablet Take 2 tablets (40 mg total) by mouth at bedtime.  . metFORMIN (GLUCOPHAGE) 1000 MG tablet Take 1 tablet (1,000 mg total) by mouth 2 (two) times daily with a meal.  . Multiple Vitamin (MULTIVITAMIN WITH MINERALS) TABS tablet Take 1 tablet by mouth daily.    FAMILY HISTORY:  His has no family status information on file.   SOCIAL HISTORY: He  reports that he has quit smoking. He does not have any smokeless tobacco history on file. He reports that he does not drink alcohol.  REVIEW OF SYSTEMS:  Unable to complete as patient is altered on mechanical ventilation.  SUBJECTIVE:   VITAL SIGNS: BP 190/86 mmHg  Pulse 0  Resp 0  SpO2 0%  HEMODYNAMICS:    VENTILATOR SETTINGS:    INTAKE / OUTPUT:    PHYSICAL EXAMINATION: General:  Elderly male in acute distress Neuro:  Eyes closed, does not answer when name called, no movement noted on  the right, agitated movement on the left. HEENT:  MM pink/moist, no JVD Cardiovascular:  s1s2 distant due to adventitous breath sounds Lungs:  Dyspneic with abdominal accessory muscle use prior to intubation, coarse breath sounds bilaterally with frothy white sputum post intubation. Abdomen:  Soft, BSx4 active Musculoskeletal:  No acute deformities, left upper extremity in sling post  pacemaker placement Skin:  Diaphoretic, pale, no edema  LABS:  BMET  Recent Labs Lab 04-05-2016 0840  NA 138  K 3.9  CL 107  CO2 19*  BUN 31*  CREATININE 1.33*  GLUCOSE 143*    Electrolytes  Recent Labs Lab 2016-04-05 0840  CALCIUM 9.3    CBC  Recent Labs Lab April 05, 2016 0840  WBC 8.8  HGB 13.0  HCT 39.2*  PLT 209    Coag's  Recent Labs Lab 04-05-16 0840  APTT 33  INR 0.99    Sepsis Markers No results for input(s): LATICACIDVEN, PROCALCITON, O2SATVEN in the last 168 hours.  ABG No results for input(s): PHART, PCO2ART, PO2ART in the last 168 hours.  Liver Enzymes  Recent Labs Lab Apr 05, 2016 0840  AST 30  ALT 16*  ALKPHOS 60  BILITOT 0.8  ALBUMIN 3.8    Cardiac Enzymes  Recent Labs Lab 05-Apr-2016 0840  TROPONINI 0.09*    Glucose  Recent Labs Lab 04-05-16 1548  GLUCAP 168*    Imaging Ct Head Wo Contrast  05-Apr-2016  CLINICAL DATA:  Code stroke. EXAM: CT HEAD WITHOUT CONTRAST TECHNIQUE: Contiguous axial images were obtained from the base of the skull through the vertex without intravenous contrast. COMPARISON:  01/07/2015 FINDINGS: Brain: Markedly diminished exam detail secondary to motion artifact. Patient moving during examination. Unable to maintain stillness. No evidence of acute infarction, hemorrhage, extra-axial collection, ventriculomegaly, or mass effect. Vascular: No hyperdense vessel or unexpected calcification. Skull: Bilateral craniotomies have been performed. Sinuses/Orbits: No acute findings. Other: None. IMPRESSION: 1. No acute hemorrhage or large non-hemorrhagic infarct is present. Smaller nonhemorrhagic infarcts may be obscured by patient motion. Electronically Signed   By: Signa Kell M.D.   On: 2016/04/05 16:28   Dg Chest Port 1 View  04-05-2016  CLINICAL DATA:  Bradycardia. EXAM: PORTABLE CHEST 1 VIEW COMPARISON:  10/17/2014 FINDINGS: Mild cardiomegaly. Lungs are clear. No effusions or acute bony abnormality. IMPRESSION:  Mild cardiomegaly.  No active disease. Electronically Signed   By: Charlett Nose M.D.   On: April 05, 2016 08:41     STUDIES:  CT head 7/14 >> no acute hemorrhage CTA head/neck 7/14 >> distal M1 infarct  CULTURES:   ANTIBIOTICS:   SIGNIFICANT EVENTS: 7/14  Admit to [facility] with symptomatically bradycardia.  Developed right-sided weakness and rest for distress during cath, CTA of the head concerning for distal M1 infarct.  To neuro IR  LINES/TUBES: ETT 7/14 >>  DISCUSSION: 82 year old male with a past medical history of atrial fibrillation not on anticoagulation, diabetes and hypertension who was admitted 7/14 with symptomatically bradycardia S/P PPM who developed right-sided weakness and respiratory distress during procedure. Emergent CT of the head concerning for distal M1 infarct.  Intubated emergently post CT.  ASSESSMENT / PLAN:  PULMONARY A: Acute hypoxic respiratory failure  Concern for pulmonary edema / CHF P:   PRBC 8 cc/KG Wean PEEP and FiO2 for sats greater than 92% Portable x-ray machine nonfunctional at time of note. ETT will be confirmed via bronchoscopy ABG 1 hour after initiation of ventilator Intermittent CXR When necessary albuterol for wheezing  CARDIOVASCULAR A:  Symptomatically bradycardia status post PPM 7/14  Concern for CHF - 2/16 LVEF 55% Hx HTN, atrial fib not on any coagulation P:  ICU monitoring of hemodynamics Repeat echo PPM care per cardiology Hypertension control Monitor troponin Lasix as above  RENAL A:   AKI - setting of symptomatically bradycardia, hypertension P:   Lasix 80 mg 1 Trend BMP/UOP Place electrolytes as indicated  GASTROINTESTINAL A:   At risk protein calorie malnutrition P:   Nothing by mouth Consider TF in a.m. if he remains intubated Pepcid for stress ulcer prophylaxis  HEMATOLOGIC A:   No acute issues Not an anti-coagulation candidate due to history of falls and SDH P:  Trend CBC SCDs for DVT  prophylaxis  INFECTIOUS A:   Asymmetric infiltrates - concern for aspiration P:   Monitor fever curve/WBC Empiric Unasyn for aspiration Trend PCT  ENDOCRINE A:   Diabetes mellitus  P:   Sliding scale insulin Q4  NEUROLOGIC A:   Acute encephalopathy / right-sided weakness - CTA head concerning for distal M1 infarct P:   RASS goal: 0 to -1 Fentanyl drip for pain PRN Versed for sedation Neuro IR evaluation per Dr. Corliss Skains   FAMILY  - Updates: Family updated at bedside per Dr. Delton Coombes   - Inter-disciplinary family meet or Palliative Care meeting due by:  7/21    Canary Brim, NP-C [facility] Pgr: [phone] or if no answer [phone] 03/01/2016, 5:07 PM   Attending Note:  I have examined patient, reviewed labs, studies and notes. I have discussed the case with B Ollis, and I agree with the data and plans as amended above. 82 yo man with A Fib, hx of prior SDH, admitted for urgent pacer placement due to severe bradycardia. He had a change in both MS and resp status during that procedure, initially ascribed to his bradycardia and cardiac compromise. He continued to evolve even after pacer was placed and functioning - then developed R hemiplegia. Head CT Angio was initially interpreted as probable distal M1 infarct. He was brought to the ICU in respiratory distress, SpO2 60's%. Emergently intubated after discussing status and goals for care with his family. CXR confirms B infiltrates, R > L concerning for pulm edema vs aspiration injury. On my evaluation prior to intubation he was poorly responsive, in clear resp distress with hypoxemia. relatively hypertensive. Coarse breath sounds without wheezing. Post-intubation he was recruited and placed on PEEP 10 with some rebound in saturations. We will plan to give single dose lasix, redose depending on renal fxn. Treat empirically with unasyn for possible aspiration and cx resp secretions. Vent 1.00 + PEEP 10. Check ABG  on return to ICU. He will go directly to IR for possible intervention for his MCA CVA. Independent critical care time is 60 minutes.   Levy Pupa, MD, PhD 03/15/2016, 5:08 PM [facility] [phone] or if no answer [phone]

## 2016-03-11 NOTE — Anesthesia Preprocedure Evaluation (Signed)
Anesthesia Evaluation  Patient identified by MRN, date of birth, ID band Patient awake    Reviewed: Allergy & Precautions, Patient's Chart, lab work & pertinent test results, Unable to perform ROS - Chart review only  Airway Mallampati: Intubated       Dental   Pulmonary former smoker,    Pulmonary exam normal        Cardiovascular hypertension, Pt. on medications Normal cardiovascular exam+ dysrhythmias + pacemaker      Neuro/Psych CVA today -> IR CVA, Residual Symptoms    GI/Hepatic   Endo/Other  diabetes, Type 2  Renal/GU      Musculoskeletal   Abdominal   Peds  Hematology   Anesthesia Other Findings   Reproductive/Obstetrics                             Anesthesia Physical Anesthesia Plan  ASA: III and emergent  Anesthesia Plan: General   Post-op Pain Management:    Induction: Intravenous  Airway Management Planned: Oral ETT  Additional Equipment: Arterial line  Intra-op Plan:   Post-operative Plan: Post-operative intubation/ventilation  Informed Consent: I have reviewed the patients History and Physical, chart, labs and discussed the procedure including the risks, benefits and alternatives for the proposed anesthesia with the patient or authorized representative who has indicated his/her understanding and acceptance.     Plan Discussed with: CRNA and Surgeon  Anesthesia Plan Comments:         Anesthesia Quick Evaluation

## 2016-03-11 NOTE — Progress Notes (Signed)
Code stroke team present. To CT.

## 2016-03-11 NOTE — Progress Notes (Signed)
Brought pt to IR #2 from 2H04. Pt intubated and sedated. Unresponsive to verbal or tactile stimuli. Family at bedside.

## 2016-03-11 NOTE — Procedures (Signed)
Intubation Procedure Note Mason Stevens [id number] Jun 14, 1927  Procedure: Intubation Indications: Respiratory insufficiency  Procedure Details Consent: Risks of procedure as well as the alternatives and risks of each were explained to the (patient/caregiver).  Consent for procedure obtained. Time Out: Verified patient identification, verified procedure, site/side was marked, verified correct patient position, special equipment/implants available, medications/allergies/relevent history reviewed, required imaging and test results available.  Performed  Maximum sterile technique was used including gloves, gown and hand hygiene.  MAC 4 Glidescope    Evaluation Hemodynamic Status: BP stable throughout; O2 sats: were low in the 60's% on arrival to teh ICU and remained so despite bag-mask ventilation until ETT was placed.  Patient's Current Condition: stable Complications: No apparent complications Patient did tolerate procedure well. Chest X-ray ordered to verify placement.  CXR: pending.   Mason Stevens S. 03/26/2016

## 2016-03-11 NOTE — Procedures (Signed)
S/P lt common carotid arteriogram,followed  By complete revascularization of occluded Ltb MCA with x pass of solitaire 4 mm x 40 mm retrieval device  With TICI 3  flow restoration

## 2016-03-11 NOTE — ED Notes (Signed)
Pt came via [facility] for low HR. Pt called because heart rate felt funny when moving around. HR in 20s and 30s on EMS arrival. Pt asymptomatic with stable VS

## 2016-03-11 NOTE — Anesthesia Postprocedure Evaluation (Signed)
Anesthesia Post Note  Patient: Mason Stevens  Procedure(s) Performed: Procedure(s) (LRB): RADIOLOGY WITH ANESTHESIA (N/A)  Patient location during evaluation: SICU Anesthesia Type: General Level of consciousness: sedated and patient remains intubated per anesthesia plan Pain management: pain level controlled Vital Signs Assessment: post-procedure vital signs reviewed and stable Respiratory status: patient remains intubated per anesthesia plan and patient on ventilator - see flowsheet for VS Cardiovascular status: stable Anesthetic complications: no    Last Vitals:  Filed Vitals:   03/04/2016 2030 03/06/2016 2045  BP: 151/71 128/65  Pulse: 59 58  Temp:    Resp: 16 16    Last Pain: There were no vitals filed for this visit.               Cecile HearingStephen Edward Edi Gorniak

## 2016-03-11 NOTE — Progress Notes (Signed)
ANTICOAGULATION CONSULT NOTE - Initial Consult  Pharmacy Consult for Heparin Indication: chest pain/ACS  No Known Allergies  Patient Measurements: Height: 5\' 11"  (180.3 cm) Weight: 160 lb (72.576 kg) IBW/kg (Calculated) : 75.3 Heparin Dosing Weight: 72.6 kg  Vital Signs: Temp: 97.9 F (36.6 C) (07/14 0827) Temp Source: Oral (07/14 0827) BP: 128/76 mmHg (07/14 0925) Pulse Rate: 60 (07/14 0925)  Labs:  Recent Labs  03/27/2016 0840  HGB 13.0  HCT 39.2*  PLT 209  APTT 33  LABPROT 13.3  INR 0.99  CREATININE 1.33*  TROPONINI 0.09*    Estimated Creatinine Clearance: 40.9 mL/min (by C-G formula based on Cr of 1.33).   Medical History: Past Medical History  Diagnosis Date  . Diabetes mellitus without complication (HCC)   . Hypertension     Medications:   (Not in a hospital admission) Scheduled:  . aspirin  324 mg Oral Once  . heparin  4,000 Units Intravenous Once    Assessment: 82 y/o M admitted with symptomatic bradycardia to begin heparin infusion. No anticoagulants PTA per med rec.   Goal of Therapy:  Heparin level 0.3-0.7 units/ml Monitor platelets by anticoagulation protocol: Yes   Plan:  Give 4000 units bolus x 1 Start heparin infusion at 850 units/hr Check anti-Xa level in 8 hours and daily while on heparin Continue to monitor H&H and platelets  Luisa HartChristy, Taronda Comacho D 03/13/2016,9:42 AM

## 2016-03-11 NOTE — ED Notes (Addendum)
Report to justin with [facility]. Pt prepared for transfer.

## 2016-03-11 NOTE — Progress Notes (Signed)
Orthopedic Tech Progress Note Patient Details:  Mason Stevens [birth year] [id number] Patient already has arm sling on. Patient ID: Mason Stevens, male   DOB: [birth year], 82 y.o.   MRN: [id number]   [name], [name] 08-05-2016, 9:58 PM

## 2016-03-11 NOTE — ED Provider Notes (Addendum)
[facility] Emergency Department Provider Note        Time seen: ----------------------------------------- 8:21 AM on 03/10/2016 -----------------------------------------    I have reviewed the triage vital signs and the nursing notes.   HISTORY  Chief Complaint No chief complaint on file.    HPI Mason Stevens is a 82 y.o. male who presents to the ER for low heart rate. Patient called EMS because his heart rate felt funny when he was moving around. Patient reports occasionally he has no heartbeat and it requires him riding a stationary bike to get his heart rate up. His heart rate was in the 20s and 30s on EMS arrival. Patient states he does not have any symptoms as long as he is lying down. He states his heart feels funny and does not feel well when he is up moving around.   No past medical history on file.  Patient Active Problem List   Diagnosis Date Noted  . Calcaneal spur of left foot 10/29/2014  . Traumatic subdural hemorrhage with loss of consciousness of 31 minutes to 59 minutes (HCC) 10/24/2014  . Protein-calorie malnutrition, severe (HCC) 10/17/2014  . Subdural hematoma (HCC) 10/16/2014  . Atrial fibrillation (HCC) 10/16/2014  . Type 2 diabetes mellitus without complication (HCC)   . Anemia due to other cause     Past Surgical History  Procedure Laterality Date  . Craniotomy Bilateral 10/16/2014    Procedure: BILATERALCRANIOTOMY HEMATOMA EVACUATION SUBDURAL;  Surgeon: Maeola HarmanJoseph Stern, MD;  Location: [facility];  Service: Neurosurgery;  Laterality: Bilateral;  BILATERALCRANIOTOMY HEMATOMA EVACUATION SUBDURAL    Allergies Review of patient's allergies indicates no known allergies.  Social History Social History  Substance Use Topics  . Smoking status: Not on file  . Smokeless tobacco: Not on file  . Alcohol Use: Not on file    Review of Systems Constitutional: Negative for fever. Cardiovascular: Negative for chest  pain. Respiratory: Negative for shortness of breath. Gastrointestinal: Negative for abdominal pain, vomiting and diarrhea. Genitourinary: Negative for dysuria. Musculoskeletal: Negative for back pain. Skin: Negative for rash. Neurological: Negative for headaches, Positive for weakness  10-point ROS otherwise negative.  ____________________________________________   PHYSICAL EXAM:  VITAL SIGNS: ED Triage Vitals  Enc Vitals Group     BP --      Pulse --      Resp --      Temp --      Temp src --      SpO2 --      Weight --      Height --      Head Cir --      Peak Flow --      Pain Score --      Pain Loc --      Pain Edu? --      Excl. in GC? --     Constitutional: Alert and oriented. Well appearing and in no distress. Eyes: Conjunctivae are normal. PERRL. Normal extraocular movements. ENT   Head: Normocephalic and atraumatic.   Nose: No congestion/rhinnorhea.   Mouth/Throat: Mucous membranes are moist.   Neck: No stridor. Cardiovascular: Slow rate, irregular rhythm.  Respiratory: Normal respiratory effort without tachypnea nor retractions. Breath sounds are clear and equal bilaterally. No wheezes/rales/rhonchi. Gastrointestinal: Soft and nontender. Normal bowel sounds Musculoskeletal: Nontender with normal range of motion in all extremities. No lower extremity tenderness nor edema. Neurologic:  Normal speech and language. No gross focal neurologic deficits are appreciated.  Skin:  Skin is warm,  dry and intact. No rash noted. Psychiatric: Mood and affect are normal. Speech and behavior are normal.  ____________________________________________  EKG: Interpreted by me. Junctional rhythm with a rate of 54 bpm, bigeminy, right bundle branch block. Left axis deviation, ST depressions  Repeat EKG interpreted by me reveals junctional rhythm with a rate of 24 bpm, PVC, left axis deviation, right bundle branch block. ST  depressions ___________________________________________  ED COURSE:  Pertinent labs & imaging results that were available during my care of the patient were reviewed by me and considered in my medical decision making (see chart for details). Patient presents to the ER for markedly slow heart rate. We will check basic labs and imaging. He does take amlodipine, I will give IV calcium for potential overdose. ____________________________________________    LABS (pertinent positives/negatives)  Labs Reviewed  CBC - Abnormal; Notable for the following:    HCT 39.2 (*)    RDW 16.9 (*)    All other components within normal limits  APTT  PROTIME-INR  TROPONIN I  COMPREHENSIVE METABOLIC PANEL   CRITICAL CARE Performed by: Emily Filbert   Total critical care time: 30 minutes  Critical care time was exclusive of separately billable procedures and treating other patients.  Critical care was necessary to treat or prevent imminent or life-threatening deterioration.  Critical care was time spent personally by me on the following activities: development of treatment plan with patient and/or surrogate as well as nursing, discussions with consultants, evaluation of patient's response to treatment, examination of patient, obtaining history from patient or surrogate, ordering and performing treatments and interventions, ordering and review of laboratory studies, ordering and review of radiographic studies, pulse oximetry and re-evaluation of patient's condition.  RADIOLOGY Images were viewed by me  Chest x-ray IMPRESSION: Mild cardiomegaly. No active disease.  ____________________________________________  FINAL ASSESSMENT AND PLAN  Symptomatic bradycardia  Plan: Patient with labs and imaging as dictated above. Patient with significant bradycardia, no discernible P waves here. He has not responded to IV calcium or 2 atropine. I will discuss with cardiology for pacemaker placement.  He is being placed on heparin at this time.   Emily Filbert, MD   Note: This dictation was prepared with Dragon dictation. Any transcriptional errors that result from this process are unintentional   Emily Filbert, MD 03-24-16 0454  Emily Filbert, MD 24-Mar-2016 0981  Emily Filbert, MD 03/24/16 1027

## 2016-03-11 NOTE — Progress Notes (Signed)
Subjective:  Acute onset of right hemiplegia in the setting of pacemaker placement.  Exam: Filed Vitals:   06/19/16 1957 06/19/16 2000  BP:  144/75  Pulse: 59 59  Temp:    Resp: 16 14    HEENT-  Normocephalic, no lesions, without obvious abnormality.  Normal external eye and conjunctiva.  Normal TM's bilaterally.  Normal auditory canals and external ears. Normal external nose, mucus membranes and septum.  Normal pharynx. Cardiovascular- afib   Lungs- chest clear, no wheezing, rales, normal symmetric air entry, Heart exam - S1, S2 normal, no murmur, no gallop, rate regular   Gen: In bed, aphasic, agitated MS: confused, aphasic GN:FAOZHCN:right facial droop Motor: dense right hemiplegia Sensory:present to dps   Pertinent Labs/Diagnostics: reviewed    Impression:   [name] is s/p placement of a pacemaker.  It was immediately noted that he had a dense right hemiplegia.  The code team was urgently activated.  He was taken to the CT suite where CT revealed slight effacement of the insular cortical region.  The CTA was indicative of an occluded left M1.  TPA was considered and discussed with the family.  TPA was not performed as the patient has a recent history of bilateral craniotomy for SDH.  In addition, the patient was noted to have complete right sided paralysis suggestive of severe CVA.  There was some evidence of edema with some effacement of the insular cortex.  The IR team was consulted and felt intervention was indicated.  Prior to IR the patient was taken to the cardiac ICU.  After consultation with the ICU attending, it was felt best [facility] and intubated in a controlled ICU setting prior to IR.  The case, risks, and benefits were discussed with all available family members.   Recommendations: 1) IR for left M2 occlusion 2. Admit to neuro ICU   James A. Hilda BladesArmstrong, M.D. Neurohospitalist Phone: [phone]   10-05-2015, 8:14 PM

## 2016-03-11 NOTE — ED Notes (Signed)
Attempted to call report to [id number] but they report do not have a nurse yet. Informed them that carelink is 5 min out.

## 2016-03-11 NOTE — Progress Notes (Signed)
eLink Physician-Brief Progress Note Patient Name: Mason [name] DOB: 14-Dec-1926 MRN: [id number]   Date of Service  December 23, 2015  HPI/Events of Note  Pt seen when he came back from IR from revascularization after sustaining and acute CVA after PM placement.  Was intubated prior to revascularization 2/2 unable to protect airway.  Pt seen. Sedated. Comfortable. Not in distress.  122/60, 60  24   eICU Interventions  Cont present meds. Keep intubated. Per IR, keep BP 120-140 sys. Had some diuresis post intubation.  Family updated at bedside.         [name] [name] December 23, 2015, 9:05 PM

## 2016-03-11 NOTE — H&P (Signed)
H&P    Patient ID: Mason Stevens MRN: [id number], DOB/AGE: 10/30/1926 82 y.o.  Admit date: 11-01-2015 Date of Admit: 11-01-2015   Primary Phys[facility] Primary Cardiologist: none, new to Dr. Ladona Ridgelaylor  Reason for Admission: severe bradycardia  HPI: Mason [name] Siegfried is a 82 y.o. male with PMHx of HTN, DM, BPH, peripheral neuropathy, and AFib, this newly diagnosed Feb 2016 though not a/c in the setting of falls and b/l SDH at that time that required evacuation. Since then he reports doing very well, exercises daily riding bike.  In the last week or so he has felt like he has slowed somewhat, but pushed through that feeling until 2-3 days ago, when he felt like he had no energy at all, today going to the ER stating he felt like he could hardly get out of bed.  No CP, SOB.    The patient was [facility] ER and noted to be profoundly bradycardic with HR 20-30 range with underlying atrial fib, his BP OK and [facility] and PPM implantation.  The patient currently feels OK at rest, tired and no energy.  No other complaints.  He has not fainted or fallen.  Past Medical History  Diagnosis Date  . Diabetes mellitus without complication (HCC)   . Hypertension      Surgical History:  Past Surgical History  Procedure Laterality Date  . Craniotomy Bilateral 10/16/2014    Procedure: BILATERALCRANIOTOMY HEMATOMA EVACUATION SUBDURAL;  Surgeon: Maeola HarmanJoseph Stern, MD;  Location: [facility];  Service: Neurosurgery;  Laterality: Bilateral;  BILATERALCRANIOTOMY HEMATOMA EVACUATION SUBDURAL     Prescriptions prior to admission  Medication Sig Dispense Refill Last Dose  . acetaminophen (TYLENOL) 500 MG tablet Take 500 mg by mouth every 6 (six) hours as needed for moderate pain.   prn at prn  . amLODipine (NORVASC) 10 MG tablet Take 1 tablet (10 mg total) by mouth daily. 30 tablet 1 11-01-2015 at 0630  . hydrALAZINE (APRESOLINE) 25 MG tablet Take 25 mg  by mouth 2 (two) times daily.   11-01-2015 at 0630  . lisinopril-hydrochlorothiazide (PRINZIDE,ZESTORETIC) 20-12.5 MG tablet Take 2 tablets by mouth daily.   11-01-2015 at 0630  . lovastatin (MEVACOR) 20 MG tablet Take 2 tablets (40 mg total) by mouth at bedtime. 30 tablet 1 03/10/2016 at Unknown time  . metFORMIN (GLUCOPHAGE) 1000 MG tablet Take 1 tablet (1,000 mg total) by mouth 2 (two) times daily with a meal. 60 tablet 1 11-01-2015 at 0630  . Multiple Vitamin (MULTIVITAMIN WITH MINERALS) TABS tablet Take 1 tablet by mouth daily.   unknown at unknown    Inpatient Medications:   Allergies: No Known Allergies  Social History   Social History  . Marital Status: Married    Spouse Name: N/A  . Number of Children: N/A  . Years of Education: N/A   Occupational History  . Not on file.   Social History Main Topics  . Smoking status: Former Games developermoker  . Smokeless tobacco: Not on file  . Alcohol Use: No  . Drug Use: Not on file  . Sexual Activity: Not on file   Other Topics Concern  . Not on file   Social History Narrative     No family history on file.   Review of Systems: All other systems reviewed and are otherwise negative except as noted above.  Physical Exam: BP - 100/72, P - 30 and irregular, R - 18 GEN- The patient is well  appearing, looks younger then his age, in NAD, alert and oriented x 3 today.   HEENT: normocephalic, atraumatic; sclera clear, conjunctiva pink; hearing intact; oropharynx clear; neck supple, no JVP Lymph- no cervical lymphadenopathy Lungs- Clear to ausculation bilaterally, normal work of breathing.  No wheezes, rales, rhonchi Heart- irregular, bradycardic, no significant murmurs, no rubs or gallops GI- soft, non-tender, non-distended, bowel sounds present Extremities- no clubbing, cyanosis, or edema MS- no significant deformity, age appropriate atrophy Skin- warm and dry, no rash or lesion Psych- euthymic mood, full affect Neuro- no gross deficits  observed  Labs:   Lab Results  Component Value Date   WBC 8.8 Mar 25, 2016   HGB 13.0 March 25, 2016   HCT 39.2* 2016/03/25   MCV 87.6 25-Mar-2016   PLT 209 2016-03-25     Recent Labs Lab 2016/03/25 0840  NA 138  K 3.9  CL 107  CO2 19*  BUN 31*  CREATININE 1.33*  CALCIUM 9.3  PROT 6.8  BILITOT 0.8  ALKPHOS 60  ALT 16*  AST 30  GLUCOSE 143*      Radiology/Studies:  Dg Chest Port 1 View 03/25/2016  CLINICAL DATA:  Bradycardia. EXAM: PORTABLE CHEST 1 VIEW COMPARISON:  10/17/2014 FINDINGS: Mild cardiomegaly. Lungs are clear. No effusions or acute bony abnormality. IMPRESSION: Mild cardiomegaly.  No active disease. Electronically Signed   By: Charlett Nose M.D.   On: Mar 25, 2016 08:41    EKG: AFib, SVR, PVCs TELEMETRY: AFib, 20's-30's, PVCs  10/17/14: Echocardiogram Study Conclusions - Left ventricle: The cavity size was mildly dilated. Wall thickness was normal. The estimated ejection fraction was 55%. Wall motion was normal; there were no regional wall motion abnormalities. - Aorta: Mild dilitation of the aortic root. Aortic root dimension: 41 mm (ED). - Left atrium: The atrium was moderately to severely dilated. - Right ventricle: The cavity size was normal. Systolic function was normal. - Right atrium: The atrium was mildly dilated.  Assessment and Plan:   1. Profound bradycardia     Symptomatic     No clear reversible causes, no rate limiting or nodal blocking agents on his home meds list     Plan for PPM implant today with Dr. Ladona Ridgel Risks, benefits, alternatives of PPM implant discussed with patient and family at bedside, the patient is agreeable to proceed  2. HTN     Will resume home meds post pacer  3. DM     AC/HS coverage, will hold home Metformin for now  4. Atrial fibrillation - he is not anti-coagulated but sounds like he may not be a cadidate with prior SDH.     [name], PA-C 03-25-2016 1:27 PM  EP Attending  Patient  seen and examined. IRIR rhythm with clear lungs and no murmurs and no edema and normal abdominal exam. Neuro is non-focal. Tele demonstrates atrial fib with CHB, and PVC's. A/P 1. CHB - I have discussed the indications, risk, benefits,goals, expectations of PPM insertion and he wishes to proceed. 2. Atrial fib - his rate is slow. He is not anti-coagulated. Will follow. 3. HTN - would consider adding a beta blocker after her undergoes PPM insertion. 4. SDH - neuro exam appears normal. He is not currently a candidate for anti-coagulation due to this. He might be so in the future.  Leonia Reeves.D.

## 2016-03-11 NOTE — ED Notes (Signed)
Pt left with carelink. Hr in 20s but stable otherwise upon leaving.

## 2016-03-11 NOTE — Interval H&P Note (Signed)
History and Physical Interval Note:  10/09/2015 1:51 PM  Mason [name] Kusch  has presented today for surgery, with the diagnosis of bradycardia  The various methods of treatment have been discussed with the patient and family. After consideration of risks, benefits and other options for treatment, the patient has consented to  Procedure(s): Pacemaker Implant (N/A) as a surgical intervention .  The patient's history has been reviewed, patient examined, no change in status, stable for surgery.  I have reviewed the patient's chart and labs.  Questions were answered to the patient's satisfaction.     Lewayne BuntingGregg Bunny Lowdermilk

## 2016-03-12 ENCOUNTER — Inpatient Hospital Stay (HOSPITAL_COMMUNITY): Payer: Medicare HMO

## 2016-03-12 DIAGNOSIS — I63312 Cerebral infarction due to thrombosis of left middle cerebral artery: Secondary | ICD-10-CM | POA: Insufficient documentation

## 2016-03-12 DIAGNOSIS — Z950 Presence of cardiac pacemaker: Secondary | ICD-10-CM | POA: Insufficient documentation

## 2016-03-12 DIAGNOSIS — J9601 Acute respiratory failure with hypoxia: Secondary | ICD-10-CM

## 2016-03-12 LAB — CBC WITH DIFFERENTIAL/PLATELET
BASOS PCT: 0 %
Basophils Absolute: 0 10*3/uL (ref 0.0–0.1)
EOS ABS: 0 10*3/uL (ref 0.0–0.7)
Eosinophils Relative: 0 %
HCT: 31.8 % — ABNORMAL LOW (ref 39.0–52.0)
Hemoglobin: 10.3 g/dL — ABNORMAL LOW (ref 13.0–17.0)
Lymphocytes Relative: 13 %
Lymphs Abs: 0.8 10*3/uL (ref 0.7–4.0)
MCH: 27.8 pg (ref 26.0–34.0)
MCHC: 32.4 g/dL (ref 30.0–36.0)
MCV: 85.7 fL (ref 78.0–100.0)
MONO ABS: 0.2 10*3/uL (ref 0.1–1.0)
MONOS PCT: 3 %
Neutro Abs: 5.5 10*3/uL (ref 1.7–7.7)
Neutrophils Relative %: 84 %
PLATELETS: 182 10*3/uL (ref 150–400)
RBC: 3.71 MIL/uL — ABNORMAL LOW (ref 4.22–5.81)
RDW: 15.9 % — AB (ref 11.5–15.5)
WBC: 6.5 10*3/uL (ref 4.0–10.5)

## 2016-03-12 LAB — LIPID PANEL
Cholesterol: 88 mg/dL (ref 0–200)
HDL: 37 mg/dL — ABNORMAL LOW (ref >40)
LDL Cholesterol: 42 mg/dL (ref 0–99)
Total CHOL/HDL Ratio: 2.4 ratio
Triglycerides: 47 mg/dL (ref <150)
VLDL: 9 mg/dL (ref 0–40)

## 2016-03-12 LAB — BLOOD GAS, ARTERIAL
Acid-base deficit: 4.2 mmol/L — ABNORMAL HIGH (ref 0.0–2.0)
Acid-base deficit: 1.8 mmol/L (ref 0.0–2.0)
BICARBONATE: 21.7 meq/L (ref 20.0–24.0)
Bicarbonate: 18.1 mEq/L — ABNORMAL LOW (ref 20.0–24.0)
DRAWN BY: 405301
DRAWN BY: 280981
FIO2: 100
FIO2: 0.7
LHR: 16 {breaths}/min
LHR: 12 {breaths}/min
MECHVT: 650 mL
O2 Saturation: 99.6 %
O2 Saturation: 98.3 %
PATIENT TEMPERATURE: 98.6
PCO2 ART: 21.7 mmHg — AB (ref 35.0–45.0)
PEEP/CPAP: 12 cmH2O
PEEP: 8 cmH2O
PO2 ART: 198 mmHg — AB (ref 80.0–100.0)
Patient temperature: 98.6
TCO2: 18.8 mmol/L (ref 0–100)
TCO2: 22.6 mmol/L (ref 0–100)
VT: 600 mL
pCO2 arterial: 31.7 mmHg — ABNORMAL LOW (ref 35.0–45.0)
pH, Arterial: 7.533 — ABNORMAL HIGH (ref 7.350–7.450)
pH, Arterial: 7.45 (ref 7.350–7.450)
pO2, Arterial: 115 mmHg — ABNORMAL HIGH (ref 80.0–100.0)

## 2016-03-12 LAB — BASIC METABOLIC PANEL
Anion gap: 9 (ref 5–15)
BUN: 29 mg/dL — AB (ref 6–20)
CHLORIDE: 111 mmol/L (ref 101–111)
CO2: 22 mmol/L (ref 22–32)
CREATININE: 1.44 mg/dL — AB (ref 0.61–1.24)
Calcium: 7.9 mg/dL — ABNORMAL LOW (ref 8.9–10.3)
GFR, EST AFRICAN AMERICAN: 49 mL/min — AB (ref >60)
GFR, EST NON AFRICAN AMERICAN: 42 mL/min — AB (ref >60)
Glucose, Bld: 98 mg/dL (ref 65–99)
Potassium: 3.4 mmol/L — ABNORMAL LOW (ref 3.5–5.1)
SODIUM: 142 mmol/L (ref 135–145)

## 2016-03-12 LAB — PHOSPHORUS
PHOSPHORUS: 3.2 mg/dL (ref 2.5–4.6)
PHOSPHORUS: 3.2 mg/dL (ref 2.5–4.6)

## 2016-03-12 LAB — MAGNESIUM
MAGNESIUM: 2.5 mg/dL — AB (ref 1.7–2.4)
Magnesium: 1.4 mg/dL — ABNORMAL LOW (ref 1.7–2.4)

## 2016-03-12 LAB — PROCALCITONIN: PROCALCITONIN: 10.41 ng/mL

## 2016-03-12 LAB — GLUCOSE, CAPILLARY
GLUCOSE-CAPILLARY: 95 mg/dL (ref 65–99)
GLUCOSE-CAPILLARY: 107 mg/dL — AB (ref 65–99)
Glucose-Capillary: 145 mg/dL — ABNORMAL HIGH (ref 65–99)
Glucose-Capillary: 77 mg/dL (ref 65–99)
Glucose-Capillary: 84 mg/dL (ref 65–99)

## 2016-03-12 LAB — ECHOCARDIOGRAM COMPLETE
Height: 71 in
Weight: 2560 oz

## 2016-03-12 MED ORDER — POTASSIUM CHLORIDE 10 MEQ/100ML IV SOLN
10.0000 meq | INTRAVENOUS | Status: AC
  Administered 2016-03-12 (×3): 10 meq via INTRAVENOUS
  Filled 2016-03-12 (×3): qty 100

## 2016-03-12 MED ORDER — CHLORHEXIDINE GLUCONATE 0.12% ORAL RINSE (MEDLINE KIT)
15.0000 mL | Freq: Two times a day (BID) | OROMUCOSAL | Status: DC
  Administered 2016-03-12 – 2016-03-13 (×3): 15 mL via OROMUCOSAL

## 2016-03-12 MED ORDER — PRO-STAT SUGAR FREE PO LIQD
30.0000 mL | Freq: Two times a day (BID) | ORAL | Status: DC
  Filled 2016-03-12: qty 30

## 2016-03-12 MED ORDER — VITAL HIGH PROTEIN PO LIQD: 1000.0000 mL | ORAL | Status: DC

## 2016-03-12 MED ORDER — POTASSIUM CHLORIDE 20 MEQ/15ML (10%) PO SOLN
40.0000 meq | Freq: Once | ORAL | Status: DC
  Filled 2016-03-12: qty 30

## 2016-03-12 MED ORDER — VITAL AF 1.2 CAL PO LIQD
1320.0000 mL | ORAL | Status: DC
  Administered 2016-03-12: 1320 mL
  Filled 2016-03-12 (×2): qty 2000

## 2016-03-12 MED ORDER — ANTISEPTIC ORAL RINSE SOLUTION (CORINZ)
7.0000 mL | OROMUCOSAL | Status: DC
  Administered 2016-03-12 – 2016-03-13 (×11): 7 mL via OROMUCOSAL

## 2016-03-12 MED ORDER — ASPIRIN 300 MG RE SUPP
300.0000 mg | Freq: Every day | RECTAL | Status: DC
  Administered 2016-03-12: 300 mg via RECTAL
  Filled 2016-03-12: qty 1

## 2016-03-12 MED ORDER — FUROSEMIDE 10 MG/ML IJ SOLN
INTRAMUSCULAR | Status: AC
  Filled 2016-03-12: qty 4

## 2016-03-12 MED ORDER — ASPIRIN EC 81 MG PO TBEC: 81.0000 mg | DELAYED_RELEASE_TABLET | Freq: Every day | ORAL | Status: DC

## 2016-03-12 MED ORDER — MAGNESIUM SULFATE 4 GM/100ML IV SOLN
4.0000 g | Freq: Once | INTRAVENOUS | Status: AC
  Administered 2016-03-12: 4 g via INTRAVENOUS
  Filled 2016-03-12: qty 100

## 2016-03-12 NOTE — Progress Notes (Addendum)
SUBJECTIVE: The patient is critically ill s/p stroke.  Remains in vent, responds mostly to pain.  Marland Kitchen  stroke: mapping our early stages of recovery book   Does not apply Once  . amLODipine  10 mg Oral Daily  . ampicillin-sulbactam (UNASYN) IV  3 g Intravenous Q8H  . antiseptic oral rinse  7 mL Mouth Rinse 10 times per day  . chlorhexidine gluconate (SAGE KIT)  15 mL Mouth Rinse BID  . famotidine  20 mg Oral BID  . furosemide  80 mg Intravenous Once  . hydrALAZINE  25 mg Oral BID  . lisinopril  20 mg Oral Daily   And  . hydrochlorothiazide  12.5 mg Oral Daily  . insulin aspart  0-15 Units Subcutaneous Q4H  . magnesium sulfate 1 - 4 g bolus IVPB  4 g Intravenous Once  . potassium chloride  10 mEq Intravenous Q1 Hr x 3  . potassium chloride  40 mEq Per Tube Once  . pravastatin  20 mg Oral q1800   . sodium chloride 50 mL/hr at 03/12/16 1133  . feeding supplement (VITAL AF 1.2 CAL)    . fentaNYL infusion INTRAVENOUS 25 mcg/hr (03/12/16 0421)  . propofol (DIPRIVAN) infusion Stopped (03/12/16 0135)    OBJECTIVE: Physical Exam: Filed Vitals:   03/12/16 1000 03/12/16 1100 03/12/16 1200 03/12/16 1209  BP: 127/60 106/77 125/57 125/57  Pulse: 60 60 60 60  Temp:      TempSrc:      Resp: 26 26 25 28   Height:      SpO2: 100% 100% 100% 100%    Intake/Output Summary (Last 24 hours) at 03/12/16 1333 Last data filed at 03/12/16 1100  Gross per 24 hour  Intake 2938.66 ml  Output   1435 ml  Net 1503.66 ml    Telemetry reveals afib, V paced  GEN- The patient is ill appearing, sedated on vent.   Head- normocephalic, atraumatic Eyes-  Sclera clear, conjunctiva pink Ears- unable to assess Oropharynx- ETT in place Lungs- Clear to ausculation bilaterally, normal work of breathing Heart- Regular rate and rhythm (paced) GI- soft, NT, ND, + BS Extremities- no clubbing, cyanosis, or edema Skin- pacemaker site is without hematoma  LABS: Basic Metabolic Panel:  Recent Labs   03/01/2016 0840  03/16/2016 1807 03/06/2016 1836 03/12/16 0515 03/12/16 0822  NA 138  < > 144 142 142  --   K 3.9  < > 4.1 4.0 3.4*  --   CL 107  --   --   --  111  --   CO2 19*  --   --   --  22  --   GLUCOSE 143*  --  165*  --  98  --   BUN 31*  --   --   --  29*  --   CREATININE 1.33*  --   --   --  1.44*  --   CALCIUM 9.3  --   --   --  7.9*  --   MG  --   --   --   --   --  1.4*  PHOS  --   --   --   --   --  3.2  < > = values in this interval not displayed. Liver Function Tests:  Recent Labs  03/03/2016 0840  AST 30  ALT 16*  ALKPHOS 60  BILITOT 0.8  PROT 6.8  ALBUMIN 3.8   No results for input(s): LIPASE, AMYLASE in the  last 72 hours. CBC:  Recent Labs  03/04/2016 0840  03/28/2016 1836 03/12/16 0515  WBC 8.8  --   --  6.5  NEUTROABS  --   --   --  5.5  HGB 13.0  < > 11.6* 10.3*  HCT 39.2*  < > 34.0* 31.8*  MCV 87.6  --   --  85.7  PLT 209  --   --  182  < > = values in this interval not displayed. Cardiac Enzymes:  Recent Labs  02/28/2016 0840  TROPONINI 0.09*   Fasting Lipid Panel:  Recent Labs  03/12/16 0515  CHOL 88  HDL 37*  LDLCALC 42  TRIG 47  CHOLHDL 2.4   RADIOLOGY: Ct Angio Head W Or Wo Contrast  03/02/2016  CLINICAL DATA:  Stroke code. EXAM: CT ANGIOGRAPHY HEAD AND NECK TECHNIQUE: Multidetector CT imaging of the head and neck was performed using the standard protocol during bolus administration of intravenous contrast. Multiplanar CT image reconstructions and MIPs were obtained to evaluate the vascular anatomy. Carotid stenosis measurements (when applicable) are obtained utilizing NASCET criteria, using the distal internal carotid diameter as the denominator. CONTRAST:  50 cc Isovue 370. COMPARISON:  None. FINDINGS: CTA NECK Aortic arch: Mild calcific atherosclerosis of the aortic arch. Right carotid system: Moderate plaque at the bifurcation with moderate 50% stenosis of the proximal right internal carotid artery. Left carotid system: Mild plaque at  the bifurcation without hemodynamically significant stenosis. Vertebral arteries:No appreciable right vertebral artery from the origin to the C3 level. The vertebral artery above the C3 level is irregular and attenuated with a short segment of severe stenosis and calcification at the craniocervical junction. The left vertebral artery is widely patent. Skeleton: Moderate multilevel cervical spondylosis greatest at the C5-C7 levels with prominent right-sided facet arthrosis component. No acute osseous abnormality is identified. Other neck: Ground-glass opacities and septal thickening within the lung apices probably reflects pulmonary edema. Superimposed pneumonia is not excluded. Debris within the upper esophagus. No discrete cervical mass or lymphadenopathy is identified. The thyroid gland is unremarkable. The aerodigestive tract is patent without a discrete exophytic mass identified. Left-sided pacemaker with leads extending below the field of view in the superior vena cava. CTA HEAD Anterior circulation: Attenuation of proximal left M1 with distal occlusion and no opacification superior or inferior M2 divisions. Poor left MCA collateralization. Bilateral ACA and right MCA are patent. There are short segments of mild stenosis in the right proximal MCA. Bilateral internal carotid arteries are patent. Mild calcific atherosclerosis of cavernous segments without significant stenosis. Small anterior communicating artery. Small left posterior communicating artery possible diminutive right posterior communicating artery. Posterior circulation: Left dominant vertebrobasilar system. Patent vertebral and basilar arteries. Bilateral posterior cerebral arteries are patent. Short segment of moderate stenosis within the proximal basilar artery. Mild irregularity of bilateral proximal posterior cerebral arteries with areas of mild stenosis likely related to atherosclerosis. Venous sinuses: No venous sinus thrombosis is identified.  Anatomic variants: None. Calvarium and skull base: Postsurgical changes related to bilateral frontal craniotomies again seen. The calvarium is otherwise unremarkable. IMPRESSION: 1. Left M1 occlusion with poor collateralization of the left MCA distribution. No additional large vessel occlusion is identified. 2. Intracranial atherosclerosis with areas of mild-to-moderate stenosis of the anterior and posterior circulation. No aneurysm is identified. 3. Proximal occlusion of the right vertebral artery of the neck and irregularity with calcifications of the reconstituted upper right vertebral artery of the neck is probably on a chronic basis and related to atherosclerosis.  4. Moderate 50% stenosis of the proximal right internal carotid artery at the bifurcation secondary to calcified plaque. 5. Ground-glass opacities and septal thickening in the lung apices probably represents pulmonary edema, superimposed pneumonia is not excluded. Critical Value/emergent results were called by telephone at the time of interpretation on 03/17/2016 at 4:47 pm to Dr. Etta Quill , who verbally acknowledged these results. Electronically Signed   By: Kristine Garbe M.D.   On: 03/04/2016 17:03   Ct Head Wo Contrast  03/25/2016  CLINICAL DATA:  Status post revascularization of the left middle cerebral artery. Previous bilateral subdural hematomas. EXAM: CT HEAD WITHOUT CONTRAST TECHNIQUE: Contiguous axial images were obtained from the base of the skull through the vertex without intravenous contrast. COMPARISON:  CT head and CTA head from the same day. CT head without contrast 01/07/2015 FINDINGS: The left insular cortex is partially obscured. The basal ganglia are intact. No other focal cortical infarct is present. The thalami are intact. Remote infarcts of the right cerebellum are stable. No acute hemorrhage is present. Small bilateral subdural collections are noted over the posterior convexities. These are likely chronic.  Patient is intubated. The OG tube is in place. The paranasal sinuses mastoid air cells are clear. The calvarium is intact. Bilateral craniotomies are associated with previous subdural evacuation sub. The globes and orbits are within normal limits. IMPRESSION: 1. The left inferior insular cortex is partially obscured. There may be a small acute infarct in this area. 2. No other acute infarct.  The basal ganglia are intact. 3. Small posterior bilateral subdural collections. These likely represents residua of prior large subdural hematomas. ASPECTS score = 9/10 [place] Stroke Program Early CT Score Normal score = 10 Electronically Signed   By: San Morelle M.D.   On: 03/09/2016 20:03   Ct Head Wo Contrast  03/05/2016  CLINICAL DATA:  Code stroke. EXAM: CT HEAD WITHOUT CONTRAST TECHNIQUE: Contiguous axial images were obtained from the base of the skull through the vertex without intravenous contrast. COMPARISON:  01/07/2015 FINDINGS: Brain: Markedly diminished exam detail secondary to motion artifact. Patient moving during examination. Unable to maintain stillness. No evidence of acute infarction, hemorrhage, extra-axial collection, ventriculomegaly, or mass effect. Vascular: No hyperdense vessel or unexpected calcification. Skull: Bilateral craniotomies have been performed. Sinuses/Orbits: No acute findings. Other: None. IMPRESSION: 1. No acute hemorrhage or large non-hemorrhagic infarct is present. Smaller nonhemorrhagic infarcts may be obscured by patient motion. Electronically Signed   By: Kerby Moors M.D.   On: 03/07/2016 16:28   Ct Angio Neck W Or Wo Contrast  03/28/2016  CLINICAL DATA:  Stroke code. EXAM: CT ANGIOGRAPHY HEAD AND NECK TECHNIQUE: Multidetector CT imaging of the head and neck was performed using the standard protocol during bolus administration of intravenous contrast. Multiplanar CT image reconstructions and MIPs were obtained to evaluate the vascular anatomy. Carotid stenosis  measurements (when applicable) are obtained utilizing NASCET criteria, using the distal internal carotid diameter as the denominator. CONTRAST:  50 cc Isovue 370. COMPARISON:  None. FINDINGS: CTA NECK Aortic arch: Mild calcific atherosclerosis of the aortic arch. Right carotid system: Moderate plaque at the bifurcation with moderate 50% stenosis of the proximal right internal carotid artery. Left carotid system: Mild plaque at the bifurcation without hemodynamically significant stenosis. Vertebral arteries:No appreciable right vertebral artery from the origin to the C3 level. The vertebral artery above the C3 level is irregular and attenuated with a short segment of severe stenosis and calcification at the craniocervical junction. The left vertebral artery is  widely patent. Skeleton: Moderate multilevel cervical spondylosis greatest at the C5-C7 levels with prominent right-sided facet arthrosis component. No acute osseous abnormality is identified. Other neck: Ground-glass opacities and septal thickening within the lung apices probably reflects pulmonary edema. Superimposed pneumonia is not excluded. Debris within the upper esophagus. No discrete cervical mass or lymphadenopathy is identified. The thyroid gland is unremarkable. The aerodigestive tract is patent without a discrete exophytic mass identified. Left-sided pacemaker with leads extending below the field of view in the superior vena cava. CTA HEAD Anterior circulation: Attenuation of proximal left M1 with distal occlusion and no opacification superior or inferior M2 divisions. Poor left MCA collateralization. Bilateral ACA and right MCA are patent. There are short segments of mild stenosis in the right proximal MCA. Bilateral internal carotid arteries are patent. Mild calcific atherosclerosis of cavernous segments without significant stenosis. Small anterior communicating artery. Small left posterior communicating artery possible diminutive right posterior  communicating artery. Posterior circulation: Left dominant vertebrobasilar system. Patent vertebral and basilar arteries. Bilateral posterior cerebral arteries are patent. Short segment of moderate stenosis within the proximal basilar artery. Mild irregularity of bilateral proximal posterior cerebral arteries with areas of mild stenosis likely related to atherosclerosis. Venous sinuses: No venous sinus thrombosis is identified. Anatomic variants: None. Calvarium and skull base: Postsurgical changes related to bilateral frontal craniotomies again seen. The calvarium is otherwise unremarkable. IMPRESSION: 1. Left M1 occlusion with poor collateralization of the left MCA distribution. No additional large vessel occlusion is identified. 2. Intracranial atherosclerosis with areas of mild-to-moderate stenosis of the anterior and posterior circulation. No aneurysm is identified. 3. Proximal occlusion of the right vertebral artery of the neck and irregularity with calcifications of the reconstituted upper right vertebral artery of the neck is probably on a chronic basis and related to atherosclerosis. 4. Moderate 50% stenosis of the proximal right internal carotid artery at the bifurcation secondary to calcified plaque. 5. Ground-glass opacities and septal thickening in the lung apices probably represents pulmonary edema, superimposed pneumonia is not excluded. Critical Value/emergent results were called by telephone at the time of interpretation on 03/09/2016 at 4:47 pm to Dr. Etta Quill , who verbally acknowledged these results. Electronically Signed   By: Kristine Garbe M.D.   On: 03/24/2016 17:03   Portable Chest Xray  03/12/2016  CLINICAL DATA:  82 year old male with a history of respiratory distress EXAM: PORTABLE CHEST 1 VIEW COMPARISON:  03/10/2016, 10/17/2014 FINDINGS: Cardiomediastinal silhouette unchanged in size and contour. Persisting bilateral mixed interstitial and airspace opacities, on the right  predominantly upper lung, on the left, predominantly lower lung. Unchanged endotracheal tube, terminating above the carina 4.9 cm. Unchanged cardiac pacing device on left chest wall. Unchanged gastric tube terminating out of the field of view. No pneumothorax. IMPRESSION: Similar appearance of bilateral interstitial and airspace disease. Unchanged endotracheal tube and gastric tube. Signed, Dulcy Fanny. Earleen Newport, DO [facility] Electronically Signed   By: Corrie Mckusick D.O.   On: 03/12/2016 09:47   Dg Chest Port 1 View  03/03/2016  CLINICAL DATA:  Acute respiratory failure. Intubated. Complete heart block. EXAM: PORTABLE CHEST 1 VIEW COMPARISON:  Earlier today. FINDINGS: Interval endotracheal tube in satisfactory position. Normal sized heart. Interval patchy airspace opacity throughout the majority of the right lung and in the left perihilar region. Possible minimal right pleural fluid. Thoracic spine degenerative changes. Interval left subclavian bipolar pacemaker with the leads in satisfactory position. No pneumothorax. Interval nasogastric tube extending into the stomach. IMPRESSION: Interval probable  bilateral alveolar edema. Pneumonia is less likely. Electronically Signed   By: Claudie Revering M.D.   On: 02/28/2016 17:10   Dg Chest Port 1 View  03/12/2016  CLINICAL DATA:  Bradycardia. EXAM: PORTABLE CHEST 1 VIEW COMPARISON:  10/17/2014 FINDINGS: Mild cardiomegaly. Lungs are clear. No effusions or acute bony abnormality. IMPRESSION: Mild cardiomegaly.  No active disease. Electronically Signed   By: Rolm Baptise M.D.   On: 03/25/2016 08:41    ASSESSMENT AND PLAN:  Active Problems:   Bradycardia   CHB (complete heart block) (HCC)   Symptomatic bradycardia   Acute respiratory failure (HCC)   Stroke [facility])   Cerebrovascular accident (CVA) due to thrombosis of left middle cerebral artery ([place])   Pacemaker  1. Complete heart block Patient presented  in extremis with near agonal heart rhythm on transfer from outside hospital S/p successful ppm by Dr Lovena Le Device interrogation is reviewed with S[facility] today and is normal CXR reveals no ptx, stable leads Will repeat interrogation on Monday   2. Permanent atrial fibrillation Very difficult situation given prior SDH.  Upon discussions with family, he had traumatic SDH at that time.  They state that he does not fall typically and continued to be very active up until his current hospitalization.  Given chads2vasc score of at least 6, he is at very high risk for recurrent stroke in the future.  On ASA for now given acute stroke.  Hopefully he will be a candidate for NOAC therapy long term.  This will need to be determined by neurology. Currently rate controlled.  3. Acute stroke Pt with embolic stroke likely secondary to permanent atrial fibrillation for which he was not previously anticoagulated due to prior SDH.   I do not feel that this is secondary to ppm implantation.  It could be that his agonal heart rhythm for hours (prior to ppm implant) predisposed him to a transient increase in stroke risk.  Will defer management to stroke team. As above, hopefully he will be a candidate for NOAC therapy long term as his risk for recurrence is quite high.  4. Hypomagnesemia/ hypokalemia Replete    Thompson Grayer, MD 03/12/2016 1:33 PM

## 2016-03-12 NOTE — Progress Notes (Signed)
PULMONARY / CRITICAL CARE MEDICINE   Name: Mason Stevens MRN: [id number] DOB: [birth year]    ADMISSION DATE:  03/19/2016 CONSULTATION DATE:  03/26/2016  REFERRING MD:  Dr. Ladona Ridgel   CHIEF COMPLAINT:  Respiratory Distress   HISTORY OF PRESENT ILLNESS:  82 year old male with a past medical history of BPH, peripheral neuropathy, atrial fibrillation (diagnosed 09/2014, not on anticoagulation secondary to falls) prior SDH status post evacuation, HTN, DM who presented to [facility] ER on 7/14 with complaints of "feeling funny". Found to be profoundly bradycardic with HR 20's. He was tx to [facility] for pacemaker placement. During pacemaker placement he was noted to have a change in work of breathing, AMS and decreased movement on the right. Pacemaker was placed. Emergent CT of the head showed no acute hemorrhage or nonhemorrhagic infarct. Per stroke RN, CT angiogram of the head and neck showed concerns for distal M1 infarct.  He was emergently returned to ICU for intubation in the setting of respiratory distress for airway protection.  Overnight 7/14 he underwent IR revascularization of occluded L MCA.    SUBJECTIVE/Overnight:  Febrile 102.  Propofol weaned off for mild hypotension.  Remains on very low dose fent gtt.  Moves spontaneously L>R, not following commands.   VITAL SIGNS: BP 109/58 mmHg  Pulse 60  Temp(Src) 99 F (37.2 C) (Axillary)  Resp 24  SpO2 100%  HEMODYNAMICS:    VENTILATOR SETTINGS: Vent Mode:  [-] PRVC FiO2 (%):  [80 %-100 %] 80 % Set Rate:  [16 bmp] 16 bmp Vt Set:  [600 mL-650 mL] 600 mL PEEP:  [10 cmH20-12 cmH20] 10 cmH20 Plateau Pressure:  [23 cmH20-25 cmH20] 23 cmH20  INTAKE / OUTPUT: I/O last 3 completed shifts: In: 2638.7 [I.V.:2438.7; IV Piggyback:200] Out: 1435 [Urine:1435]  PHYSICAL EXAMINATION: General:  Elderly male, NAD on vent  Neuro:  Moves spontaneously L>R, does not move RUE, does not follow commands, does not open eyes, pupils 3mm sluggish   HEENT:  MM pink/moist, no JVD, ETT Cardiovascular:  s1s2 distant due to adventitous breath sounds Lungs:  resps even non labored on vent, breathes over set rate, coarse, diminished bases  Abdomen:  Soft, BSx4 active Musculoskeletal:  No acute deformities, left upper extremity in sling post pacemaker placement, R groin dressing c/d, groin soft  Skin:  Diaphoretic, pale, no edema  LABS:  BMET  Recent Labs Lab 03/17/2016 0840  03/10/2016 1807 03/10/2016 1836 03/12/16 0515  NA 138  < > 144 142 142  K 3.9  < > 4.1 4.0 3.4*  CL 107  --   --   --  111  CO2 19*  --   --   --  22  BUN 31*  --   --   --  29*  CREATININE 1.33*  --   --   --  1.44*  GLUCOSE 143*  --  165*  --  98  < > = values in this interval not displayed.  Electrolytes  Recent Labs Lab 02/29/2016 0840 03/12/16 0515  CALCIUM 9.3 7.9*    CBC  Recent Labs Lab 03/09/2016 0840  02/28/2016 1807 02/28/2016 1836 03/12/16 0515  WBC 8.8  --   --   --  6.5  HGB 13.0  < > 10.5* 11.6* 10.3*  HCT 39.2*  < > 31.0* 34.0* 31.8*  PLT 209  --   --   --  182  < > = values in this interval not displayed.  Coag's  Recent Labs Lab 03/18/2016 0840  APTT 33  INR 0.99    Sepsis Markers  Recent Labs Lab 03/10/2016 1705  PROCALCITON 0.39    ABG  Recent Labs Lab 03/05/2016 1836 03/04/2016 2202 03/12/16 0629  PHART 7.357 7.309* 7.533*  PCO2ART 32.5* 34.6* 21.7*  PO2ART 66.0* 44.0* 198*    Liver Enzymes  Recent Labs Lab 03/27/2016 0840  AST 30  ALT 16*  ALKPHOS 60  BILITOT 0.8  ALBUMIN 3.8    Cardiac Enzymes  Recent Labs Lab 03/17/2016 0840  TROPONINI 0.09*    Glucose  Recent Labs Lab 03/26/2016 1548 03/06/2016 2122 02/29/2016 2337 03/12/16 0349  GLUCAP 168* 139* 189* 145*    Imaging Ct Angio Head W Or Wo Contrast  03/25/2016  CLINICAL DATA:  Stroke code. EXAM: CT ANGIOGRAPHY HEAD AND NECK TECHNIQUE: Multidetector CT imaging of the head and neck was performed using the standard protocol during bolus  administration of intravenous contrast. Multiplanar CT image reconstructions and MIPs were obtained to evaluate the vascular anatomy. Carotid stenosis measurements (when applicable) are obtained utilizing NASCET criteria, using the distal internal carotid diameter as the denominator. CONTRAST:  50 cc Isovue 370. COMPARISON:  None. FINDINGS: CTA NECK Aortic arch: Mild calcific atherosclerosis of the aortic arch. Right carotid system: Moderate plaque at the bifurcation with moderate 50% stenosis of the proximal right internal carotid artery. Left carotid system: Mild plaque at the bifurcation without hemodynamically significant stenosis. Vertebral arteries:No appreciable right vertebral artery from the origin to the C3 level. The vertebral artery above the C3 level is irregular and attenuated with a short segment of severe stenosis and calcification at the craniocervical junction. The left vertebral artery is widely patent. Skeleton: Moderate multilevel cervical spondylosis greatest at the C5-C7 levels with prominent right-sided facet arthrosis component. No acute osseous abnormality is identified. Other neck: Ground-glass opacities and septal thickening within the lung apices probably reflects pulmonary edema. Superimposed pneumonia is not excluded. Debris within the upper esophagus. No discrete cervical mass or lymphadenopathy is identified. The thyroid gland is unremarkable. The aerodigestive tract is patent without a discrete exophytic mass identified. Left-sided pacemaker with leads extending below the field of view in the superior vena cava. CTA HEAD Anterior circulation: Attenuation of proximal left M1 with distal occlusion and no opacification superior or inferior M2 divisions. Poor left MCA collateralization. Bilateral ACA and right MCA are patent. There are short segments of mild stenosis in the right proximal MCA. Bilateral internal carotid arteries are patent. Mild calcific atherosclerosis of cavernous  segments without significant stenosis. Small anterior communicating artery. Small left posterior communicating artery possible diminutive right posterior communicating artery. Posterior circulation: Left dominant vertebrobasilar system. Patent vertebral and basilar arteries. Bilateral posterior cerebral arteries are patent. Short segment of moderate stenosis within the proximal basilar artery. Mild irregularity of bilateral proximal posterior cerebral arteries with areas of mild stenosis likely related to atherosclerosis. Venous sinuses: No venous sinus thrombosis is identified. Anatomic variants: None. Calvarium and skull base: Postsurgical changes related to bilateral frontal craniotomies again seen. The calvarium is otherwise unremarkable. IMPRESSION: 1. Left M1 occlusion with poor collateralization of the left MCA distribution. No additional large vessel occlusion is identified. 2. Intracranial atherosclerosis with areas of mild-to-moderate stenosis of the anterior and posterior circulation. No aneurysm is identified. 3. Proximal occlusion of the right vertebral artery of the neck and irregularity with calcifications of the reconstituted upper right vertebral artery of the neck is probably on a chronic basis and related to atherosclerosis. 4. Moderate 50% stenosis of the proximal right internal  carotid artery at the bifurcation secondary to calcified plaque. 5. Ground-glass opacities and septal thickening in the lung apices probably represents pulmonary edema, superimposed pneumonia is not excluded. Critical Value/emergent results were called by telephone at the time of interpretation on 03/23/2016 at 4:47 pm to Dr. Felicie Morn , who verbally acknowledged these results. Electronically Signed   By: Mitzi Hansen M.D.   On: 03/01/2016 17:03   Ct Head Wo Contrast  03/28/2016  CLINICAL DATA:  Status post revascularization of the left middle cerebral artery. Previous bilateral subdural hematomas. EXAM:  CT HEAD WITHOUT CONTRAST TECHNIQUE: Contiguous axial images were obtained from the base of the skull through the vertex without intravenous contrast. COMPARISON:  CT head and CTA head from the same day. CT head without contrast 01/07/2015 FINDINGS: The left insular cortex is partially obscured. The basal ganglia are intact. No other focal cortical infarct is present. The thalami are intact. Remote infarcts of the right cerebellum are stable. No acute hemorrhage is present. Small bilateral subdural collections are noted over the posterior convexities. These are likely chronic. Patient is intubated. The OG tube is in place. The paranasal sinuses mastoid air cells are clear. The calvarium is intact. Bilateral craniotomies are associated with previous subdural evacuation sub. The globes and orbits are within normal limits. IMPRESSION: 1. The left inferior insular cortex is partially obscured. There may be a small acute infarct in this area. 2. No other acute infarct.  The basal ganglia are intact. 3. Small posterior bilateral subdural collections. These likely represents residua of prior large subdural hematomas. ASPECTS score = 9/10 [place] Stroke Program Early CT Score Normal score = 10 Electronically Signed   By: Marin Roberts M.D.   On: 02/28/2016 20:03   Ct Head Wo Contrast  03/02/2016  CLINICAL DATA:  Code stroke. EXAM: CT HEAD WITHOUT CONTRAST TECHNIQUE: Contiguous axial images were obtained from the base of the skull through the vertex without intravenous contrast. COMPARISON:  01/07/2015 FINDINGS: Brain: Markedly diminished exam detail secondary to motion artifact. Patient moving during examination. Unable to maintain stillness. No evidence of acute infarction, hemorrhage, extra-axial collection, ventriculomegaly, or mass effect. Vascular: No hyperdense vessel or unexpected calcification. Skull: Bilateral craniotomies have been performed. Sinuses/Orbits: No acute findings. Other: None. IMPRESSION: 1.  No acute hemorrhage or large non-hemorrhagic infarct is present. Smaller nonhemorrhagic infarcts may be obscured by patient motion. Electronically Signed   By: Signa Kell M.D.   On: 03/13/2016 16:28   Ct Angio Neck W Or Wo Contrast  03/28/2016  CLINICAL DATA:  Stroke code. EXAM: CT ANGIOGRAPHY HEAD AND NECK TECHNIQUE: Multidetector CT imaging of the head and neck was performed using the standard protocol during bolus administration of intravenous contrast. Multiplanar CT image reconstructions and MIPs were obtained to evaluate the vascular anatomy. Carotid stenosis measurements (when applicable) are obtained utilizing NASCET criteria, using the distal internal carotid diameter as the denominator. CONTRAST:  50 cc Isovue 370. COMPARISON:  None. FINDINGS: CTA NECK Aortic arch: Mild calcific atherosclerosis of the aortic arch. Right carotid system: Moderate plaque at the bifurcation with moderate 50% stenosis of the proximal right internal carotid artery. Left carotid system: Mild plaque at the bifurcation without hemodynamically significant stenosis. Vertebral arteries:No appreciable right vertebral artery from the origin to the C3 level. The vertebral artery above the C3 level is irregular and attenuated with a short segment of severe stenosis and calcification at the craniocervical junction. The left vertebral artery is widely patent. Skeleton: Moderate multilevel cervical spondylosis greatest at  the C5-C7 levels with prominent right-sided facet arthrosis component. No acute osseous abnormality is identified. Other neck: Ground-glass opacities and septal thickening within the lung apices probably reflects pulmonary edema. Superimposed pneumonia is not excluded. Debris within the upper esophagus. No discrete cervical mass or lymphadenopathy is identified. The thyroid gland is unremarkable. The aerodigestive tract is patent without a discrete exophytic mass identified. Left-sided pacemaker with leads extending  below the field of view in the superior vena cava. CTA HEAD Anterior circulation: Attenuation of proximal left M1 with distal occlusion and no opacification superior or inferior M2 divisions. Poor left MCA collateralization. Bilateral ACA and right MCA are patent. There are short segments of mild stenosis in the right proximal MCA. Bilateral internal carotid arteries are patent. Mild calcific atherosclerosis of cavernous segments without significant stenosis. Small anterior communicating artery. Small left posterior communicating artery possible diminutive right posterior communicating artery. Posterior circulation: Left dominant vertebrobasilar system. Patent vertebral and basilar arteries. Bilateral posterior cerebral arteries are patent. Short segment of moderate stenosis within the proximal basilar artery. Mild irregularity of bilateral proximal posterior cerebral arteries with areas of mild stenosis likely related to atherosclerosis. Venous sinuses: No venous sinus thrombosis is identified. Anatomic variants: None. Calvarium and skull base: Postsurgical changes related to bilateral frontal craniotomies again seen. The calvarium is otherwise unremarkable. IMPRESSION: 1. Left M1 occlusion with poor collateralization of the left MCA distribution. No additional large vessel occlusion is identified. 2. Intracranial atherosclerosis with areas of mild-to-moderate stenosis of the anterior and posterior circulation. No aneurysm is identified. 3. Proximal occlusion of the right vertebral artery of the neck and irregularity with calcifications of the reconstituted upper right vertebral artery of the neck is probably on a chronic basis and related to atherosclerosis. 4. Moderate 50% stenosis of the proximal right internal carotid artery at the bifurcation secondary to calcified plaque. 5. Ground-glass opacities and septal thickening in the lung apices probably represents pulmonary edema, superimposed pneumonia is not  excluded. Critical Value/emergent results were called by telephone at the time of interpretation on 2016-03-12 at 4:47 pm to Dr. Felicie Morn , who verbally acknowledged these results. Electronically Signed   By: Mitzi Hansen M.D.   On: 03/12/16 17:03   Dg Chest Port 1 View  Mar 12, 2016  CLINICAL DATA:  Acute respiratory failure. Intubated. Complete heart block. EXAM: PORTABLE CHEST 1 VIEW COMPARISON:  Earlier today. FINDINGS: Interval endotracheal tube in satisfactory position. Normal sized heart. Interval patchy airspace opacity throughout the majority of the right lung and in the left perihilar region. Possible minimal right pleural fluid. Thoracic spine degenerative changes. Interval left subclavian bipolar pacemaker with the leads in satisfactory position. No pneumothorax. Interval nasogastric tube extending into the stomach. IMPRESSION: Interval probable bilateral alveolar edema. Pneumonia is less likely. Electronically Signed   By: Beckie Salts M.D.   On: 12-Mar-2016 17:10   Dg Chest Port 1 View  Mar 12, 2016  CLINICAL DATA:  Bradycardia. EXAM: PORTABLE CHEST 1 VIEW COMPARISON:  10/17/2014 FINDINGS: Mild cardiomegaly. Lungs are clear. No effusions or acute bony abnormality. IMPRESSION: Mild cardiomegaly.  No active disease. Electronically Signed   By: Charlett Nose M.D.   On: 2016-03-12 08:41     STUDIES:  CT head 7/14 >> no acute hemorrhage CTA head/neck 7/14 >> distal M1 infarct  CULTURES:   ANTIBIOTICS:   SIGNIFICANT EVENTS: 7/14  Admit to [facility] with symptomatically bradycardia.  Developed right-sided weakness and rest for distress during cath, CTA of the head concerning for distal M1 infarct.  To  neuro IR 7/14 IR revascularization of occluded L MCA  LINES/TUBES: ETT 7/14 >>  DISCUSSION: 82 year old male with a past medical history of atrial fibrillation not on anticoagulation, diabetes and hypertension who was admitted 7/14 with symptomatically bradycardia S/P PPM who  developed right-sided weakness and respiratory distress during procedure. Emergent CT of the head concerning for distal M1 infarct.  Intubated emergently post CT.  ASSESSMENT / PLAN:  PULMONARY A: Acute hypoxic respiratory failure  Concern for pulmonary edema / CHF Respiratory alkalosis  P:   PRBC 8 cc/KG Wean PEEP and FiO2 for sats greater than 92% PCXR pending from this am  Will decrease RR on vent but pt breathing over set rate - may need increased sedation  Intermittent CXR Albuterol PRN  F/u ABG   NEUROLOGIC A:   Acute encephalopathy / right-sided weakness  Distal L MCA infarct - s/p IR revascularization  P:   RASS goal: 0 to -1 Fentanyl drip  PRN Versed for sedation Neuro following    CARDIOVASCULAR A:  Symptomatically bradycardia status post PPM 7/14 Concern for CHF - 2/16 LVEF 55% Hx HTN, atrial fib not on any coagulation P:  ICU monitoring of hemodynamics Repeat echo pending  PPM care per cardiology Hypertension control Monitor troponin Hold further diuresis for now with intermittent hypotension - goal SBP 120-140 Hold amlodipine if ongoing hypotension   RENAL A:   AKI - setting of symptomatically bradycardia, hypertension Hypokalemia  P:   Trend BMP/UOP Place electrolytes as indicated  GASTROINTESTINAL A:   At risk protein calorie malnutrition P:   TF per nutrition  Pepcid for stress ulcer prophylaxis  HEMATOLOGIC A:   No acute issues Not an anti-coagulation candidate due to history of falls and SDH P:  Trend CBC SCDs for DVT prophylaxis  INFECTIOUS A:   Asymmetric infiltrates - concern for aspiration Fever  P:   Monitor fever curve/WBC Empiric Unasyn for aspiration Trend PCT  ENDOCRINE A:   Diabetes mellitus  P:   Sliding scale insulin Q4   FAMILY  - Updates: Family updated at bedside per Dr. Delton CoombesByrum 7/14, no family at bedside 7/15  - Inter-disciplinary family meet or Palliative Care meeting due by:  7/21    Dirk DressKaty  Whiteheart, NP 03/12/2016  8:00 AM Pager: [phone] or [phone]  Attending note: Remains on increased PEEP/FiO2.  RASS -2.  HR regular.  Scattered crackles.  Abd soft.  WBC 6.5, Procalcitonin 10.41.  CXR with b/l ASD  Assessment/plan: Acute hypoxic respiratory failure. Aspiration PNA. - full vent support - continue Abx  CVA. - per neurology  Bradycardia s/p PM. Hx of HTN, A fib. - per cardiology  Updated pt's family at bedside.  CC time by me independent of APP time 31 minutes.  Coralyn HellingVineet Saleen Peden, MD [facility] Pulmonary/Critical Care 03/12/2016, 11:29 AM Pager:  [phone] After 3pm call: [phone]

## 2016-03-12 NOTE — Progress Notes (Signed)
STROKE TEAM PROGRESS NOTE   HISTORY OF PRESENT ILLNESS (per record) Mason Stevens is s/p placement of a pacemaker. It was immediately noted that he had a dense right hemiplegia. The code team was urgently activated. He was taken to the CT suite where CT revealed slight effacement of the insular cortical region. The CTA was indicative of an occluded left M1. TPA was considered and discussed with the family. TPA was not performed as the patient has a recent history of bilateral craniotomy for SDH. In addition, the patient was noted to have complete right sided paralysis suggestive of severe CVA. There was some evidence of edema with some effacement of the insular cortex.  The IR team was consulted and felt intervention was indicated. Prior to IR the patient was taken to the cardiac ICU. After consultation with the ICU attending, it was felt best that [facility] be stabilized and intubated in a controlled ICU setting prior to IR.  The case, risks, and benefits were discussed with all available family members.  Recommendations: 1) IR for left M2 occlusion 2. 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  Overall he cannot discuss his condition.  His condition is unchanged. Cardene is off since last night.  He remans on low dose Fentanyl for sedation.   OBJECTIVE Temp:  [97.5 F (36.4 C)-102.7 F  (39.3 C)] 102.7 F (39.3 C) (07/15 0800) Pulse Rate:  [0-124] 60 (07/15 0828) Cardiac Rhythm:  [-] Ventricular paced (07/14 2000) Resp:  [0-76] 27 (07/15 0828) BP: (101-196)/(32-131) 117/38 mmHg (07/15 0828) SpO2:  [0 %-100 %] 100 % (07/15 0828) Arterial Line BP: (72-164)/(32-68) 114/38 mmHg (07/15 0800) FiO2 (%):  [70 %-100 %] 70 % (07/15 0828)  CBC:  Recent Labs Lab 03/27/2016 0840  03/19/2016 1836 03/12/16 0515  WBC 8.8  --   --  6.5  NEUTROABS  --   --   --  5.5  HGB 13.0  < > 11.6* 10.3*  HCT 39.2*  < > 34.0* 31.8*  MCV 87.6  --   --  85.7  PLT 209  --   --  182  < > = values in this interval not displayed.  Basic Metabolic Panel:  Recent Labs Lab 03/18/2016 0840  03/18/2016 1807 03/17/2016 1836 03/12/16 0515  NA 138  < > 144 142 142  K 3.9  < > 4.1 4.0 3.4*  CL 107  --   --   --  111  CO2 19*  --   --   --  22  GLUCOSE 143*  --  165*  --  98  BUN 31*  --   --   --  29*  CREATININE 1.33*  --   --   --  1.44*  CALCIUM 9.3  --   --   --  7.9*  < > = values in this interval not displayed.  Lipid Panel:    Component Value Date/Time   CHOL 88 03/12/2016 0515   TRIG 47 03/12/2016 0515   HDL 37* 03/12/2016 0515   CHOLHDL 2.4 03/12/2016 0515   VLDL 9 03/12/2016 0515   LDLCALC 42 03/12/2016 0515   HgbA1c: No results found for: HGBA1C Urine Drug Screen: No results found for: LABOPIA, COCAINSCRNUR, LABBENZ, AMPHETMU, THCU, LABBARB    IMAGING  Ct Angio Head and Neck W Or Wo Contrast 03/15/2016   1. Left M1 occlusion with poor collateralization of the left MCA distribution.   No additional large vessel occlusion is identified.  2. Intracranial atherosclerosis with areas of mild-to-moderate stenosis of the anterior and posterior circulation. No aneurysm is identified.  3. Proximal occlusion of the right vertebral artery of the neck and irregularity with calcifications of the reconstituted upper right vertebral artery of the neck is probably on a chronic basis and related to  atherosclerosis.  4. Moderate 50% stenosis of the proximal right internal carotid artery at the bifurcation secondary to calcified plaque.  5. Ground-glass opacities and septal thickening in the lung apices probably represents pulmonary edema, superimposed pneumonia is not excluded.    Ct Head Wo Contrast 03/10/2016   1. The left inferior insular cortex is partially obscured. There may be a small acute infarct in this area.  2. No other acute infarct.  The basal ganglia are intact.  3. Small posterior bilateral subdural collections. These likely represents residua of prior large subdural hematomas. ASPECTS score = 9/10 [place] Stroke Program Early CT Score Normal score = 10 Electronically Signed      Ct Head Wo Contrast 03/03/2016   1. No acute hemorrhage or large non-hemorrhagic infarct is present. Smaller nonhemorrhagic infarcts may be obscured by patient motion.    Dg Chest Port 1 View 03/05/2016   Interval probable bilateral alveolar edema. Pneumonia is less likely.   Dg Chest Port 1 View 03/23/2016   Mild cardiomegaly.  No active disease.    PHYSICAL EXAM HEENT- Normocephalic, no lesions, without obvious abnormality. Normal external eye and conjunctiva.ET tube in place Cardiovascular- afib  Lungs- chest clear, no wheezing, rales, normal symmetric air entry, Heart exam - S1, S2 normal, no murmur, no gallop, rate regular Abd:  Soft ND normal bowel sounds Extrem:  No C/C/E  MS: Sedated; follows no commands; periodically opens eyes with stimulation ZO:XWRUECN:PERRl but sluggish; OCRs limited; corneals present; positive gag Motor/Sensory: dense right hemiplegia; purposeful on left Coordination and Gait: deferred   ASSESSMENT/PLAN Mason Stevens is a 82 y.o. male with history of bilateral subdural hematoma evacuation 10/16/2014,  atrial fibrillation (not anticoagulated), diabetes mellitus, recent severe bradycardia, and hypertension, presenting with acute onset of left  hemiplegia following pacemaker placement. He did not receive IV t-PA due to history of bilateral subdural hematomas. The patient was taken to interventional radiology and subsequent complete revascularization of the occluded left M1 segment.  Stroke:  Dominant infarct felt to be embolic secondary to atrial fibrillation.  Resultant  Right hemiplegia and aphasia  MRI  - not performed due to PPM  MRA - not performed due to PPM  Carotid Doppler - refer to cerebral angiogram results.  2D Echo - pending  LDL - 42  HgbA1c pending  VTE prophylaxis - SCDs  Diet NPO time specified  No antithrombotic prior to admission, now on aspirin 81 mg daily  Patient counseled to be compliant with his antithrombotic medications  Ongoing aggressive stroke risk factor management  Therapy recommendations: Pending  Disposition:  Pending  Hypertension  Blood pressure mildly low at times.  Initially BP management per IR orders.  Permissive hypertension (OK if < 220/120) but gradually normalize in 5-7 days  Long-term BP goal normotensive  Hyperlipidemia  Home meds: Mevacor 40 mg daily changed to Pravachol 20 mg daily.  LDL  42, goal < 70  Continue Mevacor at discharge  Diabetes  HgbA1c pending, goal < 7.0  Uncontrolled:  Status pending labs  Other Stroke Risk Factors  Advanced age  Former cigarette smoker  Obesity, There is no weight on file to calculate BMI., recommend weight loss, diet and exercise as appropriate    Other Active Problems  Bilateral craniotomies for SDH evacuation on 10/16/2014 Dr Venetia Maxon.  Mild hypokalemia; replete and follow  Renal insufficiency  Edema on chest x-ray   PLAN   Discussed with cardiology. No contraindication to antiplatelet therapy due to recent ppm placement. Will get 24 hour CT scan  Will start Aspirin 81 mg daily if no reperfusion bleeding  Lungs:  Will follow CXR  H/H decreased will follow  Potassium replete and will  follow   Hospital day # 1   CRITICAL CARE NEUROLOGY ATTENDING NOTE Patient was seen and examined by me personally. I independently viewed imaging studies, participated in medical decision making and plan of care. The laboratory and radiographic studies were personally reviewed by me.  ROS pertinent positives could not be fully documented due to LOC  Assessment and plan completed by me personally and fully documented above.  Condition is unchanged   This patient is critically ill and at significant risk of neurological worsening, death and care requires constant monitoring of vital signs, hemodynamics,respiratory and cardiac monitoring, extensive review of multiple databases, frequent neurological assessment, discussion with family, other specialists and medical decision making of high complexity.  This critical care time does not reflect procedure time, or teaching time or supervisory time of PA/NP/Med Resident etc. but could involve care discussion time.  I spent 30 minutes of Neurocritical Care time in the care of  this patient.  SIGNED BY: Dr. Sula Soda       To contact Stroke Continuity provider, please refer to [email or web address]. After hours, contact General Neurology

## 2016-03-12 NOTE — Procedures (Signed)
Transported patient on vent to and from CT with no complications.

## 2016-03-12 NOTE — Progress Notes (Addendum)
Initial Nutrition Assessment   INTERVENTION:  Change TF to Vital 1.2 and start feed at 55 mL/hr (1320 ml per day) according to the [facility] protocol. Tube feeding regimen provides 1584 kcal,  (80% of energy and 100% protein needs), 99 grams of protein, and 1070 ml of H2O.     NUTRITION DIAGNOSIS:   Inadequate oral intake related to inability to eat as evidenced by NPO status.  GOAL:   Provide needs based on ASPEN/SCCM guidelines  MONITOR:   Vent status, Labs, Weight trends, I & O's, Skin  REASON FOR ASSESSMENT:   Consult Enteral/tube feeding initiation and management  ASSESSMENT: Pt is an 82 year old male with a past medical history of BPH, peripheral neuropathy, atrial fibrillation (diagnosed 09/2014, not on anticoagulation secondary to falls) prior SDH status post evacuation, HTN, DM who presented to [facility] ER on 7/14 with complaints of "feeling funny". Found to be profoundly bradycardic with HR 20's. [facility]. During pacemaker placement he was noted to have a change in work of breathing, AMS and decreased movement on the right. Pacemaker was placed. Emergent CT of the head showed no acute hemorrhage or nonhemorrhagic infarct. Per stroke RN, CT angiogram of the head and neck showed concerns for distal M1 infarct. He was emergently returned to ICU for intubation in the setting of respiratory distress for airway protection. Overnight 7/14 he underwent IR revascularization of occluded L MCA.  He is intubated and tube feeds are being started.    Diet hx: Patient's family is here and says he has an excellent appetite and eats 5-6 small meals daily. He also likes ensure and drinks occasionally. No overt signs of malnutrition.   Recent Labs Lab 03/18/2016 0840  03/08/2016 1807 03/15/2016 1836 03/12/16 0515  NA 138  < > 144 142 142  K 3.9  < > 4.1 4.0 3.4*  CL 107  --   --   --  111  CO2 19*  --   --   --  22  BUN 31*  --   --   --  29*   CREATININE 1.33*  --   --   --  1.44*  CALCIUM 9.3  --   --   --  7.9*  GLUCOSE 143*  --  165*  --  98  < > = values in this interval not displayed.   Diet Order:  Diet NPO time specified  Skin:   incisions to left shoulder and right groin  Last BM:  prior to admission  Height:   Ht Readings from Last 1 Encounters:  03/12/16 5\' 11"  (1.803 m)    Weight:   Wt Readings from Last 1 Encounters:  03/16/2016 160 lb (72.576 kg)    Ideal Body Weight:  78 kg  BMI:  There is no weight on file to calculate BMI.  Estimated Nutritional Needs:   Kcal:  1988  Protein:  94-101 gr  Fluid:  2.0 liters daily  EDUCATION NEEDS: none identified at this time  Royann ShiversLynn Elorah Dewing MS,RD,CSG,LDN Office: #[phone] Pager: [phone]

## 2016-03-12 NOTE — Progress Notes (Signed)
  Echocardiogram 2D Echocardiogram has been performed.  Arvil ChacoFoster, Ishani Goldwasser 03/12/2016, 3:24 PM

## 2016-03-13 ENCOUNTER — Inpatient Hospital Stay (HOSPITAL_COMMUNITY): Payer: Medicare HMO

## 2016-03-13 DIAGNOSIS — G936 Cerebral edema: Secondary | ICD-10-CM

## 2016-03-13 LAB — MAGNESIUM: Magnesium: 2.3 mg/dL (ref 1.7–2.4)

## 2016-03-13 LAB — CBC
HCT: 32.5 % — ABNORMAL LOW (ref 39.0–52.0)
Hemoglobin: 10.4 g/dL — ABNORMAL LOW (ref 13.0–17.0)
MCH: 27.9 pg (ref 26.0–34.0)
MCHC: 32 g/dL (ref 30.0–36.0)
MCV: 87.1 fL (ref 78.0–100.0)
PLATELETS: 117 10*3/uL — AB (ref 150–400)
RBC: 3.73 MIL/uL — ABNORMAL LOW (ref 4.22–5.81)
RDW: 16.7 % — AB (ref 11.5–15.5)
WBC: 12 10*3/uL — AB (ref 4.0–10.5)

## 2016-03-13 LAB — BASIC METABOLIC PANEL
ANION GAP: 10 (ref 5–15)
BUN: 33 mg/dL — ABNORMAL HIGH (ref 6–20)
CO2: 21 mmol/L — ABNORMAL LOW (ref 22–32)
CREATININE: 1.4 mg/dL — AB (ref 0.61–1.24)
Calcium: 8.1 mg/dL — ABNORMAL LOW (ref 8.9–10.3)
Chloride: 109 mmol/L (ref 101–111)
GFR calc Af Amer: 51 mL/min — ABNORMAL LOW (ref >60)
GFR, EST NON AFRICAN AMERICAN: 44 mL/min — AB (ref >60)
GLUCOSE: 202 mg/dL — AB (ref 65–99)
Potassium: 4.2 mmol/L (ref 3.5–5.1)
Sodium: 140 mmol/L (ref 135–145)

## 2016-03-13 LAB — PHOSPHORUS: PHOSPHORUS: 2.3 mg/dL — AB (ref 2.5–4.6)

## 2016-03-13 LAB — GLUCOSE, CAPILLARY
GLUCOSE-CAPILLARY: 242 mg/dL — AB (ref 65–99)
Glucose-Capillary: 205 mg/dL — ABNORMAL HIGH (ref 65–99)
Glucose-Capillary: 239 mg/dL — ABNORMAL HIGH (ref 65–99)

## 2016-03-13 LAB — PROCALCITONIN: PROCALCITONIN: 14.99 ng/mL

## 2016-03-13 MED ORDER — ASPIRIN 325 MG PO TABS
325.0000 mg | ORAL_TABLET | Freq: Every day | ORAL | Status: DC
  Filled 2016-03-13: qty 1

## 2016-03-13 MED ORDER — INSULIN GLARGINE 100 UNIT/ML ~~LOC~~ SOLN
10.0000 [IU] | Freq: Every day | SUBCUTANEOUS | Status: DC
  Filled 2016-03-13: qty 0.1

## 2016-03-13 MED ORDER — MORPHINE SULFATE (PF) 2 MG/ML IV SOLN: 1.0000 mg | INTRAVENOUS | Status: DC | PRN

## 2016-03-13 MED ORDER — HYDRALAZINE HCL 25 MG PO TABS: 25.0000 mg | ORAL_TABLET | Freq: Three times a day (TID) | ORAL | Status: DC

## 2016-03-13 MED ORDER — HYDRALAZINE HCL 20 MG/ML IJ SOLN
5.0000 mg | INTRAMUSCULAR | Status: DC | PRN
  Administered 2016-03-13: 5 mg via INTRAVENOUS
  Filled 2016-03-13: qty 1

## 2016-03-13 MED ORDER — LORAZEPAM 2 MG/ML IJ SOLN
2.0000 mg | INTRAMUSCULAR | Status: DC | PRN
  Administered 2016-03-13 – 2016-03-14 (×4): 2 mg via INTRAVENOUS
  Filled 2016-03-13 (×4): qty 1

## 2016-03-13 MED ORDER — LORAZEPAM 2 MG/ML IJ SOLN: 2.0000 mg | INTRAMUSCULAR | Status: DC

## 2016-03-13 NOTE — Progress Notes (Signed)
   03/13/16 1342  Clinical Encounter Type  Visited With Patient and family together  Visit Type Initial;Spiritual support;Patient actively dying  Referral From Nurse  Spiritual Encounters  Spiritual Needs Emotional;Prayer  Chaplain visited with family and patient.  Patient actively dying.  Chaplain prayed, read scripture and offered emotional support to family.

## 2016-03-13 NOTE — Progress Notes (Signed)
SUBJECTIVE: The patient is critically ill s/p stroke.  CT yesterday is quite worrisome.  Pt and family are currently meeting with neurology.  .  stroke: mapping our early stages of recovery book   Does not apply Once  . amLODipine  10 mg Oral Daily  . ampicillin-sulbactam (UNASYN) IV  3 g Intravenous Q8H  . antiseptic oral rinse  7 mL Mouth Rinse 10 times per day  . aspirin  325 mg Oral Daily  . chlorhexidine gluconate (SAGE KIT)  15 mL Mouth Rinse BID  . famotidine  20 mg Oral BID  . hydrALAZINE  25 mg Oral Q8H  . insulin aspart  0-15 Units Subcutaneous Q4H  . insulin glargine  10 Units Subcutaneous Daily  . potassium chloride  40 mEq Per Tube Once  . pravastatin  20 mg Oral q1800   . sodium chloride 1,000 mL (03/12/16 2041)  . feeding supplement (VITAL AF 1.2 CAL) 1,320 mL (03/12/16 1807)  . fentaNYL infusion INTRAVENOUS 50 mcg/hr (03/13/16 0954)  . propofol (DIPRIVAN) infusion Stopped (03/12/16 0135)    OBJECTIVE: Physical Exam: Filed Vitals:   03/13/16 0750 03/13/16 0800 03/13/16 0810 03/13/16 0952  BP: 158/54 176/55  171/75  Pulse: 60 59    Temp:  102.9 F (39.4 C)    TempSrc:  Axillary    Resp: 30 26    Height:      Weight:      SpO2: 94% 99% 97%     Intake/Output Summary (Last 24 hours) at 03/13/16 1042 Last data filed at 03/13/16 0954  Gross per 24 hour  Intake 2630.42 ml  Output   2685 ml  Net -54.58 ml    Telemetry reveals afib, V paced  GEN- The patient is ill appearing, sedated on vent.    LABS: Basic Metabolic Panel:  Recent Labs  03/12/16 0515  03/12/16 1700 03/13/16 0630  NA 142  --   --  140  K 3.4*  --   --  4.2  CL 111  --   --  109  CO2 22  --   --  21*  GLUCOSE 98  --   --  202*  BUN 29*  --   --  33*  CREATININE 1.44*  --   --  1.40*  CALCIUM 7.9*  --   --  8.1*  MG  --   < > 2.5* 2.3  PHOS  --   < > 3.2 2.3*  < > = values in this interval not displayed. Liver Function Tests:  Recent Labs  03/23/2016 0840  AST 30  ALT  16*  ALKPHOS 60  BILITOT 0.8  PROT 6.8  ALBUMIN 3.8   No results for input(s): LIPASE, AMYLASE in the last 72 hours. CBC:  Recent Labs  03/12/16 0515 03/13/16 0630  WBC 6.5 12.0*  NEUTROABS 5.5  --   HGB 10.3* 10.4*  HCT 31.8* 32.5*  MCV 85.7 87.1  PLT 182 117*   Cardiac Enzymes:  Recent Labs  02/27/2016 0840  TROPONINI 0.09*   Fasting Lipid Panel:  Recent Labs  03/12/16 0515  CHOL 88  HDL 37*  LDLCALC 42  TRIG 47  CHOLHDL 2.4   RADIOLOGY: Ct Angio Head W Or Wo Contrast  02/28/2016  CLINICAL DATA:  Stroke code. EXAM: CT ANGIOGRAPHY HEAD AND NECK TECHNIQUE: Multidetector CT imaging of the head and neck was performed using the standard protocol during bolus administration of intravenous contrast. Multiplanar CT image reconstructions and MIPs  were obtained to evaluate the vascular anatomy. Carotid stenosis measurements (when applicable) are obtained utilizing NASCET criteria, using the distal internal carotid diameter as the denominator. CONTRAST:  50 cc Isovue 370. COMPARISON:  None. FINDINGS: CTA NECK Aortic arch: Mild calcific atherosclerosis of the aortic arch. Right carotid system: Moderate plaque at the bifurcation with moderate 50% stenosis of the proximal right internal carotid artery. Left carotid system: Mild plaque at the bifurcation without hemodynamically significant stenosis. Vertebral arteries:No appreciable right vertebral artery from the origin to the C3 level. The vertebral artery above the C3 level is irregular and attenuated with a short segment of severe stenosis and calcification at the craniocervical junction. The left vertebral artery is widely patent. Skeleton: Moderate multilevel cervical spondylosis greatest at the C5-C7 levels with prominent right-sided facet arthrosis component. No acute osseous abnormality is identified. Other neck: Ground-glass opacities and septal thickening within the lung apices probably reflects pulmonary edema. Superimposed  pneumonia is not excluded. Debris within the upper esophagus. No discrete cervical mass or lymphadenopathy is identified. The thyroid gland is unremarkable. The aerodigestive tract is patent without a discrete exophytic mass identified. Left-sided pacemaker with leads extending below the field of view in the superior vena cava. CTA HEAD Anterior circulation: Attenuation of proximal left M1 with distal occlusion and no opacification superior or inferior M2 divisions. Poor left MCA collateralization. Bilateral ACA and right MCA are patent. There are short segments of mild stenosis in the right proximal MCA. Bilateral internal carotid arteries are patent. Mild calcific atherosclerosis of cavernous segments without significant stenosis. Small anterior communicating artery. Small left posterior communicating artery possible diminutive right posterior communicating artery. Posterior circulation: Left dominant vertebrobasilar system. Patent vertebral and basilar arteries. Bilateral posterior cerebral arteries are patent. Short segment of moderate stenosis within the proximal basilar artery. Mild irregularity of bilateral proximal posterior cerebral arteries with areas of mild stenosis likely related to atherosclerosis. Venous sinuses: No venous sinus thrombosis is identified. Anatomic variants: None. Calvarium and skull base: Postsurgical changes related to bilateral frontal craniotomies again seen. The calvarium is otherwise unremarkable. IMPRESSION: 1. Left M1 occlusion with poor collateralization of the left MCA distribution. No additional large vessel occlusion is identified. 2. Intracranial atherosclerosis with areas of mild-to-moderate stenosis of the anterior and posterior circulation. No aneurysm is identified. 3. Proximal occlusion of the right vertebral artery of the neck and irregularity with calcifications of the reconstituted upper right vertebral artery of the neck is probably on a chronic basis and related  to atherosclerosis. 4. Moderate 50% stenosis of the proximal right internal carotid artery at the bifurcation secondary to calcified plaque. 5. Ground-glass opacities and septal thickening in the lung apices probably represents pulmonary edema, superimposed pneumonia is not excluded. Critical Value/emergent results were called by telephone at the time of interpretation on 03/01/2016 at 4:47 pm to Dr. Etta Quill , who verbally acknowledged these results. Electronically Signed   By: Kristine Garbe M.D.   On: 03/04/2016 17:03   Ct Head Wo Contrast  03/12/2016  CLINICAL DATA:  Initial evaluation for acute stroke, status post endovascular revascularization. EXAM: CT HEAD WITHOUT CONTRAST TECHNIQUE: Contiguous axial images were obtained from the base of the skull through the vertex without intravenous contrast. COMPARISON:  Prior CT from 02/29/2016. FINDINGS: Extensive evolving cytotoxic edema now seen throughout much of the left MCA territory, consistent with evolving ischemic infarct. There is involvement of the left frontal, parietal, and temporal lobes, with involvement of the insular cortex. Scattered basal ganglia involvement of the left  lentiform nucleus. Associated edema with sulcal effacement. Mild of mass effect on the left lateral ventricle which is partially effaced. Associated left-to-right shift measures up to 5 mm. No hydrocephalus or ventricular trapping. Scattered hyperdensity with within the left sylvian fissure likely reflects small amount of contrast staining from recent arteriogram. Possible small amount of hemorrhage related to blood brain barrier breakdown may also be present. No other areas of hemorrhage. Cerebral atrophy with chronic small vessel ischemic disease noted. Remote right cerebellar infarcts present. Chronic hypodense small subdural hygroma again seen, measuring 7 mm at the right parietal convexity. No mass lesion. Scalp soft tissues within normal limits. No acute  abnormality about the globes and orbits. Paranasal sinuses are clear. Patient is intubated. No mastoid effusion. Sequela prior bifrontal craniotomy again noted. Calvarium otherwise intact. IMPRESSION: 1. Large evolving acute left MCA territory infarct with associated mass effect and new 4 mm of left-to-right shift. No hydrocephalus or ventricular trapping. 2. Small volume hyperdensity within the left sylvian fissure, favored to reflect residual contrast staining from recent revascularization procedure. Small amount of hemorrhage related to blood brain barrier breakdown due to ischemia or possibly vascular trauma from recent procedure may also be present. Electronically Signed   By: Jeannine Boga M.D.   On: 03/12/2016 22:02   Ct Head Wo Contrast  03/23/2016  CLINICAL DATA:  Status post revascularization of the left middle cerebral artery. Previous bilateral subdural hematomas. EXAM: CT HEAD WITHOUT CONTRAST TECHNIQUE: Contiguous axial images were obtained from the base of the skull through the vertex without intravenous contrast. COMPARISON:  CT head and CTA head from the same day. CT head without contrast 01/07/2015 FINDINGS: The left insular cortex is partially obscured. The basal ganglia are intact. No other focal cortical infarct is present. The thalami are intact. Remote infarcts of the right cerebellum are stable. No acute hemorrhage is present. Small bilateral subdural collections are noted over the posterior convexities. These are likely chronic. Patient is intubated. The OG tube is in place. The paranasal sinuses mastoid air cells are clear. The calvarium is intact. Bilateral craniotomies are associated with previous subdural evacuation sub. The globes and orbits are within normal limits. IMPRESSION: 1. The left inferior insular cortex is partially obscured. There may be a small acute infarct in this area. 2. No other acute infarct.  The basal ganglia are intact. 3. Small posterior bilateral  subdural collections. These likely represents residua of prior large subdural hematomas. ASPECTS score = 9/10 [place] Stroke Program Early CT Score Normal score = 10 Electronically Signed   By: San Morelle M.D.   On: 02/29/2016 20:03   Ct Head Wo Contrast  03/21/2016  CLINICAL DATA:  Code stroke. EXAM: CT HEAD WITHOUT CONTRAST TECHNIQUE: Contiguous axial images were obtained from the base of the skull through the vertex without intravenous contrast. COMPARISON:  01/07/2015 FINDINGS: Brain: Markedly diminished exam detail secondary to motion artifact. Patient moving during examination. Unable to maintain stillness. No evidence of acute infarction, hemorrhage, extra-axial collection, ventriculomegaly, or mass effect. Vascular: No hyperdense vessel or unexpected calcification. Skull: Bilateral craniotomies have been performed. Sinuses/Orbits: No acute findings. Other: None. IMPRESSION: 1. No acute hemorrhage or large non-hemorrhagic infarct is present. Smaller nonhemorrhagic infarcts may be obscured by patient motion. Electronically Signed   By: Kerby Moors M.D.   On: 03/28/2016 16:28   Ct Angio Neck W Or Wo Contrast  03/19/2016  CLINICAL DATA:  Stroke code. EXAM: CT ANGIOGRAPHY HEAD AND NECK TECHNIQUE: Multidetector CT imaging of the head  and neck was performed using the standard protocol during bolus administration of intravenous contrast. Multiplanar CT image reconstructions and MIPs were obtained to evaluate the vascular anatomy. Carotid stenosis measurements (when applicable) are obtained utilizing NASCET criteria, using the distal internal carotid diameter as the denominator. CONTRAST:  50 cc Isovue 370. COMPARISON:  None. FINDINGS: CTA NECK Aortic arch: Mild calcific atherosclerosis of the aortic arch. Right carotid system: Moderate plaque at the bifurcation with moderate 50% stenosis of the proximal right internal carotid artery. Left carotid system: Mild plaque at the bifurcation without  hemodynamically significant stenosis. Vertebral arteries:No appreciable right vertebral artery from the origin to the C3 level. The vertebral artery above the C3 level is irregular and attenuated with a short segment of severe stenosis and calcification at the craniocervical junction. The left vertebral artery is widely patent. Skeleton: Moderate multilevel cervical spondylosis greatest at the C5-C7 levels with prominent right-sided facet arthrosis component. No acute osseous abnormality is identified. Other neck: Ground-glass opacities and septal thickening within the lung apices probably reflects pulmonary edema. Superimposed pneumonia is not excluded. Debris within the upper esophagus. No discrete cervical mass or lymphadenopathy is identified. The thyroid gland is unremarkable. The aerodigestive tract is patent without a discrete exophytic mass identified. Left-sided pacemaker with leads extending below the field of view in the superior vena cava. CTA HEAD Anterior circulation: Attenuation of proximal left M1 with distal occlusion and no opacification superior or inferior M2 divisions. Poor left MCA collateralization. Bilateral ACA and right MCA are patent. There are short segments of mild stenosis in the right proximal MCA. Bilateral internal carotid arteries are patent. Mild calcific atherosclerosis of cavernous segments without significant stenosis. Small anterior communicating artery. Small left posterior communicating artery possible diminutive right posterior communicating artery. Posterior circulation: Left dominant vertebrobasilar system. Patent vertebral and basilar arteries. Bilateral posterior cerebral arteries are patent. Short segment of moderate stenosis within the proximal basilar artery. Mild irregularity of bilateral proximal posterior cerebral arteries with areas of mild stenosis likely related to atherosclerosis. Venous sinuses: No venous sinus thrombosis is identified. Anatomic variants:  None. Calvarium and skull base: Postsurgical changes related to bilateral frontal craniotomies again seen. The calvarium is otherwise unremarkable. IMPRESSION: 1. Left M1 occlusion with poor collateralization of the left MCA distribution. No additional large vessel occlusion is identified. 2. Intracranial atherosclerosis with areas of mild-to-moderate stenosis of the anterior and posterior circulation. No aneurysm is identified. 3. Proximal occlusion of the right vertebral artery of the neck and irregularity with calcifications of the reconstituted upper right vertebral artery of the neck is probably on a chronic basis and related to atherosclerosis. 4. Moderate 50% stenosis of the proximal right internal carotid artery at the bifurcation secondary to calcified plaque. 5. Ground-glass opacities and septal thickening in the lung apices probably represents pulmonary edema, superimposed pneumonia is not excluded. Critical Value/emergent results were called by telephone at the time of interpretation on 03/04/2016 at 4:47 pm to Dr. Etta Quill , who verbally acknowledged these results. Electronically Signed   By: Kristine Garbe M.D.   On: 03/16/2016 17:03   Dg Chest Port 1 View  03/13/2016  CLINICAL DATA:  Ventilator dependence. EXAM: PORTABLE CHEST 1 VIEW COMPARISON:  Radiograph of March 12, 2016. FINDINGS: Endotracheal and nasogastric tubes are stable in position. Left-sided pacemaker is unchanged. No pneumothorax is noted. Bony thorax is unremarkable. Stable bilateral lung opacities are noted concerning for edema, atelectasis or inflammation. Cardiomediastinal silhouette is unremarkable. Bony thorax is unremarkable. IMPRESSION: Stable support apparatus. Stable  bilateral lung opacities as described above. Electronically Signed   By: Marijo Conception, M.D.   On: 03/13/2016 07:44   Portable Chest Xray  03/12/2016  CLINICAL DATA:  82 year old male with a history of respiratory distress EXAM: PORTABLE CHEST 1  VIEW COMPARISON:  03/04/2016, 10/17/2014 FINDINGS: Cardiomediastinal silhouette unchanged in size and contour. Persisting bilateral mixed interstitial and airspace opacities, on the right predominantly upper lung, on the left, predominantly lower lung. Unchanged endotracheal tube, terminating above the carina 4.9 cm. Unchanged cardiac pacing device on left chest wall. Unchanged gastric tube terminating out of the field of view. No pneumothorax. IMPRESSION: Similar appearance of bilateral interstitial and airspace disease. Unchanged endotracheal tube and gastric tube. Signed, Dulcy Fanny. Earleen Newport, DO [facility] Electronically Signed   By: Corrie Mckusick D.O.   On: 03/12/2016 09:47   Dg Chest Port 1 View  03/26/2016  CLINICAL DATA:  Acute respiratory failure. Intubated. Complete heart block. EXAM: PORTABLE CHEST 1 VIEW COMPARISON:  Earlier today. FINDINGS: Interval endotracheal tube in satisfactory position. Normal sized heart. Interval patchy airspace opacity throughout the majority of the right lung and in the left perihilar region. Possible minimal right pleural fluid. Thoracic spine degenerative changes. Interval left subclavian bipolar pacemaker with the leads in satisfactory position. No pneumothorax. Interval nasogastric tube extending into the stomach. IMPRESSION: Interval probable bilateral alveolar edema. Pneumonia is less likely. Electronically Signed   By: Claudie Revering M.D.   On: 02/29/2016 17:10   Dg Chest Port 1 View  03/22/2016  CLINICAL DATA:  Bradycardia. EXAM: PORTABLE CHEST 1 VIEW COMPARISON:  10/17/2014 FINDINGS: Mild cardiomegaly. Lungs are clear. No effusions or acute bony abnormality. IMPRESSION: Mild cardiomegaly.  No active disease. Electronically Signed   By: Rolm Baptise M.D.   On: 03/08/2016 08:41   Dg Abd Portable 1v  03/12/2016  CLINICAL DATA:  OG tube placement. EXAM: PORTABLE ABDOMEN - 1 VIEW COMPARISON:  None. FINDINGS: An  OG tube is identified with tip overlying the mid stomach. The bowel gas pattern is unremarkable. IMPRESSION: OG tube with tip overlying the mid stomach. Electronically Signed   By: Margarette Canada M.D.   On: 03/12/2016 15:14    ASSESSMENT AND PLAN:  Active Problems:   Bradycardia   CHB (complete heart block) (HCC)   Symptomatic bradycardia   Acute respiratory failure (HCC)   Stroke [facility])   Cerebrovascular accident (CVA) due to thrombosis of left middle cerebral artery ([place])   Pacemaker   Cerebral edema ([place])  1. Complete heart block S/p PPM Normal device function  2. Permanent atrial fibrillation Given findings on CT, not a candidate for additional anticoagulation currently  3. Acute stroke Very poor prognosis.  Appreciated neurology and PCCM assistance. Palliative measures may be best.    Thompson Grayer, MD 03/13/2016 10:42 AM

## 2016-03-13 NOTE — Procedures (Signed)
Extubation Procedure Note  Patient Details:   Name: Mason [name] DOB: 10/31/1926 MRN: [id number]   Airway Documentation:     Evaluation  O2 sats: stable throughout and currently acceptable Complications: No apparent complications Patient did tolerate procedure well. Bilateral Breath Sounds: Clear, Diminished   No  Pt was terminally extubated with the family at bedside.   Melanee Spryelson, Aryam Zhan Lawson 03/13/2016, 5:44 PM

## 2016-03-13 NOTE — Progress Notes (Signed)
PULMONARY / CRITICAL CARE MEDICINE   Name: Mason Stevens MRN: [id number] DOB: September 03, 1926    ADMISSION DATE:  04-07-16 CONSULTATION DATE:  04/07/2016  REFERRING MD:  Dr. Ladona Ridgel   CHIEF COMPLAINT:  Respiratory Distress   HISTORY OF PRESENT ILLNESS:  82 year old male with a past medical history of BPH, peripheral neuropathy, atrial fibrillation (diagnosed 09/2014, not on anticoagulation secondary to falls) prior SDH status post evacuation, HTN, DM who presented to [facility] ER on 7/14 with complaints of "feeling funny". Found to be profoundly bradycardic with HR 20's. He was tx to [facility] for pacemaker placement. During pacemaker placement he was noted to have a change in work of breathing, AMS and decreased movement on the right. Pacemaker was placed. Emergent CT of the head showed no acute hemorrhage or nonhemorrhagic infarct. Per stroke RN, CT angiogram of the head and neck showed concerns for distal M1 infarct.  He was emergently returned to ICU for intubation in the setting of respiratory distress for airway protection.  Overnight 7/14 he underwent IR revascularization of occluded L MCA.    SUBJECTIVE/Overnight:  Remains febrile.  Worsening head CT with increased L to R shift. Small amt hemorrhage forming.    VITAL SIGNS: BP 176/55 mmHg  Pulse 59  Temp(Src) 99.1 F (37.3 C) (Axillary)  Resp 26  Ht 5\' 11"  (1.803 m)  Wt 76.4 kg (168 lb 6.9 oz)  BMI 23.50 kg/m2  SpO2 97%  HEMODYNAMICS:    VENTILATOR SETTINGS: Vent Mode:  [-] PRVC FiO2 (%):  [40 %-70 %] 50 % Set Rate:  [14 bmp] 14 bmp Vt Set:  [600 mL] 600 mL PEEP:  [8 cmH20] 8 cmH20 Plateau Pressure:  [21 cmH20-23 cmH20] 23 cmH20  INTAKE / OUTPUT: I/O last 3 completed shifts: In: 5446.6 [I.V.:3876.2; NG/GT:765.4; IV Piggyback:805] Out: 3830 [Urine:3830]  PHYSICAL EXAMINATION: General:  Elderly male, NAD on vent  Neuro:  Moves spontaneously L>R, withdraws L sided to pain, does not move RUE, does not follow  commands, does not open eyes, pupils 3mm sluggish  HEENT:  MM pink/moist, no JVD, ETT Cardiovascular:  s1s2 distant Lungs:  resps even non labored on vent, breathes over set rate, coarse Abdomen:  Soft, BSx4 active Musculoskeletal:  No acute deformities, left upper extremity in sling post pacemaker placement, R groin dressing c/d, groin soft  Skin:  Warm and dry, no edema   LABS:  BMET  Recent Labs Lab Apr 07, 2016 0840  04-07-2016 1807 04/07/2016 1836 03/12/16 0515 03/13/16 0630  NA 138  < > 144 142 142 140  K 3.9  < > 4.1 4.0 3.4* 4.2  CL 107  --   --   --  111 109  CO2 19*  --   --   --  22 21*  BUN 31*  --   --   --  29* 33*  CREATININE 1.33*  --   --   --  1.44* 1.40*  GLUCOSE 143*  --  165*  --  98 202*  < > = values in this interval not displayed.  Electrolytes  Recent Labs Lab 04/07/16 0840 03/12/16 0515 03/12/16 0822 03/12/16 1700 03/13/16 0630  CALCIUM 9.3 7.9*  --   --  8.1*  MG  --   --  1.4* 2.5* 2.3  PHOS  --   --  3.2 3.2 2.3*    CBC  Recent Labs Lab April 07, 2016 0840  04-07-2016 1836 03/12/16 0515 03/13/16 0630  WBC 8.8  --   --  6.5 12.0*  HGB 13.0  < > 11.6* 10.3* 10.4*  HCT 39.2*  < > 34.0* 31.8* 32.5*  PLT 209  --   --  182 PENDING  < > = values in this interval not displayed.  Coag's  Recent Labs Lab 2016-03-29 0840  APTT 33  INR 0.99    Sepsis Markers  Recent Labs Lab 2016-03-29 1705 03/12/16 0515 03/13/16 0630  PROCALCITON 0.39 10.41 14.99    ABG  Recent Labs Lab 2016-03-29 2202 03/12/16 0629 03/12/16 1020  PHART 7.309* 7.533* 7.450  PCO2ART 34.6* 21.7* 31.7*  PO2ART 44.0* 198* 115*    Liver Enzymes  Recent Labs Lab 2016-03-29 0840  AST 30  ALT 16*  ALKPHOS 60  BILITOT 0.8  ALBUMIN 3.8    Cardiac Enzymes  Recent Labs Lab 2016-03-29 0840  TROPONINI 0.09*    Glucose  Recent Labs Lab 03/12/16 0755 03/12/16 1219 03/12/16 1604 03/12/16 2045 03/13/16 0002 03/13/16 0331  GLUCAP 77 84 95 107* 205* 239*     Imaging Ct Head Wo Contrast  03/12/2016  CLINICAL DATA:  Initial evaluation for acute stroke, status post endovascular revascularization. EXAM: CT HEAD WITHOUT CONTRAST TECHNIQUE: Contiguous axial images were obtained from the base of the skull through the vertex without intravenous contrast. COMPARISON:  Prior CT from Oct 23, 2015. FINDINGS: Extensive evolving cytotoxic edema now seen throughout much of the left MCA territory, consistent with evolving ischemic infarct. There is involvement of the left frontal, parietal, and temporal lobes, with involvement of the insular cortex. Scattered basal ganglia involvement of the left lentiform nucleus. Associated edema with sulcal effacement. Mild of mass effect on the left lateral ventricle which is partially effaced. Associated left-to-right shift measures up to 5 mm. No hydrocephalus or ventricular trapping. Scattered hyperdensity with within the left sylvian fissure likely reflects small amount of contrast staining from recent arteriogram. Possible small amount of hemorrhage related to blood brain barrier breakdown may also be present. No other areas of hemorrhage. Cerebral atrophy with chronic small vessel ischemic disease noted. Remote right cerebellar infarcts present. Chronic hypodense small subdural hygroma again seen, measuring 7 mm at the right parietal convexity. No mass lesion. Scalp soft tissues within normal limits. No acute abnormality about the globes and orbits. Paranasal sinuses are clear. Patient is intubated. No mastoid effusion. Sequela prior bifrontal craniotomy again noted. Calvarium otherwise intact. IMPRESSION: 1. Large evolving acute left MCA territory infarct with associated mass effect and new 4 mm of left-to-right shift. No hydrocephalus or ventricular trapping. 2. Small volume hyperdensity within the left sylvian fissure, favored to reflect residual contrast staining from recent revascularization procedure. Small amount of hemorrhage  related to blood brain barrier breakdown due to ischemia or possibly vascular trauma from recent procedure may also be present. Electronically Signed   By: Rise MuBenjamin  McClintock M.D.   On: 03/12/2016 22:02   Dg Chest Port 1 View  03/13/2016  CLINICAL DATA:  Ventilator dependence. EXAM: PORTABLE CHEST 1 VIEW COMPARISON:  Radiograph of March 12, 2016. FINDINGS: Endotracheal and nasogastric tubes are stable in position. Left-sided pacemaker is unchanged. No pneumothorax is noted. Bony thorax is unremarkable. Stable bilateral lung opacities are noted concerning for edema, atelectasis or inflammation. Cardiomediastinal silhouette is unremarkable. Bony thorax is unremarkable. IMPRESSION: Stable support apparatus. Stable bilateral lung opacities as described above. Electronically Signed   By: Lupita RaiderJames  Green Jr, M.D.   On: 03/13/2016 07:44   Dg Abd Portable 1v  03/12/2016  CLINICAL DATA:  OG tube placement. EXAM: PORTABLE ABDOMEN -  1 VIEW COMPARISON:  None. FINDINGS: An OG tube is identified with tip overlying the mid stomach. The bowel gas pattern is unremarkable. IMPRESSION: OG tube with tip overlying the mid stomach. Electronically Signed   By: Harmon Pier M.D.   On: 03/12/2016 15:14     STUDIES:  CT head 7/14 >> no acute hemorrhage CTA head/neck 7/14 >> distal M1 infarct CT head 7/15>>> 1. Large evolving acute left MCA territory infarct with associated mass effect and new 4 mm of left-to-right shift. No hydrocephalus or ventricular trapping. 2. Small volume hyperdensity within the left sylvian fissure, favored to reflect residual contrast staining from recent revascularization procedure. Small amount of hemorrhage related to blood brain barrier breakdown due to ischemia or possibly vascular trauma from recent procedure may also be present.  CULTURES:   ANTIBIOTICS: Unasyn 7/14>>>  SIGNIFICANT EVENTS: 7/14  Admit to [facility] with symptomatically bradycardia.  Developed right-sided weakness and rest  for distress during cath, CTA of the head concerning for distal M1 infarct.  To neuro IR 7/14 IR revascularization of occluded L MCA  LINES/TUBES: ETT 7/14 >>  DISCUSSION: 82 year old male with a past medical history of atrial fibrillation not on anticoagulation, diabetes and hypertension who was admitted 7/14 with symptomatically bradycardia S/P PPM who developed right-sided weakness and respiratory distress during procedure. Emergent CT of the head concerning for distal M1 infarct.  Intubated emergently post CT.  ASSESSMENT / PLAN:  PULMONARY A: Acute hypoxic respiratory failure  Concern for pulmonary edema / CHF Respiratory alkalosis  P:   Vent support - 8cc/kg  Cuff leak resolved with advancement of ETT per RT F/u CXR  F/u ABG Wean PEEP and FiO2 for sats greater than 92% Intermittent CXR Albuterol PRN  F/u ABG   NEUROLOGIC A:   Acute encephalopathy / right-sided weakness  Distal L MCA infarct - s/p IR revascularization -- evolving infarct, worsening head CT 7/15 with new midline shift and small amt hemorrhage.  P:   RASS goal: 0 to -1 Fentanyl drip  PRN Versed for sedation Neuro following  Neuro to discuss poor prognosis    CARDIOVASCULAR A:  Symptomatically bradycardia status post PPM 7/14 Concern for CHF - 2/16 LVEF 55% Hx HTN, atrial fib not on any coagulation P:  Repeat echo pending  PPM care per cardiology Norvasc, hydralazine  D/c lisinopril - poor drug in ICU setting, slightly worsening Scr  No B blocker  Trend troponin Gentle diuresis per cards  Place PICC   RENAL A:   AKI - setting of symptomatically bradycardia, hypertension Hypokalemia  P:   Trend BMP/UOP Place electrolytes as indicated  GASTROINTESTINAL A:   At risk protein calorie malnutrition P:   TF per nutrition  Pepcid for stress ulcer prophylaxis  HEMATOLOGIC A:   No acute issues Not an anti-coagulation candidate due to history of falls and SDH P:  Trend CBC SCDs for DVT  prophylaxis  INFECTIOUS A:   Asymmetric infiltrates - concern for aspiration Fever  P:   Monitor fever curve/WBC Empiric Unasyn for aspiration Trend PCT  ENDOCRINE A:   Diabetes mellitus  P:   Sliding scale insulin Q4 Add lantus 10 units daily 7/16   FAMILY  - Updates: family updated at length 7/16  - Inter-disciplinary family meet or Palliative Care meeting due by:  7/21   Evolving LARGE L MCA stroke, now with midline shift and small amt hemorrhage.  Continue goals of care discussions with family.   Dirk Dress, NP 03/13/2016  8:37 AM  Pager: [phone] or [phone]  Attending note: Neurology service discussing with family goals of care and likely will transition to comfort measures.    Coralyn Helling, MD [facility] Pulmonary/Critical Care 03/13/2016, 2:29 PM Pager:  [phone] After 3pm call: [phone]

## 2016-03-13 NOTE — Progress Notes (Signed)
RT Note: Pt had audible cuff leak, added air to cuff, not able to pass suction cath all the way, spoke with MD, advanced ETT to 26 at this time, pt has =BBS, good return VT, cuff leak better and able to pass suction cath.  RT will continue to monitor

## 2016-03-13 NOTE — Progress Notes (Addendum)
STROKE TEAM PROGRESS NOTE   HISTORY OF PRESENT ILLNESS (per record) Wlliam is s/p placement of a pacemaker. It was immediately noted that he had a dense right hemiplegia. The code team was urgently activated. He was taken to the CT suite where CT revealed slight effacement of the insular cortical region. The CTA was indicative of an occluded left M1. TPA was considered and discussed with the family. TPA was not performed as the patient has a recent history of bilateral craniotomy for SDH. In addition, the patient was noted to have complete right sided paralysis suggestive of severe CVA. There was some evidence of edema with some effacement of the insular cortex.  The IR team was consulted and felt intervention was indicated. Prior to IR the patient was taken to the cardiac ICU. After consultation with the ICU attending, it was felt best that [facility] be stabilized and intubated in a controlled ICU setting prior to IR.  The case, risks, and benefits were discussed with all available family members.  Recommendations were: 1) IR for left M2 occlusion 2. Admit to neuro ICU   Pre-procedure Diagnoses    Middle cerebral artery embolism, left [I66.02]     Post-procedure Diagnoses   Middle cerebral artery embolism, left [I66.02]     Procedures   IR ARCH CERVICICEBRAL NON-SEL (MS) [AVW0981 (Custom)]      Expand All Collapse All   S/P lt common carotid arteriogram,followed By complete revascularization of occluded Ltb MCA with x pass of solitaire 4 mm x 40 mm retrieval device With TICI 3 flow restoration     Past Medical History:  BILATERALCRANIOTOMY HEMATOMA EVACUATION SUBDURAL - 10/16/2014 - Dr Venetia Maxon   SUBJECTIVE (INTERVAL HISTORY) His son and daughter-in-law were at the bedside.   We discussed his condition.  His condition is worsened given cerebral edema and fever. Son requested family meeting later today for other siblings to have the opportunity to ask  questions   OBJECTIVE Temp:  [99.1 F (37.3 C)-102.9 F (39.4 C)] 102.9 F (39.4 C) (07/16 0800) Pulse Rate:  [59-61] 59 (07/16 0800) Cardiac Rhythm:  [-] Ventricular paced (07/15 2000) Resp:  [17-30] 26 (07/16 0800) BP: (105-176)/(49-77) 176/55 mmHg (07/16 0800) SpO2:  [94 %-100 %] 97 % (07/16 0810) Arterial Line BP: (50-172)/(37-104) 102/42 mmHg (07/16 0030) FiO2 (%):  [40 %-70 %] 50 % (07/16 0810) Weight:  [76.4 kg (168 lb 6.9 oz)] 76.4 kg (168 lb 6.9 oz) (07/16 0030)  CBC:   Recent Labs Lab 03/12/16 0515 03/13/16 0630  WBC 6.5 12.0*  NEUTROABS 5.5  --   HGB 10.3* 10.4*  HCT 31.8* 32.5*  MCV 85.7 87.1  PLT 182 PENDING    Basic Metabolic Panel:  Recent Labs Lab 03/12/16 0515  03/12/16 1700 03/13/16 0630  NA 142  --   --  140  K 3.4*  --   --  4.2  CL 111  --   --  109  CO2 22  --   --  21*  GLUCOSE 98  --   --  202*  BUN 29*  --   --  33*  CREATININE 1.44*  --   --  1.40*  CALCIUM 7.9*  --   --  8.1*  MG  --   < > 2.5* 2.3  PHOS  --   < > 3.2 2.3*  < > = values in this interval not displayed.  Lipid Panel:     Component Value Date/Time   CHOL 88 03/12/2016 0515  TRIG 47 03/12/2016 0515   HDL 37* 03/12/2016 0515   CHOLHDL 2.4 03/12/2016 0515   VLDL 9 03/12/2016 0515   LDLCALC 42 03/12/2016 0515   HgbA1c: No results found for: HGBA1C Urine Drug Screen: No results found for: LABOPIA, COCAINSCRNUR, LABBENZ, AMPHETMU, THCU, LABBARB    IMAGING Ct Head Wo Contrast 03/12/2016   1. Large evolving acute left MCA territory infarct with associated mass effect and new 4 mm of left-to-right shift. No hydrocephalus or ventricular trapping. 2. Small volume hyperdensity within the left sylvian fissure, favored to reflect residual contrast staining from recent revascularization procedure. Small amount of hemorrhage related to blood brain barrier breakdown due to ischemia or possibly vascular trauma from recent procedure may also be present.    Ct Angio Head and  Neck W Or Wo Contrast 02/29/2016   1. Left M1 occlusion with poor collateralization of the left MCA distribution.   No additional large vessel occlusion is identified.  2. Intracranial atherosclerosis with areas of mild-to-moderate stenosis of the anterior and posterior circulation. No aneurysm is identified.  3. Proximal occlusion of the right vertebral artery of the neck and irregularity with calcifications of the reconstituted upper right vertebral artery of the neck is probably on a chronic basis and related to atherosclerosis.  4. Moderate 50% stenosis of the proximal right internal carotid artery at the bifurcation secondary to calcified plaque.  5. Ground-glass opacities and septal thickening in the lung apices probably represents pulmonary edema, superimposed pneumonia is not excluded.    Ct Head Wo Contrast 03/13/2016   1. The left inferior insular cortex is partially obscured. There may be a small acute infarct in this area.  2. No other acute infarct.  The basal ganglia are intact.  3. Small posterior bilateral subdural collections. These likely represents residua of prior large subdural hematomas. ASPECTS score = 9/10 [place] Stroke Program Early CT Score Normal score = 10 Electronically Signed      Ct Head Wo Contrast 03/01/2016   1. No acute hemorrhage or large non-hemorrhagic infarct is present. Smaller nonhemorrhagic infarcts may be obscured by patient motion.    Dg Chest Port 1 View 03/08/2016   Interval probable bilateral alveolar edema. Pneumonia is less likely.   Dg Chest Port 1 View 03/03/2016   Mild cardiomegaly.  No active disease.  Dg Chest Port 1 View 03/12/2016 Similar appearance of bilateral interstitial and airspace disease. Unchanged endotracheal tube and gastric tube.  Dg Chest Port 1 View 03/13/2016 Stable support apparatus. Stable bilateral lung opacities as described above.  2 D Echocardiogram 03/12/2016 Study Conclusions - Left ventricle: The  cavity size was normal. Systolic function was  normal. The estimated ejection fraction was in the range of 50% to 55%. - Aortic root: The aortic root was mildly dilated. - Mitral valve: There was mild regurgitation. - Left atrium: The atrium was moderately dilated. - Right atrium: The atrium was mildly dilated. - Pulmonary arteries: Systolic pressure was moderately increased.  PA peak pressure: 52 mm Hg (S). Impressions: - No cardiac source of embolism was identified, but cannot be ruled out on the basis of this examination. Recommendations: Consider transesophageal echocardiography if clinically indicated.    PHYSICAL EXAM HEENT- Normocephalic, no lesions, without obvious abnormality. Normal external eye and conjunctiva.ET tube in place Cardiovascular- bradycardia  Lungs- chest clear, no wheezing, rales, normal symmetric air entry, Heart exam - S1, S2 normal, no murmur, no gallop, rate regular Abd:  Soft ND hypoactive bowel sounds Extrem:  No C/C/E  MS: Fentanyl bolus prior to exam due to agitation with ET tube adjustment and blood draws.  Sedated; follows no commands; does not opens eyes with stimulation ZO:XWRUE but sluggish; OCRs limited; corneals present; no gag Motor/Sensory: dense right hemiplegia; much less purposeful on left today Coordination and Gait: deferred   ASSESSMENT/PLAN Mr. Costas Sena is a 82 y.o. male with history of bilateral subdural hematoma evacuation 10/16/2014,  atrial fibrillation (not anticoagulated), diabetes mellitus, recent severe bradycardia, and hypertension, presenting with acute onset of left hemiplegia following pacemaker placement. He did not receive IV t-PA due to history of bilateral subdural hematomas. The patient was taken to interventional radiology and subsequent complete revascularization of the occluded left M1 segment.  Stroke:  Dominant infarct felt to be embolic secondary to atrial fibrillation.  Resultant  Right  hemiplegia and aphasia  MRI  - not performed due to PPM  MRA - not performed due to PPM  Carotid Doppler - refer to cerebral angiogram results.  2D Echo - EF 50-55%. No cardiac source of emboli identified.  LDL - 42  HgbA1c pending  VTE prophylaxis - SCDs Diet NPO time specified  No antithrombotic prior to admission, now on aspirin suppository 300 mg daily  Patient counseled to be compliant with his antithrombotic medications  Ongoing aggressive stroke risk factor management  Therapy recommendations: Pending  Disposition:  Pending  Hypertension  Blood pressure mildly low at times.  Initially BP management per IR orders.    Long-term BP goal normotensive  Hyperlipidemia  Home meds: Mevacor 40 mg daily changed to Pravachol 20 mg daily.  LDL  42, goal < 70  Continue Mevacor at discharge  Diabetes  HgbA1c pending, goal < 7.0  Uncontrolled:  Status pending labs  Other Stroke Risk Factors  Advanced age  Former cigarette smoker  Obesity, Body mass index is 23.5 kg/(m^2)., recommend weight loss, diet and exercise as appropriate    Other Active Problems  Bilateral craniotomies for SDH evacuation on 10/16/2014 Dr Venetia Maxon.  Mild hypokalemia; replete and follow - potassium now 4.2  Renal insufficiency - BUN 33 ; creatinine 1.40  Edema on chest x-ray Mar 13, 2016   PLAN   Antiplatelet therapy initiated in the evening.  Discussed the challenges of antiplatelet therapy versus anticoagulation with family today.  Explained the small amount of blood on CT and the increased risk for hemorrhagic transformation if NOAC in the setting of >1/3rd the arterial territory involved in the stroke.  They expressed understanding regarding current choice  Now on ASA suppository 300 mg daily  Lungs:  Will follow CXR - 03/13/2016 - Stable bilateral lung opacities.  However, patient spiking fevers and elevated WBC today.  First family meeting ending with high likelihoofd of focusing on  comfort/palliative measures.  If second family meeting of the day ends with decision to pursue aggressive care.  Will immediately address:  Venous access and concern for infection  H/H decreased will follow - (hemoglobin 10.3 ; hematocrit 31.8 on 03/12/2016) repeat 10.4/32.5  Hypokalemia resolved   Hospital day # 2   CRITICAL CARE NEUROLOGY ATTENDING NOTE Patient was seen and examined by me personally. I independently viewed imaging studies, participated in medical decision making and plan of care. The laboratory and radiographic studies were personally reviewed by me.  ROS pertinent positives could not be fully documented due to LOC  Assessment and plan completed by me personally and fully documented above.  Condition is worsened  This patient is critically ill and at significant risk of neurological  worsening, death and care requires constant monitoring of vital signs, hemodynamics,respiratory and cardiac monitoring, extensive review of multiple databases, frequent neurological assessment, discussion with family, other specialists and medical decision making of high complexity.  This critical care time does not reflect procedure time, or teaching time or supervisory time of PA/NP/Med Resident etc. but could involve care discussion time.  I spent 60 minutes of Neurocritical Care time in the care of  this patient.  SIGNED BY: Dr. Sula Soda    03/13/2016 Addendum  Family discussion with three children.  Patient now DNR and comfort measures only.  Orders written.  SIGNED:  Dr. Sula Soda  To contact Stroke Continuity provider, please refer to [email or web address]. After hours, contact General Neurology

## 2016-03-14 ENCOUNTER — Encounter (HOSPITAL_COMMUNITY): Payer: Self-pay | Admitting: Internal Medicine

## 2016-03-14 DIAGNOSIS — Z950 Presence of cardiac pacemaker: Secondary | ICD-10-CM

## 2016-03-14 DIAGNOSIS — Z515 Encounter for palliative care: Secondary | ICD-10-CM

## 2016-03-14 DIAGNOSIS — I63312 Cerebral infarction due to thrombosis of left middle cerebral artery: Secondary | ICD-10-CM

## 2016-03-14 LAB — HEMOGLOBIN A1C
HEMOGLOBIN A1C: 7.5 % — AB (ref 4.8–5.6)
MEAN PLASMA GLUCOSE: 169 mg/dL

## 2016-03-14 MED ORDER — ACETAMINOPHEN 650 MG RE SUPP
325.0000 mg | RECTAL | Status: DC | PRN
  Filled 2016-03-14: qty 1

## 2016-03-14 MED ORDER — IOPAMIDOL (ISOVUE-370) INJECTION 76%
50.0000 mL | Freq: Once | INTRAVENOUS | Status: AC | PRN
  Administered 2016-03-11: 50 mL via INTRAVENOUS

## 2016-03-14 MED ORDER — ACETAMINOPHEN 325 MG PO TABS: 325.0000 mg | ORAL_TABLET | ORAL | Status: DC | PRN

## 2016-03-14 MED FILL — Gentamicin Sulfate Inj 40 MG/ML: INTRAMUSCULAR | Qty: 2 | Status: AC

## 2016-03-14 MED FILL — Sodium Chloride Irrigation Soln 0.9%: Qty: 500 | Status: AC

## 2016-03-21 ENCOUNTER — Ambulatory Visit: Payer: Medicare HMO

## 2016-03-21 ENCOUNTER — Telehealth: Payer: Self-pay | Admitting: Internal Medicine

## 2016-03-21 NOTE — Telephone Encounter (Signed)
Original D/C received from [facility]- gave to [facility] to see if Dr.Taylor will sign

## 2016-03-24 ENCOUNTER — Telehealth: Payer: Self-pay | Admitting: Internal Medicine

## 2016-03-24 NOTE — Telephone Encounter (Signed)
Dr.Taylor signed and completed d/c for this patient -called [facility] at [phone] spoke with Janine Limbo she is aware ready for pick up.

## 2016-03-25 ENCOUNTER — Telehealth: Payer: Self-pay | Admitting: Internal Medicine

## 2016-03-25 NOTE — Telephone Encounter (Signed)
D/C picked up By [facility] on 03/24/16.

## 2016-03-29 NOTE — Discharge Summary (Signed)
Death Summary    Patient ID: Mason Stevens,  MRN: [id number], DOB/AGE: Apr 20, 1927 82 y.o.  Admit date: 02/29/2016 Discharge date: patient expired Apr 13, 2016  Primary Care Physician: [facility] Primary Cardiologist: none Electrophysiologist: Dr. Ladona Ridgel (new)  Primary Discharge Diagnosis:  1. Symptomatic profound bradycardia      status post pacemaker implantation this admission 2. Acute CVA 3. Persistent AFib 4. Respiratory failure 5. Acute kidney injury 6. Anemia  Secondary Discharge Diagnosis:  1. HTN 2. DM 3. BPH  No Known Allergies   Procedures This Admission:  1. Implantation of a SJM dual  chamber PPM on 03/10/2016 by Dr Ladona Ridgel.  The patient received a St. Jude (serial number [id number]) pacemaker and St. Jude (serial number [id number]) right atrial lead and a St. Jude (serial number [id number]) right ventricular lead. The procedure was complicated by altered mental status , acute CVA CXR on 03/12/16 demonstrated no pneumothorax status post device implantation. 2. ETT intubation 03/06/2016 3. 03/26/2016 S/P lt common carotid arteriogram,followed By complete revascularization of occluded Ltb MCA with  x pass of solitaire 4 mm x 40 mm retrieval device With TICI 3 flow restoration, Dr. Corliss Skains 4. 03/12/16: Echocardiogram    Impressions: - No cardiac source of embolism was identified, but cannot be ruled  out on the basis of this examination. 5. Extubation 03/13/16  Brief HPI: Mason Stevens is a 82 y.o. male  with PMHx of HTN, DM, BPH, peripheral neuropathy, and AFib not on anticoagulation with hx of traumatic b/l SDH Feb 2016, presented to [facility] 03/24/2016 with increasing fatigue, weakness, decreasing energy, noted to be bradycardic with  HR observed to be 20's-30's, was transferred to [facility] to undergo PPM implantation given no reversible causes were noted.  Hospital Course:  The patient was admitted to ICU bed, remained in AFib high 20's-30's with  stable BP, and no overt symptoms while supine and at rest, he underwent PPM implantation with Dr. Ladona Ridgel, intra/post procedure the patient was observed to have AMS, R deficit, code stroke was called, Emergent CT of the head was obtained followed by CTA was indicative of an occluded left M1. Neurology noted TPA was considered and discussed with the family. TPA was not performed as the patient has a recent history of bilateral craniotomy for SDH. The patient was intubated secondary to respiratory insufficiency and then brought to IR underwent complete revascularization of occluded Ltb MCA with x pass of solitaire 4 mm x 40 mm retrieval device With TICI 3 flow restoration.  Neurology noted they discussed the challenges of antiplatelet therapy versus anticoagulation with family today. Explained the small amount of blood on CT and the increased risk for hemorrhagic transformation if NOAC in the setting of >1/3rd the arterial territory involved in the stroke.The patient developed spiking fevers and findings of evolving LARGE L MCA stroke, with midline shift and small amt hemorrhage, and family decided to pursue Hospice care, comfort care measures only, the patient was extubated 03/13/16.  On 04-13-2016 neurology and CCM respectfully signed off the case noting family wishes.  Arrangements for transfer to home on Hospice were initiated with the aid of case management. The patient expired 2016-04-13 at 1434 before he could be discharged to home.   Physical Exam: Filed Vitals:   04-13-2016 1200 04-13-2016 1300 04-13-2016 1400 2016-04-13 1450  BP:      Pulse: 59 60 60   Temp:      TempSrc:      Resp: Height:  Weight:    160 lb 0.9 oz (72.6 kg)  SpO2: 82% 83% 83%     Labs:   Lab Results  Component Value Date   WBC 12.0* 03/13/2016   HGB 10.4* 03/13/2016   HCT 32.5* 03/13/2016   MCV 87.1 03/13/2016   PLT 117* 03/13/2016    Recent Labs Lab 12-Feb-2016 0840  03/13/16 0630  NA 138  < > 140  K 3.9   < > 4.2  CL 107  < > 109  CO2 19*  < > 21*  BUN 31*  < > 33*  CREATININE 1.33*  < > 1.40*  CALCIUM 9.3  < > 8.1*  PROT 6.8  --   --   BILITOT 0.8  --   --   ALKPHOS 60  --   --   ALT 16*  --   --   AST 30  --   --   GLUCOSE 143*  < > 202*  < > = values in this interval not displayed.  Follow up and medication list not applicable.   Disposition:  Expired.      Norma FredricksonSigned, [name], PA-C 02/27/2016 5:12 PM    EP Attending  Agree with the findings and documentation as noted above.   [name],M.D.

## 2016-03-29 NOTE — Progress Notes (Signed)
[facility] stopped breathing at the elevators, assessed by 2 RN's and patient was taken back to 3M04 to verify death with EKG strip. Patient expired at 1434, Dr. Jamison NeighborNestor at bedside along with 2 RN's who listened and verified time of death. Family notified. Case manager notified.  Hermina BartersBOWMAN, Ayo Smoak M, RN

## 2016-03-29 NOTE — Progress Notes (Signed)
PULMONARY / CRITICAL CARE MEDICINE   Name: Mason Stevens MRN: [id number] DOB: Jan 21, 1927    ADMISSION DATE:  03/28/2016 CONSULTATION DATE:  02/28/2016  REFERRING MD:  Dr. Ladona Ridgel   CHIEF COMPLAINT:  Respiratory Distress   HISTORY OF PRESENT ILLNESS:  82 year old male with a past medical history of BPH, peripheral neuropathy, atrial fibrillation (diagnosed 09/2014, not on anticoagulation secondary to falls) prior SDH status post evacuation, HTN, DM who presented to [facility] ER on 7/14 with complaints of "feeling funny". Found to be profoundly bradycardic with HR 20's. He was tx to [facility] for pacemaker placement. During pacemaker placement he was noted to have a change in work of breathing, AMS and decreased movement on the right. Pacemaker was placed. Emergent CT of the head showed no acute hemorrhage or nonhemorrhagic infarct. Per stroke RN, CT angiogram of the head and neck showed concerns for distal M1 infarct.  He was emergently returned to ICU for intubation in the setting of respiratory distress for airway protection.  Overnight 7/14 he underwent IR revascularization of occluded L MCA.    SUBJECTIVE/Overnight:  Nor comfort care.  Extubated overnight.  Comfortable on fent gtt.  Family at bedside.   VITAL SIGNS: BP 164/68 mmHg  Pulse 62  Temp(Src) 102.9 F (39.4 C) (Axillary)  Resp 23  Ht  (1.803 m)  Wt 76.4 kg (168 lb 6.9 oz)  BMI 23.50 kg/m2  SpO2 84%   INTAKE / OUTPUT: I/O last 3 completed shifts: In: 2407.4 [I.V.:1277; Other:110; NG/GT:765.4; IV Piggyback:255] Out: 2720 [Urine:2720]  PHYSICAL EXAMINATION: General:  Elderly male, NAD  Neuro: minimally responsive, appears comfortable on fent gtt.   HEENT:  MM pink/moist, no JVD, ETT Cardiovascular:  s1s2 distant Lungs:  resps even non labored on Echo, RR 16, coarse  Abdomen:  Soft, BSx4 active Musculoskeletal:  No acute deformities, left upper extremity in sling post pacemaker placement, R groin dressing  c/d, groin soft  Skin:  Warm and dry, no edema   LABS:  BMET  Recent Labs Lab 03/01/2016 0840  03/23/2016 1807 03/24/2016 1836 03/12/16 0515 03/13/16 0630  NA 138  < > 144 142 142 140  K 3.9  < > 4.1 4.0 3.4* 4.2  CL 107  --   --   --  111 109  CO2 19*  --   --   --  22 21*  BUN 31*  --   --   --  29* 33*  CREATININE 1.33*  --   --   --  1.44* 1.40*  GLUCOSE 143*  --  165*  --  98 202*  < > = values in this interval not displayed.  Electrolytes  Recent Labs Lab 03/15/2016 0840 03/12/16 0515 03/12/16 0822 03/12/16 1700 03/13/16 0630  CALCIUM 9.3 7.9*  --   --  8.1*  MG  --   --  1.4* 2.5* 2.3  PHOS  --   --  3.2 3.2 2.3*    CBC  Recent Labs Lab 02/28/2016 0840  03/13/2016 1836 03/12/16 0515 03/13/16 0630  WBC 8.8  --   --  6.5 12.0*  HGB 13.0  < > 11.6* 10.3* 10.4*  HCT 39.2*  < > 34.0* 31.8* 32.5*  PLT 209  --   --  182 117*  < > = values in this interval not displayed.  Coag's  Recent Labs Lab 03/09/2016 0840  APTT 33  INR 0.99    Sepsis Markers  Recent Labs Lab 03/10/2016 1705 03/12/16  0515 03/13/16 0630  PROCALCITON 0.39 10.41 14.99    ABG  Recent Labs Lab 03/12/2016 2202 03/12/16 0629 03/12/16 1020  PHART 7.309* 7.533* 7.450  PCO2ART 34.6* 21.7* 31.7*  PO2ART 44.0* 198* 115*    Liver Enzymes  Recent Labs Lab 03/07/2016 0840  AST 30  ALT 16*  ALKPHOS 60  BILITOT 0.8  ALBUMIN 3.8    Cardiac Enzymes  Recent Labs Lab 03/21/2016 0840  TROPONINI 0.09*    Glucose  Recent Labs Lab 03/12/16 1219 03/12/16 1604 03/12/16 2045 03/13/16 0002 03/13/16 0331 03/13/16 0828  GLUCAP 84 95 107* 205* 239* 242*    Imaging No results found.   STUDIES:  CT head 7/14 >> no acute hemorrhage CTA head/neck 7/14 >> distal M1 infarct CT head 7/15>>> 1. Large evolving acute left MCA territory infarct with associated mass effect and new 4 mm of left-to-right shift. No hydrocephalus or ventricular trapping. 2. Small volume hyperdensity within  the left sylvian fissure, favored to reflect residual contrast staining from recent revascularization procedure. Small amount of hemorrhage related to blood brain barrier breakdown due to ischemia or possibly vascular trauma from recent procedure may also be present.  CULTURES:   ANTIBIOTICS: Unasyn 7/14>>>  SIGNIFICANT EVENTS: 7/14  [facility] symptomatically [facility]ady[facility].  Developed right-sided weakness and rest for distress during cath, CTA of the head concerning for distal M1 infarct.  To neuro IR 7/14 IR revascularization of occluded L MCA  LINES/TUBES: ETT 7/14 >>  DISCUSSION: 82 year old male with a past medical history of atrial fibrillation not on anticoagulation, diabetes and hypertension who was admitted 7/14 with symptomatically bradycardia S/P PPM who developed right-sided weakness and respiratory distress during procedure. Emergent CT of the head concerning for distal M1 infarct.  Intubated emergently post CT.  ASSESSMENT / PLAN:  -Acute hypoxic respiratory failure  -Distal L MCA infarct - s/p IR revascularization -- large, evolving infarct, worsening head CT 7/15 with new midline shift and small amt hemorrhage.  -Symptomatically bradycardia status post PPM 7/14 -Hx HTN, atrial fib not on any coagulation -AKI - setting of symptomatically bradycardia, hypertension -aspiration PNA  PLAN -  Now comfort care, extubated for comfort 7/16 Continue fentanyl gtt - titrate for comfort  PRN ativan  Family support  Ok to move out of unit from [employer]PCCM standpoint   PCCM signing off, please call back if needed.   Dirk DressKaty Whiteheart, NP 26-Mar-2016  8:51 AM Pager: [phone] or [phone]  PCCM Attending Note: Patient seen and examined with nurse practitioner. Please refer to her progress note which I have reviewed in detail. Patient transitioned to full comfort care and extubated overnight. Patient seen at 1433 PM with absent spontaneous respirations and no palpable  pulse on my exam. Family to be notified by nursing staff. Magnet placed over patient's pacemaker to deactivate.  I have spent a total of 31 minutes of critical care time today caring for the patient and reviewing the patient's electronic medical record.  Donna ChristenJennings E. Jamison NeighborNestor, M.D. [facility] Pager:  [phone] After 3pm or if no response, call [phone] 12:48 PM 029-Jul-2017

## 2016-03-29 NOTE — Clinical Social Work Note (Signed)
CSW received call from unit stating that [facility] called regarding the patient. Per chart it does not appear a referral was made by CSW department for residential hospice placement. CSW contacted [facility]. Their notes indicate that the patient's son Mason Stevens called [facility] and requested assistance with getting the patient home with hospice services to spend his final days at home. CSW contacted unit RNCM regarding this need. Please re-consult CSW if CSW related needs arise.    Roddie McBryant [place] Flett MSW, TorringtonLCSW, [place], [phone]

## 2016-03-29 NOTE — Progress Notes (Signed)
STROKE TEAM PROGRESS NOTE   SUBJECTIVE (INTERVAL HISTORY) His son and daughter-in-law were at the bedside. Family has decided on comfort care measures. Answered questions from family. Pending home hospice.    OBJECTIVE Pulse Rate:  [55-62] 62 (07/17 0800) Cardiac Rhythm:  [-] Ventricular paced (07/17 0818) Resp:  [23-40] 23 (07/17 0800) BP: (164-175)/(68-83) 164/68 mmHg (07/16 1100) SpO2:  [74 %-100 %] 84 % (07/17 0800) FiO2 (%):  [50 %] 50 % (07/16 1223)  CBC:   Recent Labs Lab 03/12/16 0515 03/13/16 0630  WBC 6.5 12.0*  NEUTROABS 5.5  --   HGB 10.3* 10.4*  HCT 31.8* 32.5*  MCV 85.7 87.1  PLT 182 117*    Basic Metabolic Panel:  Recent Labs Lab 03/12/16 0515  03/12/16 1700 03/13/16 0630  NA 142  --   --  140  K 3.4*  --   --  4.2  CL 111  --   --  109  CO2 22  --   --  21*  GLUCOSE 98  --   --  202*  BUN 29*  --   --  33*  CREATININE 1.44*  --   --  1.40*  CALCIUM 7.9*  --   --  8.1*  MG  --   < > 2.5* 2.3  PHOS  --   < > 3.2 2.3*  < > = values in this interval not displayed.  Lipid Panel:     Component Value Date/Time   CHOL 88 03/12/2016 0515   TRIG 47 03/12/2016 0515   HDL 37* 03/12/2016 0515   CHOLHDL 2.4 03/12/2016 0515   VLDL 9 03/12/2016 0515   LDLCALC 42 03/12/2016 0515   HgbA1c:  Lab Results  Component Value Date   HGBA1C 7.5* 03/12/2016   Urine Drug Screen: No results found for: LABOPIA, COCAINSCRNUR, LABBENZ, AMPHETMU, THCU, LABBARB    IMAGING I have personally reviewed the radiological images below and agree with the radiology interpretations.  Ct Head Wo Contrast 03/12/2016   1. Large evolving acute left MCA territory infarct with associated mass effect and new 4 mm of left-to-right shift. No hydrocephalus or ventricular trapping. 2. Small volume hyperdensity within the left sylvian fissure, favored to reflect residual contrast staining from recent revascularization procedure. Small amount of hemorrhage related to blood brain barrier  breakdown due to ischemia or possibly vascular trauma from recent procedure may also be present.   Ct Angio Head and Neck W Or Wo Contrast 03/20/2016   1. Left M1 occlusion with poor collateralization of the left MCA distribution.   No additional large vessel occlusion is identified.  2. Intracranial atherosclerosis with areas of mild-to-moderate stenosis of the anterior and posterior circulation. No aneurysm is identified.  3. Proximal occlusion of the right vertebral artery of the neck and irregularity with calcifications of the reconstituted upper right vertebral artery of the neck is probably on a chronic basis and related to atherosclerosis.  4. Moderate 50% stenosis of the proximal right internal carotid artery at the bifurcation secondary to calcified plaque.  5. Ground-glass opacities and septal thickening in the lung apices probably represents pulmonary edema, superimposed pneumonia is not excluded.   Ct Head Wo Contrast 03/17/2016   1. The left inferior insular cortex is partially obscured. There may be a small acute infarct in this area.  2. No other acute infarct.  The basal ganglia are intact.  3. Small posterior bilateral subdural collections. These likely represents residua of prior large subdural hematomas. ASPECTS  score = 9/10 [place] Stroke Program Early CT Score Normal score = 10 Electronically Signed     Ct Head Wo Contrast 03/21/2016   1. No acute hemorrhage or large non-hemorrhagic infarct is present. Smaller nonhemorrhagic infarcts may be obscured by patient motion.   2 D Echocardiogram 03/12/2016 Study Conclusions - Left ventricle: The cavity size was normal. Systolic function was  normal. The estimated ejection fraction was in the range of 50% to 55%. - Aortic root: The aortic root was mildly dilated. - Mitral valve: There was mild regurgitation. - Left atrium: The atrium was moderately dilated. - Right atrium: The atrium was mildly dilated. - Pulmonary arteries:  Systolic pressure was moderately increased.  PA peak pressure: 52 mm Hg (S). Impressions: - No cardiac source of embolism was identified, but cannot be ruled out on the basis of this examination. Recommendations: Consider transesophageal echocardiography if clinically indicated.   PHYSICAL EXAM - limited exam due to comfort care HEENT- Normocephalic, no lesions, without obvious abnormality. Normal external eye and conjunctiva. Cardiovascular- bradycardia  Lungs- chest clear, no wheezing, rales, normal symmetric air entry, Heart exam - S1, S2 normal, no murmur, no gallop, rate regular Abd:  Soft ND hypoactive bowel sounds Extrem:  No C/C/E  MS: on Fentanyl IV for comfort care. No response to voice; follows no commands; does not opens eyes with stimulation JX:BJYNWCN:PERRL but sluggish; spontaneous breathing, right facial droop.  Motor/Sensory: no spontaneous movement seen. Coordination and Gait: not tested.   ASSESSMENT/PLAN [name] is a 82 y.o. male with history of bilateral subdural hematoma evacuation 10/16/2014,  atrial fibrillation (not anticoagulated), diabetes mellitus, recent severe bradycardia, and hypertension, presenting with acute onset of left hemiplegia following pacemaker placement. He did not receive IV t-PA due to history of bilateral subdural hematomas. The patient was taken to interventional radiology and subsequent complete revascularization of the occluded left M1 segment.  Stroke:  Dominant infarct felt to be embolic secondary to atrial fibrillation.  Resultant  Right hemiplegia and aphasia  CTA head and neck showed left M1 occlusion  IR showed left MI recannulization   2D Echo - EF 50-55%. No cardiac source of emboli identified.  LDL - 42  HgbA1c 7.5  VTE prophylaxis - SCDs Diet NPO time specified  No antithrombotic prior to admission, now on no antithrombotic due to comfort care measure   Disposition:  Home hospice  afib not on  [facility]  SDH s/p fall and evacuation  No AC due to comfort care and recent SDH  Hypertension stable  Hyperlipidemia  Home meds: Mevacor 40 mg daily changed to Pravachol 20 mg daily.  LDL 42, goal < 70  Continue Mevacor at discharge  Diabetes  HgbA1c 7.5, goal < 7.0  Other Stroke Risk Factors  Advanced age  Former cigarette smoker  Other Active Problems  Bilateral craniotomies for SDH evacuation on 10/16/2014 Dr Venetia MaxonStern.  Mild hypokalemia; replete and follow - potassium now 4.2  Renal insufficiency - BUN 33 ; creatinine 1.40  Edema on chest x-ray 03/16/2016   Hospital day # 3  Agree with comfort care. Family's questions were answered. Neurology will sign off. Please call with questions. Thanks for the consult.  Marvel PlanJindong Asiah Befort, MD PhD Stroke Neurology 07-17-2016 9:59 AM   To contact Stroke Continuity provider, please refer to [email or web address]. After hours, contact General Neurology

## 2016-03-29 NOTE — Progress Notes (Signed)
Notified by bedside nurse that pt has expired.  Family has been made aware, and are to return to hospital.  Notified Debbie at [facility], who will cancel referral.    Quintella BatonJulie W. Ariel Dimitri, RN, BSN  Trauma/Neuro ICU Case Manager [phone]

## 2016-03-29 NOTE — Care Management Important Message (Signed)
Important Message  Patient Details  Name: Mason Stevens MRN: [id number] Date of Birth: 14-Apr-1927   Medicare Important Message Given:  Yes    Kyla BalzarineShealy, Taimi Towe Abena 02-10-16, 3:08 PM

## 2016-03-29 NOTE — Progress Notes (Signed)
200 mls of fentanyl wasted in sink with Thomes CakeMari Furr, RN.  Hermina BartersBOWMAN, Moneisha Vosler M, RN

## 2016-03-29 NOTE — Care Management Note (Signed)
Case Management Note  Patient Details  Name: Mason Stevens MRN: [id number] Date of Birth: 1927-02-22  Subjective/Objective:  Pt admitted on 03/24/2016 with symptomatic bradycardia requiring pacemaker.  Pt sustained post procedure stroke, and is now comfort care.  Family wishes to take pt home with Hospice services.                    Action/Plan: Met with family this morning to offer choice for Hospice provider.  They prefer [facility], as they used this agency with pt's wife.  Son is main contact:  Theodore Virgin, [phone].  Pt needs home oxygen and hospital bed for home.  Called referral in to [facility] at preferred Hospice agency.  Faxed H&P, face sheet, progress notes and oxygen order, per her request.  Hopeful to be able to transition pt to home today, pending expediting needed equipment to pt's home.  Await call from [facility] as to whether DME will be able to be delivered today.  Will follow up.    Expected Discharge Date:                  Expected Discharge Plan:  Home w Hospice Care  In-House Referral:     Discharge planning Services  CM Consult  Post Acute Care Choice:  Hospice Choice offered to:  Adult Children  DME Arranged:  Oxygen, Hospital bed DME Agency:  [facility]  HH Arranged:  RN, Social Work, Nurse's Aide [place] Agency:  [facility]  Status of Service:  In process, will continue to follow  If discussed at Long Length of Stay Meetings, dates discussed:    Additional Comments:  -Reinaldo Raddle, RN, BSN  Trauma/Neuro ICU Case Manager [phone]

## 2016-03-29 NOTE — Progress Notes (Signed)
Patient ID: Mason Stevens, male   DOB: 09/20/1926, 82 y.o.   MRN: [id number]    Patient Name: Mason Stevens Date of Encounter: 04-05-2016     Active Problems:   Bradycardia   CHB (complete heart block) (HCC)   Symptomatic bradycardia   Acute respiratory failure (HCC)   Stroke [facility])   Cerebrovascular accident (CVA) due to thrombosis of left middle cerebral artery (HCC)   Pacemaker   Cerebral edema (HCC)    SUBJECTIVE  Remains unresponsive, extubated, breathing on his own.  CURRENT MEDS .  stroke: mapping our early stages of recovery book   Does not apply Once    OBJECTIVE  Filed Vitals:   04/05/16 0300 April 05, 2016 0400 04/05/16 0500 2016/04/05 0600  BP:      Pulse: 60 60 60 59  Temp:      TempSrc:      Resp: 30 28 28 27   Height:      Weight:      SpO2: 84% 85% 85% 88%    Intake/Output Summary (Last 24 hours) at 2016/04/05 0744 Last data filed at 05-Apr-2016 0600  Gross per 24 hour  Intake 826.96 ml  Output    685 ml  Net 141.96 ml   Filed Weights   03/13/16 0030  Weight: 168 lb 6.9 oz (76.4 kg)    PHYSICAL EXAM  General: somnolent Neuro: unresponsive  Neck: Supple without bruits or JVD. Lungs:  Resp regular and unlabored,  Heart: RRR no s3, s4, or murmurs. Abdomen: Soft, non-tender, non-distended, BS + x 4.  Extremities: No clubbing, cyanosis or edema. DP/PT/Radials 2+ and equal bilaterally.  Accessory Clinical Findings  CBC  Recent Labs  03/12/16 0515 03/13/16 0630  WBC 6.5 12.0*  NEUTROABS 5.5  --   HGB 10.3* 10.4*  HCT 31.8* 32.5*  MCV 85.7 87.1  PLT 182 117*   Basic Metabolic Panel  Recent Labs  03/12/16 0515  03/12/16 1700 03/13/16 0630  NA 142  --   --  140  K 3.4*  --   --  4.2  CL 111  --   --  109  CO2 22  --   --  21*  GLUCOSE 98  --   --  202*  BUN 29*  --   --  33*  CREATININE 1.44*  --   --  1.40*  CALCIUM 7.9*  --   --  8.1*  MG  --   < > 2.5* 2.3  PHOS  --   < > 3.2 2.3*  < > = values in this interval not  displayed. Liver Function Tests  Recent Labs  02/27/2016 0840  AST 30  ALT 16*  ALKPHOS 60  BILITOT 0.8  PROT 6.8  ALBUMIN 3.8   No results for input(s): LIPASE, AMYLASE in the last 72 hours. Cardiac Enzymes  Recent Labs  03/13/2016 0840  TROPONINI 0.09*   BNP Invalid input(s): POCBNP D-Dimer No results for input(s): DDIMER in the last 72 hours. Hemoglobin A1C No results for input(s): HGBA1C in the last 72 hours. Fasting Lipid Panel  Recent Labs  03/12/16 0515  CHOL 88  HDL 37*  LDLCALC 42  TRIG 47  CHOLHDL 2.4   Thyroid Function Tests No results for input(s): TSH, T4TOTAL, T3FREE, THYROIDAB in the last 72 hours.  Invalid input(s): FREET3  TELE  Atrial fib with ventricular pacing  Radiology/Studies  Ct Angio Head W Or Wo Contrast  03/23/2016  CLINICAL DATA:  Stroke code. EXAM: CT ANGIOGRAPHY  HEAD AND NECK TECHNIQUE: Multidetector CT imaging of the head and neck was performed using the standard protocol during bolus administration of intravenous contrast. Multiplanar CT image reconstructions and MIPs were obtained to evaluate the vascular anatomy. Carotid stenosis measurements (when applicable) are obtained utilizing NASCET criteria, using the distal internal carotid diameter as the denominator. CONTRAST:  50 cc Isovue 370. COMPARISON:  None. FINDINGS: CTA NECK Aortic arch: Mild calcific atherosclerosis of the aortic arch. Right carotid system: Moderate plaque at the bifurcation with moderate 50% stenosis of the proximal right internal carotid artery. Left carotid system: Mild plaque at the bifurcation without hemodynamically significant stenosis. Vertebral arteries:No appreciable right vertebral artery from the origin to the C3 level. The vertebral artery above the C3 level is irregular and attenuated with a short segment of severe stenosis and calcification at the craniocervical junction. The left vertebral artery is widely patent. Skeleton: Moderate multilevel cervical  spondylosis greatest at the C5-C7 levels with prominent right-sided facet arthrosis component. No acute osseous abnormality is identified. Other neck: Ground-glass opacities and septal thickening within the lung apices probably reflects pulmonary edema. Superimposed pneumonia is not excluded. Debris within the upper esophagus. No discrete cervical mass or lymphadenopathy is identified. The thyroid gland is unremarkable. The aerodigestive tract is patent without a discrete exophytic mass identified. Left-sided pacemaker with leads extending below the field of view in the superior vena cava. CTA HEAD Anterior circulation: Attenuation of proximal left M1 with distal occlusion and no opacification superior or inferior M2 divisions. Poor left MCA collateralization. Bilateral ACA and right MCA are patent. There are short segments of mild stenosis in the right proximal MCA. Bilateral internal carotid arteries are patent. Mild calcific atherosclerosis of cavernous segments without significant stenosis. Small anterior communicating artery. Small left posterior communicating artery possible diminutive right posterior communicating artery. Posterior circulation: Left dominant vertebrobasilar system. Patent vertebral and basilar arteries. Bilateral posterior cerebral arteries are patent. Short segment of moderate stenosis within the proximal basilar artery. Mild irregularity of bilateral proximal posterior cerebral arteries with areas of mild stenosis likely related to atherosclerosis. Venous sinuses: No venous sinus thrombosis is identified. Anatomic variants: None. Calvarium and skull base: Postsurgical changes related to bilateral frontal craniotomies again seen. The calvarium is otherwise unremarkable. IMPRESSION: 1. Left M1 occlusion with poor collateralization of the left MCA distribution. No additional large vessel occlusion is identified. 2. Intracranial atherosclerosis with areas of mild-to-moderate stenosis of the  anterior and posterior circulation. No aneurysm is identified. 3. Proximal occlusion of the right vertebral artery of the neck and irregularity with calcifications of the reconstituted upper right vertebral artery of the neck is probably on a chronic basis and related to atherosclerosis. 4. Moderate 50% stenosis of the proximal right internal carotid artery at the bifurcation secondary to calcified plaque. 5. Ground-glass opacities and septal thickening in the lung apices probably represents pulmonary edema, superimposed pneumonia is not excluded. Critical Value/emergent results were called by telephone at the time of interpretation on 30-Apr-2016 at 4:47 pm to Dr. Felicie MornAVID SMITH , who verbally acknowledged these results. Electronically Signed   By: Mitzi HansenLance  Furusawa-Stratton M.D.   On: 002-Sep-2017 17:03   Ct Head Wo Contrast  03/12/2016  CLINICAL DATA:  Initial evaluation for acute stroke, status post endovascular revascularization. EXAM: CT HEAD WITHOUT CONTRAST TECHNIQUE: Contiguous axial images were obtained from the base of the skull through the vertex without intravenous contrast. COMPARISON:  Prior CT from 002-Sep-2017. FINDINGS: Extensive evolving cytotoxic edema now seen throughout much of the left MCA  territory, consistent with evolving ischemic infarct. There is involvement of the left frontal, parietal, and temporal lobes, with involvement of the insular cortex. Scattered basal ganglia involvement of the left lentiform nucleus. Associated edema with sulcal effacement. Mild of mass effect on the left lateral ventricle which is partially effaced. Associated left-to-right shift measures up to 5 mm. No hydrocephalus or ventricular trapping. Scattered hyperdensity with within the left sylvian fissure likely reflects small amount of contrast staining from recent arteriogram. Possible small amount of hemorrhage related to blood brain barrier breakdown may also be present. No other areas of hemorrhage. Cerebral atrophy  with chronic small vessel ischemic disease noted. Remote right cerebellar infarcts present. Chronic hypodense small subdural hygroma again seen, measuring 7 mm at the right parietal convexity. No mass lesion. Scalp soft tissues within normal limits. No acute abnormality about the globes and orbits. Paranasal sinuses are clear. Patient is intubated. No mastoid effusion. Sequela prior bifrontal craniotomy again noted. Calvarium otherwise intact. IMPRESSION: 1. Large evolving acute left MCA territory infarct with associated mass effect and new 4 mm of left-to-right shift. No hydrocephalus or ventricular trapping. 2. Small volume hyperdensity within the left sylvian fissure, favored to reflect residual contrast staining from recent revascularization procedure. Small amount of hemorrhage related to blood brain barrier breakdown due to ischemia or possibly vascular trauma from recent procedure may also be present. Electronically Signed   By: Rise Mu M.D.   On: 03/12/2016 22:02   Ct Head Wo Contrast  2016/04/08  CLINICAL DATA:  Status post revascularization of the left middle cerebral artery. Previous bilateral subdural hematomas. EXAM: CT HEAD WITHOUT CONTRAST TECHNIQUE: Contiguous axial images were obtained from the base of the skull through the vertex without intravenous contrast. COMPARISON:  CT head and CTA head from the same day. CT head without contrast 01/07/2015 FINDINGS: The left insular cortex is partially obscured. The basal ganglia are intact. No other focal cortical infarct is present. The thalami are intact. Remote infarcts of the right cerebellum are stable. No acute hemorrhage is present. Small bilateral subdural collections are noted over the posterior convexities. These are likely chronic. Patient is intubated. The OG tube is in place. The paranasal sinuses mastoid air cells are clear. The calvarium is intact. Bilateral craniotomies are associated with previous subdural evacuation sub.  The globes and orbits are within normal limits. IMPRESSION: 1. The left inferior insular cortex is partially obscured. There may be a small acute infarct in this area. 2. No other acute infarct.  The basal ganglia are intact. 3. Small posterior bilateral subdural collections. These likely represents residua of prior large subdural hematomas. ASPECTS score = 9/10 [place] Stroke Program Early CT Score Normal score = 10 Electronically Signed   By: Marin Roberts M.D.   On: 2016/04/08 20:03   Ct Head Wo Contrast  April 08, 2016  CLINICAL DATA:  Code stroke. EXAM: CT HEAD WITHOUT CONTRAST TECHNIQUE: Contiguous axial images were obtained from the base of the skull through the vertex without intravenous contrast. COMPARISON:  01/07/2015 FINDINGS: Brain: Markedly diminished exam detail secondary to motion artifact. Patient moving during examination. Unable to maintain stillness. No evidence of acute infarction, hemorrhage, extra-axial collection, ventriculomegaly, or mass effect. Vascular: No hyperdense vessel or unexpected calcification. Skull: Bilateral craniotomies have been performed. Sinuses/Orbits: No acute findings. Other: None. IMPRESSION: 1. No acute hemorrhage or large non-hemorrhagic infarct is present. Smaller nonhemorrhagic infarcts may be obscured by patient motion. Electronically Signed   By: Signa Kell M.D.   On: 04-08-16 16:28  Ct Angio Neck W Or Wo Contrast  03/28/2016  CLINICAL DATA:  Stroke code. EXAM: CT ANGIOGRAPHY HEAD AND NECK TECHNIQUE: Multidetector CT imaging of the head and neck was performed using the standard protocol during bolus administration of intravenous contrast. Multiplanar CT image reconstructions and MIPs were obtained to evaluate the vascular anatomy. Carotid stenosis measurements (when applicable) are obtained utilizing NASCET criteria, using the distal internal carotid diameter as the denominator. CONTRAST:  50 cc Isovue 370. COMPARISON:  None. FINDINGS: CTA NECK  Aortic arch: Mild calcific atherosclerosis of the aortic arch. Right carotid system: Moderate plaque at the bifurcation with moderate 50% stenosis of the proximal right internal carotid artery. Left carotid system: Mild plaque at the bifurcation without hemodynamically significant stenosis. Vertebral arteries:No appreciable right vertebral artery from the origin to the C3 level. The vertebral artery above the C3 level is irregular and attenuated with a short segment of severe stenosis and calcification at the craniocervical junction. The left vertebral artery is widely patent. Skeleton: Moderate multilevel cervical spondylosis greatest at the C5-C7 levels with prominent right-sided facet arthrosis component. No acute osseous abnormality is identified. Other neck: Ground-glass opacities and septal thickening within the lung apices probably reflects pulmonary edema. Superimposed pneumonia is not excluded. Debris within the upper esophagus. No discrete cervical mass or lymphadenopathy is identified. The thyroid gland is unremarkable. The aerodigestive tract is patent without a discrete exophytic mass identified. Left-sided pacemaker with leads extending below the field of view in the superior vena cava. CTA HEAD Anterior circulation: Attenuation of proximal left M1 with distal occlusion and no opacification superior or inferior M2 divisions. Poor left MCA collateralization. Bilateral ACA and right MCA are patent. There are short segments of mild stenosis in the right proximal MCA. Bilateral internal carotid arteries are patent. Mild calcific atherosclerosis of cavernous segments without significant stenosis. Small anterior communicating artery. Small left posterior communicating artery possible diminutive right posterior communicating artery. Posterior circulation: Left dominant vertebrobasilar system. Patent vertebral and basilar arteries. Bilateral posterior cerebral arteries are patent. Short segment of moderate  stenosis within the proximal basilar artery. Mild irregularity of bilateral proximal posterior cerebral arteries with areas of mild stenosis likely related to atherosclerosis. Venous sinuses: No venous sinus thrombosis is identified. Anatomic variants: None. Calvarium and skull base: Postsurgical changes related to bilateral frontal craniotomies again seen. The calvarium is otherwise unremarkable. IMPRESSION: 1. Left M1 occlusion with poor collateralization of the left MCA distribution. No additional large vessel occlusion is identified. 2. Intracranial atherosclerosis with areas of mild-to-moderate stenosis of the anterior and posterior circulation. No aneurysm is identified. 3. Proximal occlusion of the right vertebral artery of the neck and irregularity with calcifications of the reconstituted upper right vertebral artery of the neck is probably on a chronic basis and related to atherosclerosis. 4. Moderate 50% stenosis of the proximal right internal carotid artery at the bifurcation secondary to calcified plaque. 5. Ground-glass opacities and septal thickening in the lung apices probably represents pulmonary edema, superimposed pneumonia is not excluded. Critical Value/emergent results were called by telephone at the time of interpretation on 03/06/2016 at 4:47 pm to Dr. Felicie Morn , who verbally acknowledged these results. Electronically Signed   By: Mitzi Hansen M.D.   On: 03/16/2016 17:03   Dg Chest Port 1 View  03/13/2016  CLINICAL DATA:  Ventilator dependence. EXAM: PORTABLE CHEST 1 VIEW COMPARISON:  Radiograph of March 12, 2016. FINDINGS: Endotracheal and nasogastric tubes are stable in position. Left-sided pacemaker is unchanged. No pneumothorax is noted. Bony  thorax is unremarkable. Stable bilateral lung opacities are noted concerning for edema, atelectasis or inflammation. Cardiomediastinal silhouette is unremarkable. Bony thorax is unremarkable. IMPRESSION: Stable support apparatus.  Stable bilateral lung opacities as described above. Electronically Signed   By: Lupita Raider, M.D.   On: 03/13/2016 07:44   Portable Chest Xray  03/12/2016  CLINICAL DATA:  82 year old male with a history of respiratory distress EXAM: PORTABLE CHEST 1 VIEW COMPARISON:  03/06/2016, 10/17/2014 FINDINGS: Cardiomediastinal silhouette unchanged in size and contour. Persisting bilateral mixed interstitial and airspace opacities, on the right predominantly upper lung, on the left, predominantly lower lung. Unchanged endotracheal tube, terminating above the carina 4.9 cm. Unchanged cardiac pacing device on left chest wall. Unchanged gastric tube terminating out of the field of view. No pneumothorax. IMPRESSION: Similar appearance of bilateral interstitial and airspace disease. Unchanged endotracheal tube and gastric tube. Signed, Yvone Neu. Loreta Ave, DO [facility] Radiology Electronically Signed   By: Gilmer Mor D.O.   On: 03/12/2016 09:47   Dg Chest Port 1 View  03/17/2016  CLINICAL DATA:  Acute respiratory failure. Intubated. Complete heart block. EXAM: PORTABLE CHEST 1 VIEW COMPARISON:  Earlier today. FINDINGS: Interval endotracheal tube in satisfactory position. Normal sized heart. Interval patchy airspace opacity throughout the majority of the right lung and in the left perihilar region. Possible minimal right pleural fluid. Thoracic spine degenerative changes. Interval left subclavian bipolar pacemaker with the leads in satisfactory position. No pneumothorax. Interval nasogastric tube extending into the stomach. IMPRESSION: Interval probable bilateral alveolar edema. Pneumonia is less likely. Electronically Signed   By: Beckie Salts M.D.   On: 02/27/2016 17:10   Dg Chest Port 1 View  03/12/2016  CLINICAL DATA:  Bradycardia. EXAM: PORTABLE CHEST 1 VIEW COMPARISON:  10/17/2014 FINDINGS: Mild cardiomegaly. Lungs are clear. No effusions or acute bony  abnormality. IMPRESSION: Mild cardiomegaly.  No active disease. Electronically Signed   By: Charlett Nose M.D.   On: 03/23/2016 08:41   Dg Abd Portable 1v  03/12/2016  CLINICAL DATA:  OG tube placement. EXAM: PORTABLE ABDOMEN - 1 VIEW COMPARISON:  None. FINDINGS: An OG tube is identified with tip overlying the mid stomach. The bowel gas pattern is unremarkable. IMPRESSION: OG tube with tip overlying the mid stomach. Electronically Signed   By: Harmon Pier M.D.   On: 03/12/2016 15:14    ASSESSMENT AND PLAN  1. Stroke - he remains with severe deficits and overall prognosis is very poor. I have discussed hospice care with the family. They would like to have him transferred home with home hospice. Will contact our hospice service.  2. Atrial fib - his ventricular rate is controlled. He is not a candidate for anti-coagulation. 3. Complete heart block - he is s/p PPM with normal PPM function. 4. Disposition - likely transfer to either home hospice or inpatient hospice (if this is available in [place])  [place] Hymie Gorr,M.D.  Yosiel Thieme,M.D.  03-18-2016 7:44 AM

## 2016-03-29 NOTE — Progress Notes (Signed)
Called social worker about hospice and patient estimated time of discharge to home.  Hermina BartersBOWMAN, Nalleli Largent M, RN

## 2016-03-29 DEATH — deceased

## 2016-06-27 IMAGING — CR DG FOOT COMPLETE 3+V*L*
3 series · 3 of 3 positions shown · non-contrast
Comparison: None.

CLINICAL DATA: Heel pain for 6 months, history of remote injury,
initial encounter

EXAM:
LEFT FOOT - COMPLETE 3+ VIEW

[foot ap]
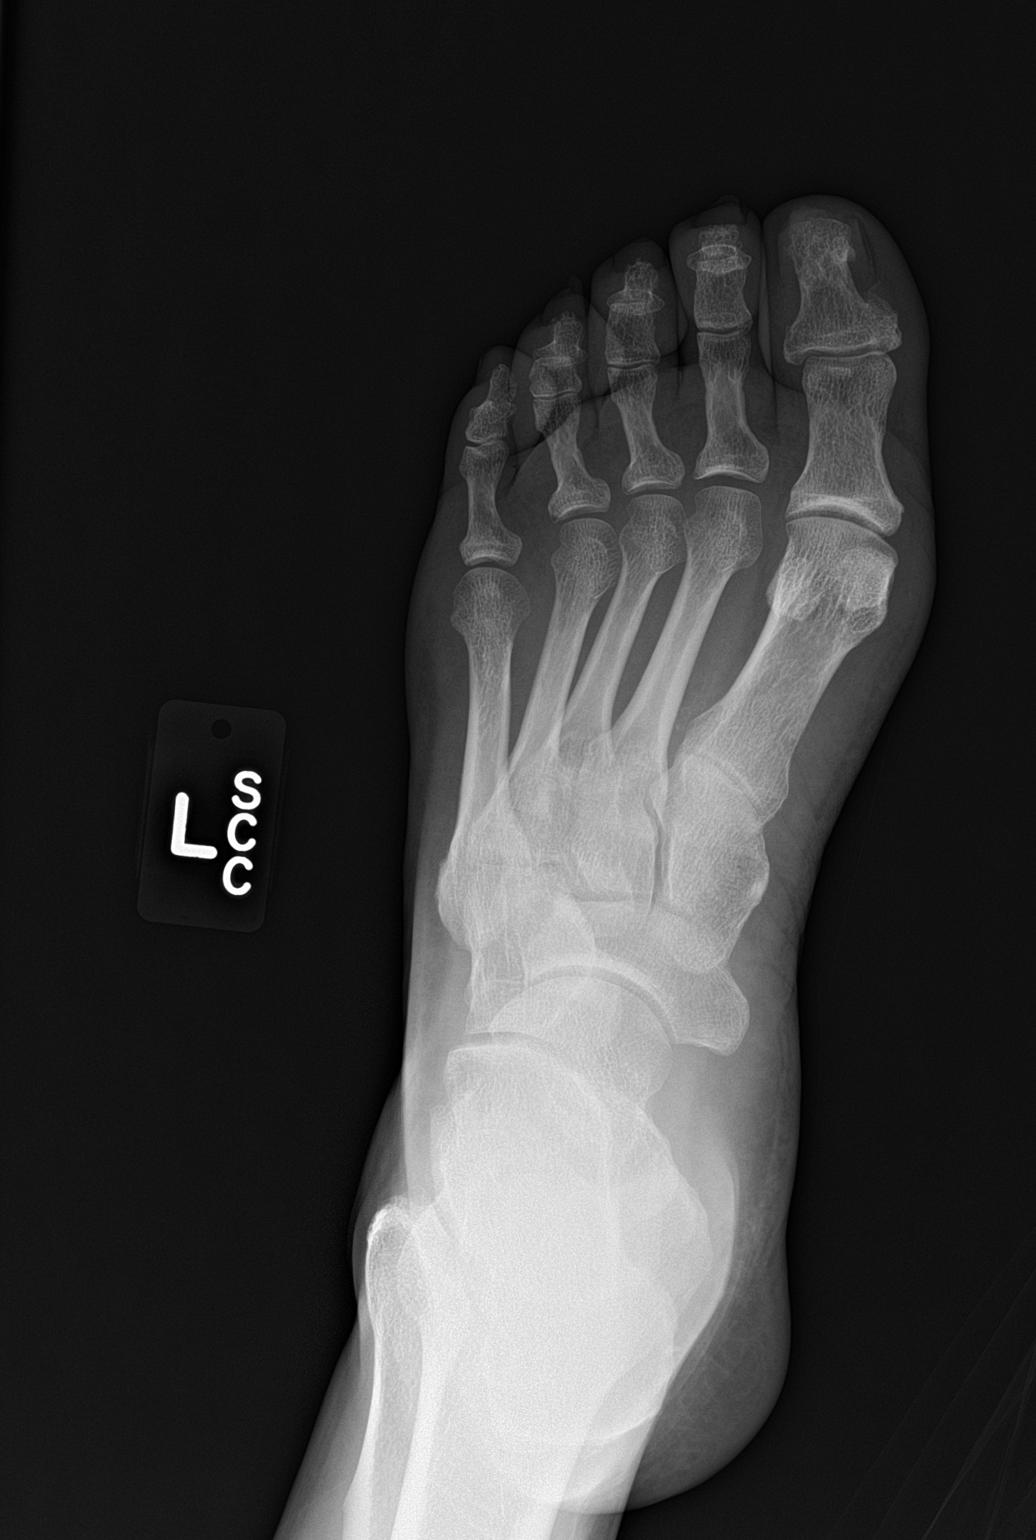

[foot obl]
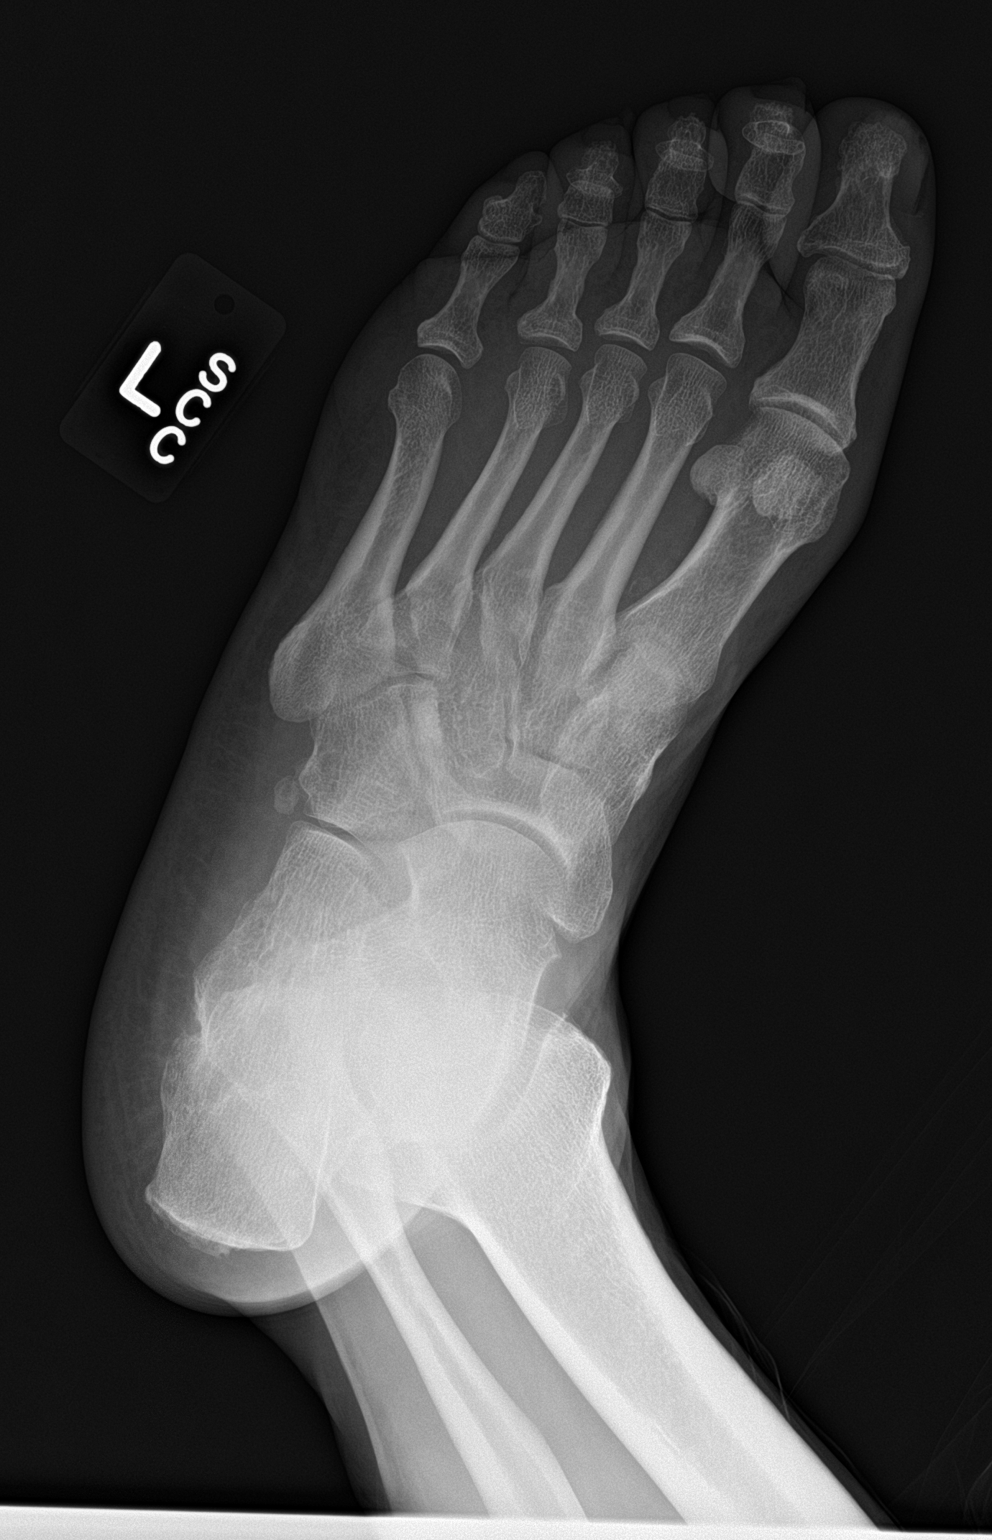

[foot lat]
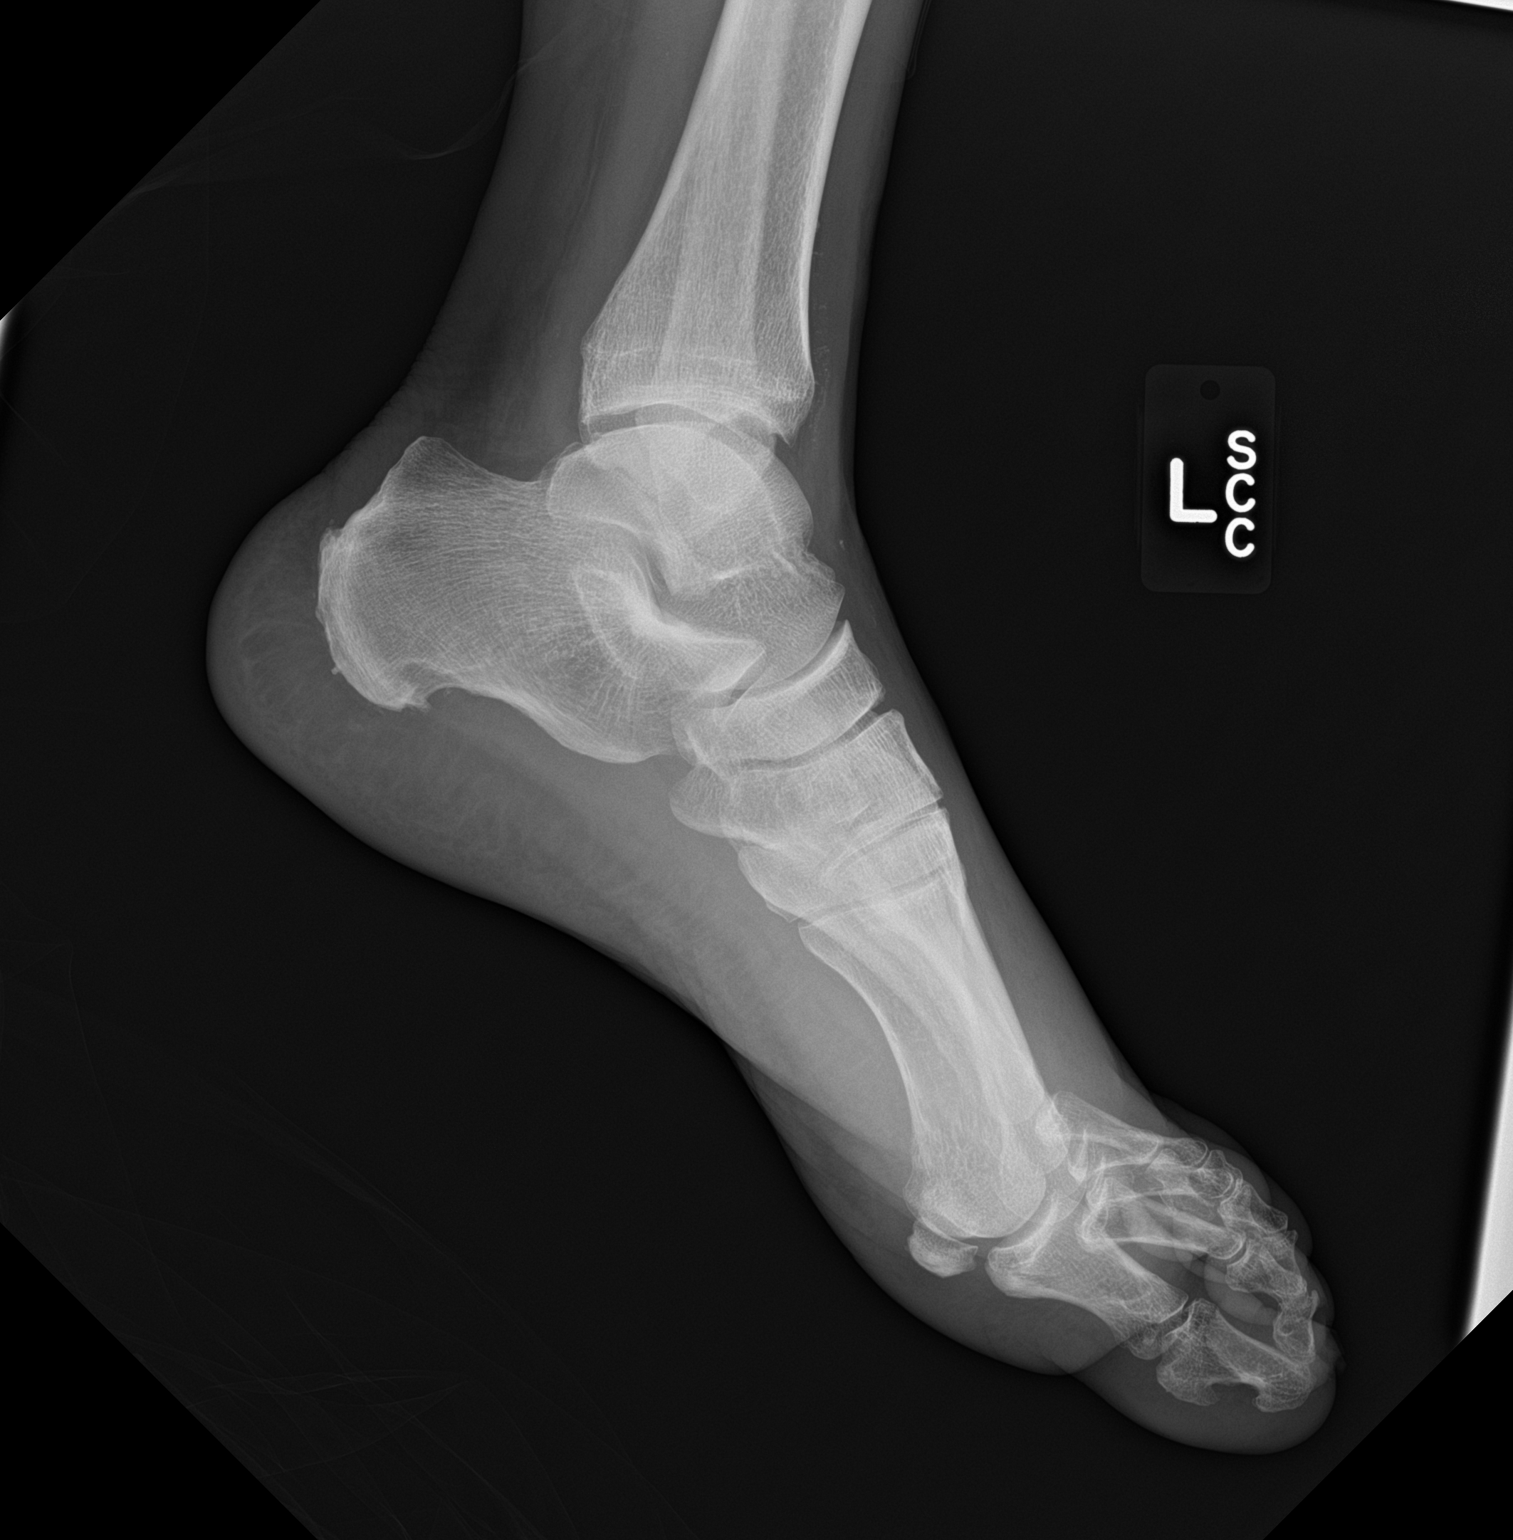

[3 of 3 positions shown; findings below may reference images not displayed]

FINDINGS: Small calcaneal spurs are noted. No acute fracture or dislocation is
seen. No gross soft tissue abnormality is noted.
IMPRESSION: Small calcaneal spurs.  No acute abnormality noted.
